# Patient Record
Sex: Female | Born: 1990 | Race: Black or African American | Hispanic: No | Marital: Single | Smoking: Never smoker
Health system: Southern US, Community
[De-identification: ages and names within clinical notes are randomized; demographics above are authoritative.]

## PROBLEM LIST (undated history)

## (undated) DIAGNOSIS — K5190 Ulcerative colitis, unspecified, without complications: Secondary | ICD-10-CM

## (undated) DIAGNOSIS — D649 Anemia, unspecified: Secondary | ICD-10-CM

## (undated) DIAGNOSIS — F909 Attention-deficit hyperactivity disorder, unspecified type: Secondary | ICD-10-CM

## (undated) DIAGNOSIS — N83209 Unspecified ovarian cyst, unspecified side: Secondary | ICD-10-CM

## (undated) DIAGNOSIS — K219 Gastro-esophageal reflux disease without esophagitis: Secondary | ICD-10-CM

## (undated) DIAGNOSIS — N809 Endometriosis, unspecified: Secondary | ICD-10-CM

## (undated) DIAGNOSIS — F32A Depression, unspecified: Secondary | ICD-10-CM

## (undated) DIAGNOSIS — F419 Anxiety disorder, unspecified: Secondary | ICD-10-CM

## (undated) DIAGNOSIS — N390 Urinary tract infection, site not specified: Secondary | ICD-10-CM

## (undated) HISTORY — DX: Ulcerative colitis, unspecified, without complications: K51.90

## (undated) HISTORY — PX: HEMORRHOID BANDING: SHX5850

## (undated) HISTORY — DX: Attention-deficit hyperactivity disorder, unspecified type: F90.9

## (undated) HISTORY — PX: THERAPEUTIC ABORTION: SHX798

## (undated) HISTORY — DX: Endometriosis, unspecified: N80.9

## (undated) HISTORY — PX: NO PAST SURGERIES: SHX2092

---

## 2011-01-01 ENCOUNTER — Emergency Department (HOSPITAL_COMMUNITY)
Admission: EM | Admit: 2011-01-01 | Discharge: 2011-01-01 | Discharge status: Against Medical Advice | Payer: BC Managed Care – PPO | Attending: Emergency Medicine | Admitting: Emergency Medicine

## 2011-01-01 DIAGNOSIS — R198 Other specified symptoms and signs involving the digestive system and abdomen: Secondary | ICD-10-CM | POA: Insufficient documentation

## 2011-01-16 ENCOUNTER — Ambulatory Visit (INDEPENDENT_AMBULATORY_CARE_PROVIDER_SITE_OTHER): Payer: BC Managed Care – PPO

## 2011-01-16 DIAGNOSIS — Z309 Encounter for contraceptive management, unspecified: Secondary | ICD-10-CM

## 2011-03-14 ENCOUNTER — Ambulatory Visit (INDEPENDENT_AMBULATORY_CARE_PROVIDER_SITE_OTHER): Payer: BC Managed Care – PPO | Admitting: Family Medicine

## 2011-03-14 VITALS — BP 124/76 | HR 72 | Temp 98.0°F | Resp 16 | Ht 64.5 in | Wt 245.2 lb

## 2011-03-14 DIAGNOSIS — Z0000 Encounter for general adult medical examination without abnormal findings: Secondary | ICD-10-CM

## 2011-03-14 LAB — POCT URINALYSIS DIPSTICK
Blood, UA: NEGATIVE
Nitrite, UA: NEGATIVE
Protein, UA: 30
Urobilinogen, UA: 1
pH, UA: 7

## 2011-03-14 LAB — POCT UA - MICROSCOPIC ONLY
Casts, Ur, LPF, POC: NEGATIVE
Crystals, Ur, HPF, POC: NEGATIVE

## 2011-03-14 LAB — POCT URINE PREGNANCY: Preg Test, Ur: NEGATIVE

## 2011-03-14 MED ORDER — NORGESTIMATE-ETH ESTRADIOL 0.25-35 MG-MCG PO TABS: 1.0000 | ORAL_TABLET | Freq: Every day | ORAL | Status: DC

## 2011-03-14 NOTE — Progress Notes (Deleted)
  Subjective:    Patient ID: Robin Mora, female    DOB: [birth year], 21 y.o.   MRN: [id number]  HPI Patient presents for CPE.  See pink intake form(to be scanned)   Review of Systems     Objective:   Physical Exam  Nursing note and vitals reviewed. Constitutional: She is oriented to person, place, and time. She appears well-developed and well-nourished.  HENT:  Head: Normocephalic and atraumatic.  Right Ear: Tympanic membrane and external ear normal.  Left Ear: Tympanic membrane and external ear normal.  Nose: Nose normal.  Mouth/Throat: Oropharynx is clear and moist and mucous membranes are normal.  Eyes: Conjunctivae, EOM and lids are normal. Pupils are equal, round, and reactive to light.  Neck: Normal range of motion. Neck supple. No edema present. No thyromegaly present.  Cardiovascular: Normal rate, regular rhythm, normal heart sounds and normal pulses.   Pulmonary/Chest: Effort normal and breath sounds normal.  Abdominal: Soft. Bowel sounds are normal. There is no tenderness.  Genitourinary:       No breast masses                 Musculoskeletal: Normal range of motion.  Lymphadenopathy:    She has no cervical adenopathy.  Neurological: She is alert and oriented to person, place, and time. She has normal strength and normal reflexes. No sensory deficit. Gait normal.  Skin: Skin is warm and dry.  Psychiatric: She has a normal mood and affect.          Assessment & Plan:  CPE/  PPD, follow up 48-72 hours Sprintec x 1 year Form completion

## 2011-03-14 NOTE — Progress Notes (Signed)
  Subjective:    Patient ID: Robin Mora, female    DOB: 1990/12/05, 20 y.o.   MRN: [id number]  HPI Presents for PE for employment.   Requests birth control(never filled rx for sprintec in 12/12 now unable to locate rx).  Recent STI evaluation negative.  New job requiring form completion and ppd   Review of Systems  Constitutional: Negative.   Respiratory: Negative.   Cardiovascular: Negative.   Gastrointestinal: Negative.   Genitourinary: Negative.   Musculoskeletal: Negative.        Objective:   Physical Exam  Constitutional: She is oriented to person, place, and time. She appears well-developed and well-nourished.  HENT:  Head: Normocephalic and atraumatic.  Right Ear: External ear normal.  Left Ear: External ear normal.  Mouth/Throat: Oropharynx is clear and moist.  Eyes: Conjunctivae and EOM are normal. Pupils are equal, round, and reactive to light.  Neck: Normal range of motion. Neck supple. No thyromegaly present.  Cardiovascular: Normal rate, regular rhythm and normal heart sounds.   Pulmonary/Chest: Effort normal and breath sounds normal.  Abdominal: Soft. Bowel sounds are normal. There is no hepatosplenomegaly. There is no tenderness.  Musculoskeletal: Normal range of motion.  Lymphadenopathy:    She has no cervical adenopathy.  Neurological: She is alert and oriented to person, place, and time.  Skin: Skin is warm and dry.  Psychiatric: She has a normal mood and affect.          Assessment & Plan:  CPE  PPD placed, follow up in 48-72 hr Form completion Sprintec, [place] x 40yr Ant. guidance

## 2011-03-16 ENCOUNTER — Ambulatory Visit (INDEPENDENT_AMBULATORY_CARE_PROVIDER_SITE_OTHER): Payer: BC Managed Care – PPO

## 2011-03-16 DIAGNOSIS — Z111 Encounter for screening for respiratory tuberculosis: Secondary | ICD-10-CM

## 2011-03-16 LAB — TB SKIN TEST: TB Skin Test: NEGATIVE mm

## 2012-09-04 ENCOUNTER — Ambulatory Visit (INDEPENDENT_AMBULATORY_CARE_PROVIDER_SITE_OTHER): Payer: BC Managed Care – PPO | Admitting: Physician Assistant

## 2012-09-04 VITALS — BP 110/74 | HR 59 | Temp 97.9°F | Resp 18 | Ht 64.0 in | Wt 232.0 lb

## 2012-09-04 DIAGNOSIS — N898 Other specified noninflammatory disorders of vagina: Secondary | ICD-10-CM

## 2012-09-04 DIAGNOSIS — Z0000 Encounter for general adult medical examination without abnormal findings: Secondary | ICD-10-CM

## 2012-09-04 DIAGNOSIS — N760 Acute vaginitis: Secondary | ICD-10-CM

## 2012-09-04 DIAGNOSIS — B9689 Other specified bacterial agents as the cause of diseases classified elsewhere: Secondary | ICD-10-CM

## 2012-09-04 LAB — POCT WET PREP WITH KOH
Clue Cells Wet Prep HPF POC: 100
KOH Prep POC: NEGATIVE
Trichomonas, UA: NEGATIVE
Yeast Wet Prep HPF POC: NEGATIVE

## 2012-09-04 MED ORDER — METRONIDAZOLE 500 MG PO TABS: 500.0000 mg | ORAL_TABLET | Freq: Two times a day (BID) | ORAL | Status: DC

## 2012-09-04 NOTE — Progress Notes (Signed)
  Subjective:    Patient ID: Robin Mora, female    DOB: 11/12/1990, 22 y.o.   MRN: [id number]  HPI 22 year old female presents for CPE and TB screening with paperwork completion for work.  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  Plans to commute from [place] but is excited.  No known medical problems. Is requesting a pap today - has had one on the past but believes it has been at least 1 year.  No other concerns or complaints today.      Review of Systems  Constitutional: Negative.   HENT: Negative.   Eyes: Negative.   Respiratory: Negative.   Cardiovascular: Negative.   Gastrointestinal: Negative.   Endocrine: Negative.   Genitourinary: Negative.   Musculoskeletal: Negative.   Skin: Negative.   Allergic/Immunologic: Negative.   Neurological: Negative.   Hematological: Negative.   Psychiatric/Behavioral: Negative.        Objective:   Physical Exam  Constitutional: She is oriented to person, place, and time. She appears well-developed and well-nourished.  HENT:  Head: Normocephalic and atraumatic.  Right Ear: Hearing, tympanic membrane, external ear and ear canal normal.  Left Ear: Hearing, tympanic membrane, external ear and ear canal normal.  Mouth/Throat: Oropharynx is clear and moist. No oropharyngeal exudate.  Eyes: Conjunctivae and EOM are normal. Pupils are equal, round, and reactive to light.  Neck: Neck supple. No thyromegaly present.  Cardiovascular: Normal rate and normal heart sounds.   Pulmonary/Chest: Effort normal and breath sounds normal.  Abdominal: Soft. Bowel sounds are normal. There is no tenderness. There is no rebound and no guarding.  Genitourinary: Vagina normal and uterus normal. Pelvic exam was performed with patient supine. Cervix exhibits discharge (thin, white). Cervix exhibits no motion tenderness and no friability. Right adnexum displays no mass, no tenderness and no fullness. Left adnexum displays no mass,  no tenderness and no fullness.  Lymphadenopathy:    She has no cervical adenopathy.  Neurological: She is alert and oriented to person, place, and time.  Psychiatric: She has a normal mood and affect. Her behavior is normal. Judgment and thought content normal.      Results for orders placed in visit on 09/04/12  POCT WET PREP WITH KOH      Result Value Range   Trichomonas, UA Negative     Clue Cells Wet Prep HPF POC 100%     Epithelial Wet Prep HPF POC 5-20     Yeast Wet Prep HPF POC neg     Bacteria Wet Prep HPF POC 4+     RBC Wet Prep HPF POC 0-1     WBC Wet Prep HPF POC 0-2     KOH Prep POC Negative         Assessment & Plan:  Routine general medical examination at a health care facility - Plan: Pap IG, CT/NG w/ reflex HPV when ASC-U  Leukorrhea, not specified as infective - Plan: POCT Wet Prep with KOH  Bacterial vaginosis - Plan: metroNIDAZOLE (FLAGYL) 500 MG tablet  Forms completed TB screening letter printed Immunizations up to date For BV, Flagyl 500 mg bid x 7 days - no ETOH Follow up as needed.

## 2012-09-04 NOTE — Progress Notes (Signed)
  Tuberculosis Risk Questionnaire  1. Were you born outside the [place] in one of the following parts of the world: [place], [place], [place], [place] or Eastern [place]?  No  2. Have you traveled outside the [place] and lived for more than one month in one of the following parts of the world: [place], [place], [place], [place] or [place]?  No  3. Do you have a compromised immune system such as from any of the following conditions:HIV/AIDS, organ or bone marrow transplantation, diabetes, immunosuppressive medicines (e.g. Prednisone, Remicaide), leukemia, lymphoma, cancer of the head or neck, gastrectomy or jejunal bypass, end-stage renal disease (on dialysis), or silicosis?  No   4. Have you ever or do you plan on working in: a residential care center, a health care facility, a jail or prison or homeless shelter?  No  5. Have you ever: injected illegal drugs, used crack cocaine, lived in a homeless shelter  or been in jail or prison?   No  6. Have you ever been exposed to anyone with infectious tuberculosis?  No   Tuberculosis Symptom Questionnaire  Do you currently have any of the following symptoms?  1. Unexplained cough lasting more than 3 weeks? No  2. Unexplained fever lasting more than 3 weeks. No   3. Night Sweats (sweating that leaves the bedclothes and sheets wet)   No  4. Shortness of Breath No  5. Chest Pain No  6. Unintentional weight loss  No  7. Unexplained fatigue (very tired for no reason) No  

## 2012-09-05 LAB — PAP IG, CT-NG, RFX HPV ASCU
Chlamydia Probe Amp: NEGATIVE
GC Probe Amp: NEGATIVE

## 2012-09-20 ENCOUNTER — Ambulatory Visit (INDEPENDENT_AMBULATORY_CARE_PROVIDER_SITE_OTHER): Payer: BC Managed Care – PPO | Admitting: Physician Assistant

## 2012-09-20 VITALS — BP 118/74 | HR 89 | Temp 98.0°F | Resp 17 | Ht 65.5 in | Wt 228.0 lb

## 2012-09-20 DIAGNOSIS — B373 Candidiasis of vulva and vagina: Secondary | ICD-10-CM

## 2012-09-20 DIAGNOSIS — N899 Noninflammatory disorder of vagina, unspecified: Secondary | ICD-10-CM

## 2012-09-20 DIAGNOSIS — N898 Other specified noninflammatory disorders of vagina: Secondary | ICD-10-CM

## 2012-09-20 LAB — POCT WET PREP WITH KOH: Yeast Wet Prep HPF POC: NEGATIVE

## 2012-09-20 MED ORDER — FLUCONAZOLE 150 MG PO TABS: 150.0000 mg | ORAL_TABLET | Freq: Once | ORAL | Status: DC

## 2012-09-20 NOTE — Progress Notes (Signed)
  Subjective:    Patient ID: Robin Mora, female    DOB: [birth year], 22 y.o.   MRN: [id number]  HPI   Robin Mora is a very pleasant 22 yr old female here with concern for yeast infection.  Currently experiencing thick, white vaginal discharge associated with irritation, itching, and burning.  She has had yeast infections in the past, and this feels similar to previous episodes.  She was seen here approx 2 wks ago for BV.  Noticed current symptoms a day or so after she completed abx fo BV.  Tried OTC cream but that caused further irritation and did not resolve symptoms.    LMP 08/25/12.  No concern for STI.  Pap at last visit neg for CT/NG  Review of Systems  Constitutional: Negative.   HENT: Negative.   Respiratory: Negative.   Cardiovascular: Negative.   Gastrointestinal: Negative for nausea, vomiting and abdominal pain.  Genitourinary: Positive for vaginal discharge. Negative for dysuria, frequency, hematuria, vaginal bleeding and menstrual problem.  Musculoskeletal: Negative.   Skin: Negative.   Neurological: Negative.        Objective:   Physical Exam  Vitals reviewed. Constitutional: She is oriented to person, place, and time. She appears well-developed and well-nourished. No distress.  HENT:  Head: Normocephalic and atraumatic.  Eyes: Conjunctivae are normal. No scleral icterus.  Cardiovascular: Normal rate, regular rhythm and normal heart sounds.   Pulmonary/Chest: Effort normal and breath sounds normal. She has no wheezes. She has no rales.  Abdominal: Soft. There is no tenderness.  Neurological: She is alert and oriented to person, place, and time.  Skin: Skin is warm and dry.  Psychiatric: She has a normal mood and affect. Her behavior is normal.    Self-collected wet prep:  Results for orders placed in visit on 09/20/12  POCT WET PREP WITH KOH      Result Value Range   Trichomonas, UA Negative     Clue Cells Wet Prep HPF POC 1-2     Epithelial Wet Prep HPF POC 10-tntc      Yeast Wet Prep HPF POC neg     Bacteria Wet Prep HPF POC 3+     RBC Wet Prep HPF POC neg     WBC Wet Prep HPF POC 0-4     KOH Prep POC Negative         Assessment & Plan:  Vaginal candidiasis - Plan: fluconazole (DIFLUCAN) 150 MG tablet  Vaginal discharge - Plan: POCT Wet Prep with KOH  Vaginal irritation - Plan: POCT Wet Prep with KOH   Robin Mora is a very pleasant 22 yr old female with vaginal candidiasis.  Wet prep does not show yeast, but symptoms are certainly suggestive of yeast infection, esp with recent completion of abx.  Will treat with diflucan x 1, repeat in 1wk if needed.  Discussed RTC precautions with pt who understands and is in agreement with this plan.

## 2012-09-20 NOTE — Patient Instructions (Addendum)
Take one fluconazole pill today.  Repeat in 1 wk if needed.  Please let me know if symptoms are worsening or not improving.   Monilial Vaginitis Vaginitis in a soreness, swelling and redness (inflammation) of the vagina and vulva. Monilial vaginitis is not a sexually transmitted infection. CAUSES  Yeast vaginitis is caused by yeast (candida) that is normally found in your vagina. With a yeast infection, the candida has overgrown in number to a point that upsets the chemical balance. SYMPTOMS   White, thick vaginal discharge.  Swelling, itching, redness and irritation of the vagina and possibly the lips of the vagina (vulva).  Burning or painful urination.  Painful intercourse. DIAGNOSIS  Things that may contribute to monilial vaginitis are:  Postmenopausal and virginal states.  Pregnancy.  Infections.  Being tired, sick or stressed, especially if you had monilial vaginitis in the past.  Diabetes. Good control will help lower the chance.  Birth control pills.  Tight fitting garments.  Using bubble bath, feminine sprays, douches or deodorant tampons.  Taking certain medications that kill germs (antibiotics).  Sporadic recurrence can occur if you become ill. TREATMENT  Your caregiver will give you medication.  There are several kinds of anti monilial vaginal creams and suppositories specific for monilial vaginitis. For recurrent yeast infections, use a suppository or cream in the vagina 2 times a week, or as directed.  Anti-monilial or steroid cream for the itching or irritation of the vulva may also be used. Get your caregiver's permission.  Painting the vagina with methylene blue solution may help if the monilial cream does not work.  Eating yogurt may help prevent monilial vaginitis. HOME CARE INSTRUCTIONS   Finish all medication as prescribed.  Do not have sex until treatment is completed or after your caregiver tells you it is okay.  Take warm sitz baths.  Do  not douche.  Do not use tampons, especially scented ones.  Wear cotton underwear.  Avoid tight pants and panty hose.  Tell your sexual partner that you have a yeast infection. They should go to their caregiver if they have symptoms such as mild rash or itching.  Your sexual partner should be treated as well if your infection is difficult to eliminate.  Practice safer sex. Use condoms.  Some vaginal medications cause latex condoms to fail. Vaginal medications that harm condoms are:  Cleocin cream.  Butoconazole (Femstat).  Terconazole (Terazol) vaginal suppository.  Miconazole (Monistat) (may be purchased over the counter). SEEK MEDICAL CARE IF:   You have a temperature by mouth above 102 F (38.9 C).  The infection is getting worse after 2 days of treatment.  The infection is not getting better after 3 days of treatment.  You develop blisters in or around your vagina.  You develop vaginal bleeding, and it is not your menstrual period.  You have pain when you urinate.  You develop intestinal problems.  You have pain with sexual intercourse. Document Released: 11/01/2004 Document Revised: 04/16/2011 Document Reviewed: 07/16/2008 [facility] Patient Information 2014 [place], [place].

## 2012-10-11 ENCOUNTER — Ambulatory Visit (INDEPENDENT_AMBULATORY_CARE_PROVIDER_SITE_OTHER): Payer: BC Managed Care – PPO | Admitting: Internal Medicine

## 2012-10-11 VITALS — BP 118/72 | HR 78 | Temp 98.1°F | Resp 18 | Ht 64.5 in | Wt 226.0 lb

## 2012-10-11 DIAGNOSIS — N390 Urinary tract infection, site not specified: Secondary | ICD-10-CM

## 2012-10-11 DIAGNOSIS — R300 Dysuria: Secondary | ICD-10-CM

## 2012-10-11 LAB — POCT URINALYSIS DIPSTICK
Bilirubin, UA: NEGATIVE
Glucose, UA: NEGATIVE
Ketones, UA: NEGATIVE
Spec Grav, UA: 1.025
Urobilinogen, UA: 2

## 2012-10-11 MED ORDER — CIPROFLOXACIN HCL 500 MG PO TABS: 500.0000 mg | ORAL_TABLET | Freq: Two times a day (BID) | ORAL | Status: DC

## 2012-10-11 MED ORDER — PHENAZOPYRIDINE HCL 200 MG PO TABS: 200.0000 mg | ORAL_TABLET | Freq: Three times a day (TID) | ORAL | Status: DC | PRN

## 2012-10-11 NOTE — Patient Instructions (Signed)
Urinary Tract Infection  Urinary tract infections (UTIs) can develop anywhere along your urinary tract. Your urinary tract is your body's drainage system for removing wastes and extra water. Your urinary tract includes two kidneys, two ureters, a bladder, and a urethra. Your kidneys are a pair of bean-shaped organs. Each kidney is about the size of your fist. They are located below your ribs, one on each side of your spine.  CAUSES  Infections are caused by microbes, which are microscopic organisms, including fungi, viruses, and bacteria. These organisms are so small that they can only be seen through a microscope. Bacteria are the microbes that most commonly cause UTIs.  SYMPTOMS   Symptoms of UTIs may vary by age and gender of the patient and by the location of the infection. Symptoms in young women typically include a frequent and intense urge to urinate and a painful, burning feeling in the bladder or urethra during urination. Older women and men are more likely to be tired, shaky, and weak and have muscle aches and abdominal pain. A fever may mean the infection is in your kidneys. Other symptoms of a kidney infection include pain in your back or sides below the ribs, nausea, and vomiting.  DIAGNOSIS  To diagnose a UTI, your caregiver will ask you about your symptoms. Your caregiver also will ask to provide a urine sample. The urine sample will be tested for bacteria and white blood cells. White blood cells are made by your body to help fight infection.  TREATMENT   Typically, UTIs can be treated with medication. Because most UTIs are caused by a bacterial infection, they usually can be treated with the use of antibiotics. The choice of antibiotic and length of treatment depend on your symptoms and the type of bacteria causing your infection.  HOME CARE INSTRUCTIONS   If you were prescribed antibiotics, take them exactly as your caregiver instructs you. Finish the medication even if you feel better after you  have only taken some of the medication.   Drink enough water and fluids to keep your urine clear or pale yellow.   Avoid caffeine, tea, and carbonated beverages. They tend to irritate your bladder.   Empty your bladder often. Avoid holding urine for long periods of time.   Empty your bladder before and after sexual intercourse.   After a bowel movement, women should cleanse from front to back. Use each tissue only once.  SEEK MEDICAL CARE IF:    You have back pain.   You develop a fever.   Your symptoms do not begin to resolve within 3 days.  SEEK IMMEDIATE MEDICAL CARE IF:    You have severe back pain or lower abdominal pain.   You develop chills.   You have nausea or vomiting.   You have continued burning or discomfort with urination.  MAKE SURE YOU:    Understand these instructions.   Will watch your condition.   Will get help right away if you are not doing well or get worse.  Document Released: 11/01/2004 Document Revised: 07/24/2011 Document Reviewed: 03/02/2011  [employer] Patient Information 2014 [employer].

## 2012-10-11 NOTE — Progress Notes (Signed)
  Subjective:    Patient ID: Robin Mora, female    DOB: 1990-10-14, 22 y.o.   MRN: [id number]  HPI Burning , frequency, urgency for 2 days. No fever, chills, or back pain Hx of only a few utis.   Review of Systems     Objective:   Physical Exam  Vitals reviewed. Constitutional: She is oriented to person, place, and time. She appears well-developed and well-nourished. No distress.  Eyes: No scleral icterus.  Pulmonary/Chest: Effort normal.  Abdominal: There is tenderness.  Neurological: She is alert and oriented to person, place, and time. She exhibits normal muscle tone. Coordination normal.  Skin: No rash noted.  Psychiatric: She has a normal mood and affect.     Results for orders placed in visit on 10/11/12  POCT URINALYSIS DIPSTICK      Result Value Range   Color, UA yellow     Clarity, UA clear     Glucose, UA neg     Bilirubin, UA neg     Ketones, UA neg     Spec Grav, UA 1.025     Blood, UA large     pH, UA 8.0     Protein, UA >=300     Urobilinogen, UA 2.0     Nitrite, UA neg     Leukocytes, UA small (1+)      CS urine    Assessment & Plan:  UTI/Cipro

## 2012-10-13 LAB — URINE CULTURE: Colony Count: 95000

## 2012-12-11 ENCOUNTER — Other Ambulatory Visit: Payer: Self-pay

## 2013-01-06 ENCOUNTER — Encounter: Payer: Self-pay | Admitting: Physician Assistant

## 2013-01-06 ENCOUNTER — Ambulatory Visit (INDEPENDENT_AMBULATORY_CARE_PROVIDER_SITE_OTHER): Payer: BC Managed Care – PPO | Admitting: Physician Assistant

## 2013-01-06 VITALS — BP 100/72 | HR 84 | Temp 98.9°F | Resp 16 | Ht 65.0 in | Wt 222.6 lb

## 2013-01-06 DIAGNOSIS — B9689 Other specified bacterial agents as the cause of diseases classified elsewhere: Secondary | ICD-10-CM

## 2013-01-06 DIAGNOSIS — Z113 Encounter for screening for infections with a predominantly sexual mode of transmission: Secondary | ICD-10-CM

## 2013-01-06 DIAGNOSIS — N898 Other specified noninflammatory disorders of vagina: Secondary | ICD-10-CM

## 2013-01-06 DIAGNOSIS — N760 Acute vaginitis: Secondary | ICD-10-CM

## 2013-01-06 LAB — POCT WET PREP WITH KOH
RBC Wet Prep HPF POC: NEGATIVE
Trichomonas, UA: NEGATIVE

## 2013-01-06 MED ORDER — METRONIDAZOLE 500 MG PO TABS: 500.0000 mg | ORAL_TABLET | Freq: Two times a day (BID) | ORAL | Status: DC

## 2013-01-06 NOTE — Patient Instructions (Signed)
Take the metronidazole as directed.  DO NOT DRINK ALCOHOL WHILE TAKING THIS MEDICATION or for 48 hours after your last dose.  Be careful with soaps, lotions, scented products, etc as these can trigger BV.  Please let us know if any symptoms are worsening or not improving.    Think about trying Nexplanon for birth control - I can put in a referral to OBGYN if you are interested in this.  At the very least, use condoms with EVERY encounter  I will let you know when the rest of your labs are back and if we need treat anything at that time   Bacterial Vaginosis Bacterial vaginosis (BV) is a vaginal infection where the normal balance of bacteria in the vagina is disrupted. The normal balance is then replaced by an overgrowth of certain bacteria. There are several different kinds of bacteria that can cause BV. BV is the most common vaginal infection in women of childbearing age. CAUSES   The cause of BV is not fully understood. BV develops when there is an increase or imbalance of harmful bacteria.  Some activities or behaviors can upset the normal balance of bacteria in the vagina and put women at increased risk including:  Having a new sex partner or multiple sex partners.  Douching.  Using an intrauterine device (IUD) for contraception.  It is not clear what role sexual activity plays in the development of BV. However, women that have never had sexual intercourse are rarely infected with BV. Women do not get BV from toilet seats, bedding, swimming pools or from touching objects around them.  SYMPTOMS   Grey vaginal discharge.  A fish-like odor with discharge, especially after sexual intercourse.  Itching or burning of the vagina and vulva.  Burning or pain with urination.  Some women have no signs or symptoms at all. DIAGNOSIS  Your caregiver must examine the vagina for signs of BV. Your caregiver will perform lab tests and look at the sample of vaginal fluid through a microscope.  They will look for bacteria and abnormal cells (clue cells), a pH test higher than 4.5, and a positive amine test all associated with BV.  RISKS AND COMPLICATIONS   Pelvic inflammatory disease (PID).  Infections following gynecology surgery.  Developing HIV.  Developing herpes virus. TREATMENT  Sometimes BV will clear up without treatment. However, all women with symptoms of BV should be treated to avoid complications, especially if gynecology surgery is planned. Female partners generally do not need to be treated. However, BV may spread between female sex partners so treatment is helpful in preventing a recurrence of BV.   BV may be treated with antibiotics. The antibiotics come in either pill or vaginal cream forms. Either can be used with nonpregnant or pregnant women, but the recommended dosages differ. These antibiotics are not harmful to the baby.  BV can recur after treatment. If this happens, a second round of antibiotics will often be prescribed.  Treatment is important for pregnant women. If not treated, BV can cause a premature delivery, especially for a pregnant woman who had a premature birth in the past. All pregnant women who have symptoms of BV should be checked and treated.  For chronic reoccurrence of BV, treatment with a type of prescribed gel vaginally twice a week is helpful. HOME CARE INSTRUCTIONS   Finish all medication as directed by your caregiver.  Do not have sex until treatment is completed.  Tell your sexual partner that you have a vaginal infection.  They should see their caregiver and be treated if they have problems, such as a mild rash or itching.  Practice safe sex. Use condoms. Only have 1 sex partner. PREVENTION  Basic prevention steps can help reduce the risk of upsetting the natural balance of bacteria in the vagina and developing BV:  Do not have sexual intercourse (be abstinent).  Do not douche.  Use all of the medicine prescribed for treatment  of BV, even if the signs and symptoms go away.  Tell your sex partner if you have BV. That way, they can be treated, if needed, to prevent reoccurrence. SEEK MEDICAL CARE IF:   Your symptoms are not improving after 3 days of treatment.  You have increased discharge, pain, or fever. MAKE SURE YOU:   Understand these instructions.  Will watch your condition.  Will get help right away if you are not doing well or get worse. FOR MORE INFORMATION  Division of STD Prevention (DSTDP), Centers for Disease Control and Prevention: [employer] American Social Health Association (ASHA): www.ashastd.org  Document Released: 01/22/2005 Document Revised: 04/16/2011 Document Reviewed: 09/03/2012 [facility] Patient Information 2014 [place], [place].

## 2013-01-06 NOTE — Progress Notes (Signed)
Subjective:    Patient ID: Robin Mora, female    DOB: May 11, 1990, 22 y.o.   MRN: [id number]  HPI   Ms. Mcquilkin is a very pleasant 22 yr old female here requesting STD testing.  Reports some vaginal discharge, irritation, and odor.  Noted this over 2 wks ago when she had her period.  Patient's last menstrual period was 12/18/2012.  Denies NV, fever, abd pain, abnormal bleeding, urinary symptoms.  Has had both yeast and BV in the past.  Last tested for STIs over a year ago, never positive.  She is currently sexually active with 1 female partner.  They do not use condoms.  She is not using anything for contraception - stopped about a year ago.  Had significant weight gain with depo.  Tried sprintec and another pill - both caused breast pain.    Review of Systems  Constitutional: Negative for fever and chills.  Respiratory: Negative.   Cardiovascular: Negative.   Gastrointestinal: Negative for nausea, vomiting and abdominal pain.  Genitourinary: Positive for vaginal discharge and vaginal pain (irritation). Negative for dysuria, frequency and hematuria.  Musculoskeletal: Negative.   Skin: Negative.        Objective:   Physical Exam  Vitals reviewed. Constitutional: She is oriented to person, place, and time. She appears well-developed and well-nourished. No distress.  HENT:  Head: Normocephalic and atraumatic.  Eyes: Conjunctivae are normal. No scleral icterus.  Pulmonary/Chest: Effort normal.  Genitourinary: Uterus normal. There is no rash, tenderness or lesion on the right labia. There is no rash, tenderness or lesion on the left labia. Cervix exhibits no motion tenderness. Right adnexum displays no mass, no tenderness and no fullness. Left adnexum displays no mass, no tenderness and no fullness. Vaginal discharge (thick, white) found.  Neurological: She is alert and oriented to person, place, and time.  Skin: Skin is warm and dry.  Psychiatric: She has a normal mood and affect. Her behavior is  normal.    Results for orders placed in visit on 01/06/13  POCT WET PREP WITH KOH      Result Value Range   Trichomonas, UA Negative     Clue Cells Wet Prep HPF POC 4-6     Epithelial Wet Prep HPF POC 6-12     Yeast Wet Prep HPF POC neg     Bacteria Wet Prep HPF POC 2+     RBC Wet Prep HPF POC neg     WBC Wet Prep HPF POC 2-10     KOH Prep POC Negative         Assessment & Plan:  Bacterial vaginosis - Plan: metroNIDAZOLE (FLAGYL) 500 MG tablet  Vaginal discharge - Plan: POCT Wet Prep with KOH, GC/Chlamydia Probe Amp  Screen for STD (sexually transmitted disease) - Plan: RPR, HIV antibody   Ms. Weinfeld is a very pleasant 22 yr old female here with bacterial vaginosis.  Will treat with flagyl x 7 days.  Discussed possible triggers of BV and prevention strategies.  Genprobe, HIV, RPR pending - will follow up and treat if necessary.  We discussed contraceptive options.  I think Nexplanon would be a very good option for her as she has had intolerable effects from other methods.  Gave pt edu material about nexplanon, she will call if she would like me to place a referral to OBGYN.   Meds ordered this encounter  Medications  . metroNIDAZOLE (FLAGYL) 500 MG tablet    Sig: Take 1 tablet (500 mg total)  by mouth 2 (two) times daily with a meal. DO NOT CONSUME ALCOHOL WHILE TAKING THIS MEDICATION.    Dispense:  14 tablet    Refill:  0    Order Specific Question:  Supervising Provider    Answer:  Ethelda Chick [[id number]]    Loleta Dicker MHS, PA-C [facility] 12/2/20148:00 PM

## 2013-01-07 LAB — GC/CHLAMYDIA PROBE AMP
CT Probe RNA: NEGATIVE
GC Probe RNA: NEGATIVE

## 2013-01-07 LAB — HIV ANTIBODY (ROUTINE TESTING W REFLEX): HIV: NONREACTIVE

## 2013-01-07 LAB — RPR

## 2013-05-07 ENCOUNTER — Encounter: Payer: Self-pay | Admitting: Family Medicine

## 2013-05-07 ENCOUNTER — Ambulatory Visit (INDEPENDENT_AMBULATORY_CARE_PROVIDER_SITE_OTHER): Payer: BC Managed Care – PPO | Admitting: Family Medicine

## 2013-05-07 VITALS — BP 118/70 | HR 71 | Temp 99.3°F | Resp 16 | Ht 64.5 in | Wt 235.6 lb

## 2013-05-07 DIAGNOSIS — N62 Hypertrophy of breast: Secondary | ICD-10-CM | POA: Insufficient documentation

## 2013-05-07 DIAGNOSIS — E669 Obesity, unspecified: Secondary | ICD-10-CM | POA: Insufficient documentation

## 2013-05-07 DIAGNOSIS — Z130 Encounter for screening for diseases of the blood and blood-forming organs and certain disorders involving the immune mechanism: Secondary | ICD-10-CM

## 2013-05-07 HISTORY — DX: Hypertrophy of breast: N62

## 2013-05-07 LAB — CBC WITH DIFFERENTIAL/PLATELET
BASOS PCT: 1 % (ref 0–1)
Basophils Absolute: 0 10*3/uL (ref 0.0–0.1)
EOS ABS: 0.1 10*3/uL (ref 0.0–0.7)
Eosinophils Relative: 3 % (ref 0–5)
HEMATOCRIT: 43.5 % (ref 36.0–46.0)
HEMOGLOBIN: 14.5 g/dL (ref 12.0–15.0)
Lymphocytes Relative: 35 % (ref 12–46)
Lymphs Abs: 1.5 10*3/uL (ref 0.7–4.0)
MCH: 28.8 pg (ref 26.0–34.0)
MCHC: 33.3 g/dL (ref 30.0–36.0)
MCV: 86.3 fL (ref 78.0–100.0)
MONO ABS: 0.4 10*3/uL (ref 0.1–1.0)
MONOS PCT: 8 % (ref 3–12)
Neutro Abs: 2.3 10*3/uL (ref 1.7–7.7)
Neutrophils Relative %: 53 % (ref 43–77)
Platelets: 346 10*3/uL (ref 150–400)
RBC: 5.04 MIL/uL (ref 3.87–5.11)
RDW: 13.5 % (ref 11.5–15.5)
WBC: 4.4 10*3/uL (ref 4.0–10.5)

## 2013-05-07 NOTE — Progress Notes (Signed)
S:  This 23 y.o. AA female is here for advice re: breast reduction surgery. She started to develop breasts in the 5th grade and had large breasts by the time she was in high school. She has tried weight loss, restricting her calories to 600 per day. 20-pound weight loss Exercise is prohibited by breast size; running or high impact aerobics results in day-after muscle and breasts soreness. She cannot find a sports bra or bathing suit that fits comfortably. Bra size= G. She has low back pain and skin discoloration under breasts and across shoulders where bra straps rest.  PMHx, Surg Hx, Soc and Fam Hx reviewed.  ROS: As per HPI. Negative for fatigue, diaphoresis, CP or tightness, palpitations, SOB or DOE, cold or heat intolerance, HA, dizziness, weakness or syncope.  O: Filed Vitals:   05/07/13 1050  BP: 118/70  Pulse: 71  Temp: 99.3 F (37.4 C)  Resp: 16   GEN: In NAD; WN,WD. HENT: [place]/AT; EOMI w/ clear conj/sclerae. Otherwise unremarkable. NECK: Supple w/o LAN or TMG. COR: RRR. Normal S1 and S2 w/o m/g/r. LUNGS: Normal resp rate and effort. BACK: Spine straight w/ mild paravertebral muscle spasms. SHOULDERS: Indentations where bra straps rest. BREASTS: Large pendulous, hanging over abdomen. Skin discoloration beneath (no signs of infection). NEURO: A&O x 3; CNs intact. Nonfocal. PSYCH: Pleasant and calm; attentive w/ good eye contact. Speech is coherent and thought pattern is normal. Judgment is mature and sound.  A/P: Large breasts - Plan: Ambulatory referral to Plastic Surgery  Obesity, unspecified - Plan: COMPLETE METABOLIC PANEL WITH GFR, Vit D  25 hydroxy (rtn osteoporosis monitoring), Thyroid Panel With TSH  Screening for deficiency anemia - Plan: CBC with Differential

## 2013-05-07 NOTE — Patient Instructions (Signed)
I have ordered a referral to PLASTIC SURGERY for evaluation for breast reduction. Dr. Louisa SecondGerald Truesdale and Dr. Novella OliveWilliam Barber are the two surgeons that come to mind.  Who you see will depend on your insurer. If you have not heard about an appointment within the next 2 weeks, please contact the office.

## 2013-05-08 ENCOUNTER — Encounter: Payer: Self-pay | Admitting: Family Medicine

## 2013-05-08 ENCOUNTER — Other Ambulatory Visit: Payer: Self-pay | Admitting: Family Medicine

## 2013-05-08 LAB — COMPLETE METABOLIC PANEL WITH GFR
ALBUMIN: 4.1 g/dL (ref 3.5–5.2)
ALK PHOS: 55 U/L (ref 39–117)
ALT: 8 U/L (ref 0–35)
AST: 12 U/L (ref 0–37)
BILIRUBIN TOTAL: 0.6 mg/dL (ref 0.2–1.2)
BUN: 11 mg/dL (ref 6–23)
CO2: 27 mEq/L (ref 19–32)
Calcium: 9.4 mg/dL (ref 8.4–10.5)
Chloride: 103 mEq/L (ref 96–112)
Creat: 0.65 mg/dL (ref 0.50–1.10)
GLUCOSE: 88 mg/dL (ref 70–99)
Potassium: 3.9 mEq/L (ref 3.5–5.3)
Sodium: 136 mEq/L (ref 135–145)
Total Protein: 7.3 g/dL (ref 6.0–8.3)

## 2013-05-08 LAB — THYROID PANEL WITH TSH
FREE THYROXINE INDEX: 3.3 (ref 1.0–3.9)
T3 UPTAKE: 37.5 % — AB (ref 22.5–37.0)
T4, Total: 8.7 ug/dL (ref 5.0–12.5)
TSH: 1.276 u[IU]/mL (ref 0.350–4.500)

## 2013-05-08 LAB — VITAMIN D 25 HYDROXY (VIT D DEFICIENCY, FRACTURES): Vit D, 25-Hydroxy: 10 ng/mL — ABNORMAL LOW (ref 30–89)

## 2013-05-08 MED ORDER — ERGOCALCIFEROL 1.25 MG (50000 UT) PO CAPS: 50000.0000 [IU] | ORAL_CAPSULE | ORAL | Status: DC

## 2013-05-13 ENCOUNTER — Telehealth: Payer: Self-pay

## 2013-05-13 MED ORDER — FEXOFENADINE HCL 180 MG PO TABS: 180.0000 mg | ORAL_TABLET | Freq: Every day | ORAL | Status: DC

## 2013-05-13 NOTE — Telephone Encounter (Signed)
  Patient Responses     Chl Mychart After Visit Questionnaire    Question 05/13/2013 8:27 AM   How are you feeling after your recent visit? Happy   Does the recommended course of treatment seem to be helping your symptoms? Sure   Are you experiencing any side effects from your recommended treatment? No   is there anything else you would like to ask your physician? What should I take for my allergies, they are really getting in the way of my daily activities

## 2013-05-13 NOTE — Telephone Encounter (Signed)
I am prescribing allergy medication; it can be purchased OTC but may be less costly w/ prescription. It  Should be available for pick-up later today from pharmacy.  Also advise pt to get OTC saline nasal mist called AYR and follow package directions.

## 2013-05-14 NOTE — Telephone Encounter (Signed)
Left a message for patient to return call.

## 2013-05-18 NOTE — Telephone Encounter (Signed)
Since patient does not return call, have sent my chart message.

## 2013-12-10 ENCOUNTER — Ambulatory Visit (INDEPENDENT_AMBULATORY_CARE_PROVIDER_SITE_OTHER): Payer: BC Managed Care – PPO | Admitting: Emergency Medicine

## 2013-12-10 ENCOUNTER — Other Ambulatory Visit: Payer: Self-pay | Admitting: Emergency Medicine

## 2013-12-10 VITALS — BP 118/70 | HR 86 | Temp 98.3°F | Resp 16 | Ht 65.0 in | Wt 247.0 lb

## 2013-12-10 DIAGNOSIS — B9689 Other specified bacterial agents as the cause of diseases classified elsewhere: Secondary | ICD-10-CM

## 2013-12-10 DIAGNOSIS — R197 Diarrhea, unspecified: Secondary | ICD-10-CM

## 2013-12-10 DIAGNOSIS — R350 Frequency of micturition: Secondary | ICD-10-CM

## 2013-12-10 DIAGNOSIS — N760 Acute vaginitis: Secondary | ICD-10-CM

## 2013-12-10 DIAGNOSIS — A499 Bacterial infection, unspecified: Secondary | ICD-10-CM

## 2013-12-10 DIAGNOSIS — N898 Other specified noninflammatory disorders of vagina: Secondary | ICD-10-CM

## 2013-12-10 LAB — POCT URINALYSIS DIPSTICK
Bilirubin, UA: NEGATIVE
Glucose, UA: NEGATIVE
Leukocytes, UA: NEGATIVE
Nitrite, UA: NEGATIVE
PROTEIN UA: NEGATIVE
RBC UA: NEGATIVE
Spec Grav, UA: >=1.03
UROBILINOGEN UA: 1
pH, UA: 6

## 2013-12-10 LAB — POCT WET PREP WITH KOH
KOH Prep POC: NEGATIVE
RBC WET PREP PER HPF POC: NEGATIVE
TRICHOMONAS UA: NEGATIVE
Yeast Wet Prep HPF POC: NEGATIVE

## 2013-12-10 LAB — POCT UA - MICROSCOPIC ONLY
CASTS, UR, LPF, POC: NEGATIVE
CRYSTALS, UR, HPF, POC: NEGATIVE
MUCUS UA: NEGATIVE
YEAST UA: NEGATIVE

## 2013-12-10 MED ORDER — METRONIDAZOLE 500 MG PO TABS: 500.0000 mg | ORAL_TABLET | Freq: Two times a day (BID) | ORAL | Status: DC

## 2013-12-10 NOTE — Progress Notes (Signed)
[facility] [address], [place] [id number] [phone]  Date:  12/10/2013   Name:  Robin Mora   DOB:  06/11/1990   MRN:  [id number]  PCP:  Nilda SimmerSMITH,KRISTI, MD    Chief Complaint: Urinary Tract Infection and Diarrhea   History of Present Illness:  Robin Mora is a 23 y.o. very pleasant female patient who presents with the following:  Multiple complaints.  Has urgency and frequency.  No dysuria.  No vaginal discharge or dyspareunia Has several month history of diarrhea. The patient has no complaint of blood, mucous, or pus in her stools. Lactose containing products worsens diarrhea.   4-5 times daily. No abdominal pain.  Some urge incontinence of urine. No travel or sketchy water. No fever or chills. Has white thick vaginal discharge No improvement with over the counter medications or other home remedies.  Denies other complaint or health concern today.   Patient Active Problem List   Diagnosis Date Noted  . Large breasts 05/07/2013  . Obesity, unspecified 05/07/2013    History reviewed. No pertinent past medical history.  History reviewed. No pertinent past surgical history.  History  Substance Use Topics  . Smoking status: Never Smoker   . Smokeless tobacco: Not on file  . Alcohol Use: No    Family History  Problem Relation Age of Onset  . Hyperlipidemia Father   . Hypertension Father   . Sleep apnea Father   . Diabetes Maternal Grandmother   . Diabetes Paternal Grandmother   . Hypertension Paternal Grandmother   . Heart disease Paternal Grandfather   . Hypertension Paternal Grandfather   . Kidney disease Paternal Grandfather   . Diabetes Paternal Grandfather     No Known Allergies  Medication list has been reviewed and updated.  Current Outpatient Prescriptions on File Prior to Visit  Medication Sig Dispense Refill  . ergocalciferol (DRISDOL) 50000 UNITS capsule Take 1 capsule (50,000 Units total) by mouth once a week. 4 capsule  5  . fexofenadine (ALLEGRA) 180 MG tablet Take 1 tablet (180 mg total) by mouth daily. 30 tablet 5   No current facility-administered medications on file prior to visit.    Review of Systems:  As per HPI, otherwise negative.    Physical Examination: Filed Vitals:   12/10/13 1758  BP: 118/70  Pulse: 86  Temp: 98.3 F (36.8 C)  Resp: 16   Filed Vitals:   12/10/13 1758  Height: 5\' 5"  (1.651 m)  Weight: 247 lb (112.038 kg)   Body mass index is 41.1 kg/(m^2). Ideal Body Weight: Weight in (lb) to have BMI = 25: 149.9  GEN: obeste, NAD, Non-toxic, A & O x 3 HEENT: Atraumatic, Normocephalic. Neck supple. No masses, No LAD. Ears and Nose: No external deformity. CV: RRR, No M/G/R. No JVD. No thrill. No extra heart sounds. PULM: CTA B, no wheezes, crackles, rhonchi. No retractions. No resp. distress. No accessory muscle use. ABD: S, NT, ND, +BS. No rebound. No HSM. EXTR: No c/c/e NEURO Normal gait.  PSYCH: Normally interactive. Conversant. Not depressed or anxious appearing.  Calm demeanor.    Assessment and Plan: bv Flagyl   Signed,  Phillips OdorJeffery Ariel Dimitri, MD   Results for orders placed or performed in visit on 12/10/13  POCT urinalysis dipstick  Result Value Ref Range   Color, UA yellow    Clarity, UA cloudy    Glucose, UA neg    Bilirubin, UA neg    Ketones, UA trace  Spec Grav, UA >=1.030    Blood, UA neg    pH, UA 6.0    Protein, UA neg    Urobilinogen, UA 1.0    Nitrite, UA neg    Leukocytes, UA Negative   POCT UA - Microscopic Only  Result Value Ref Range   WBC, Ur, HPF, POC 2-3    RBC, urine, microscopic 0-1    Bacteria, U Microscopic small    Mucus, UA neg    Epithelial cells, urine per micros 5-8    Crystals, Ur, HPF, POC neg    Casts, Ur, LPF, POC neg    Yeast, UA neg   POCT Wet Prep with KOH  Result Value Ref Range   Trichomonas, UA Negative    Clue Cells Wet Prep HPF POC 0-2    Epithelial Wet Prep HPF POC 3-6    Yeast Wet Prep HPF POC neg     Bacteria Wet Prep HPF POC 1+    RBC Wet Prep HPF POC neg    WBC Wet Prep HPF POC 4-6    KOH Prep POC Negative

## 2013-12-10 NOTE — Patient Instructions (Signed)
Bacterial Vaginosis Bacterial vaginosis is a vaginal infection that occurs when the normal balance of bacteria in the vagina is disrupted. It results from an overgrowth of certain bacteria. This is the most common vaginal infection in women of childbearing age. Treatment is important to prevent complications, especially in pregnant women, as it can cause a premature delivery. CAUSES  Bacterial vaginosis is caused by an increase in harmful bacteria that are normally present in smaller amounts in the vagina. Several different kinds of bacteria can cause bacterial vaginosis. However, the reason that the condition develops is not fully understood. RISK FACTORS Certain activities or behaviors can put you at an increased risk of developing bacterial vaginosis, including:  Having a new sex partner or multiple sex partners.  Douching.  Using an intrauterine device (IUD) for contraception. Women do not get bacterial vaginosis from toilet seats, bedding, swimming pools, or contact with objects around them. SIGNS AND SYMPTOMS  Some women with bacterial vaginosis have no signs or symptoms. Common symptoms include:  Grey vaginal discharge.  A fishlike odor with discharge, especially after sexual intercourse.  Itching or burning of the vagina and vulva.  Burning or pain with urination. DIAGNOSIS  Your health care provider will take a medical history and examine the vagina for signs of bacterial vaginosis. A sample of vaginal fluid may be taken. Your health care provider will look at this sample under a microscope to check for bacteria and abnormal cells. A vaginal pH test may also be done.  TREATMENT  Bacterial vaginosis may be treated with antibiotic medicines. These may be given in the form of a pill or a vaginal cream. A second round of antibiotics may be prescribed if the condition comes back after treatment.  HOME CARE INSTRUCTIONS   Only take over-the-counter or prescription medicines as  directed by your health care provider.  If antibiotic medicine was prescribed, take it as directed. Make sure you finish it even if you start to feel better.  Do not have sex until treatment is completed.  Tell all sexual partners that you have a vaginal infection. They should see their health care provider and be treated if they have problems, such as a mild rash or itching.  Practice safe sex by using condoms and only having one sex partner. SEEK MEDICAL CARE IF:   Your symptoms are not improving after 3 days of treatment.  You have increased discharge or pain.  You have a fever. MAKE SURE YOU:   Understand these instructions.  Will watch your condition.  Will get help right away if you are not doing well or get worse. FOR MORE INFORMATION  Centers for Disease Control and Prevention, Division of STD Prevention: www.cdc.gov/std American Sexual Health Association (ASHA): www.ashastd.org  Document Released: 01/22/2005 Document Revised: 11/12/2012 Document Reviewed: 09/03/2012 ExitCare Patient Information 2015 [employer]. This information is not intended to replace advice given to you by your health care provider. Make sure you discuss any questions you have with your health care provider.  

## 2013-12-12 LAB — C. DIFFICILE GDH AND TOXIN A/B
C. DIFFICILE GDH: NOT DETECTED
C. difficile Toxin A/B: NOT DETECTED

## 2013-12-15 ENCOUNTER — Ambulatory Visit (INDEPENDENT_AMBULATORY_CARE_PROVIDER_SITE_OTHER): Payer: BC Managed Care – PPO | Admitting: Physician Assistant

## 2013-12-15 ENCOUNTER — Encounter: Payer: Self-pay | Admitting: Physician Assistant

## 2013-12-15 ENCOUNTER — Other Ambulatory Visit (INDEPENDENT_AMBULATORY_CARE_PROVIDER_SITE_OTHER): Payer: BC Managed Care – PPO

## 2013-12-15 VITALS — BP 102/80 | HR 72 | Ht 65.0 in | Wt 244.0 lb

## 2013-12-15 DIAGNOSIS — K625 Hemorrhage of anus and rectum: Secondary | ICD-10-CM

## 2013-12-15 DIAGNOSIS — R197 Diarrhea, unspecified: Secondary | ICD-10-CM

## 2013-12-15 LAB — OVA AND PARASITE EXAMINATION: OP: NONE SEEN

## 2013-12-15 LAB — CBC WITH DIFFERENTIAL/PLATELET
BASOS PCT: 0.8 % (ref 0.0–3.0)
Basophils Absolute: 0 10*3/uL (ref 0.0–0.1)
EOS PCT: 5.3 % — AB (ref 0.0–5.0)
Eosinophils Absolute: 0.2 10*3/uL (ref 0.0–0.7)
HEMATOCRIT: 42.1 % (ref 36.0–46.0)
HEMOGLOBIN: 13.9 g/dL (ref 12.0–15.0)
LYMPHS ABS: 1.5 10*3/uL (ref 0.7–4.0)
Lymphocytes Relative: 34.3 % (ref 12.0–46.0)
MCHC: 33 g/dL (ref 30.0–36.0)
MCV: 87.9 fl (ref 78.0–100.0)
MONOS PCT: 12.9 % — AB (ref 3.0–12.0)
Monocytes Absolute: 0.6 10*3/uL (ref 0.1–1.0)
Neutro Abs: 2.1 10*3/uL (ref 1.4–7.7)
Neutrophils Relative %: 46.7 % (ref 43.0–77.0)
Platelets: 302 10*3/uL (ref 150.0–400.0)
RBC: 4.79 Mil/uL (ref 3.87–5.11)
RDW: 12.9 % (ref 11.5–15.5)
WBC: 4.5 10*3/uL (ref 4.0–10.5)

## 2013-12-15 LAB — STOOL CULTURE

## 2013-12-15 LAB — HIGH SENSITIVITY CRP: CRP HIGH SENSITIVITY: 7.55 mg/L — AB (ref 0.000–5.000)

## 2013-12-15 LAB — SEDIMENTATION RATE: Sed Rate: 23 mm/hr — ABNORMAL HIGH (ref 0–22)

## 2013-12-15 MED ORDER — MOVIPREP 100 G PO SOLR: 1.0000 | ORAL | Status: DC

## 2013-12-15 NOTE — Patient Instructions (Signed)
Please go to the basement level to have your labs drawn.  You have been scheduled for a colonoscopy. Please follow written instructions given to you at your visit today.  Please pick up your prep kit at the pharmacy within the next 1-3 days. [place]. If you use inhalers (even only as needed), please bring them with you on the day of your procedure. Your physician has requested that you go to www.startemmi.com and enter the access code given to you at your visit today. This web site gives a general overview about your procedure. However, you should still follow specific instructions given to you by our office regarding your preparation for the procedure.

## 2013-12-15 NOTE — Progress Notes (Addendum)
Subjective:    Patient ID: Robin Mora, female    DOB: 1991-01-01, 23 y.o.   MRN: [id number]  HPI Alroy Dust is a pleasant 23 year old African-American female due to GI today referred by Dr. Addison Naegeli for evaluation of persistent diarrhea. Patient has no previous GI history, and states that her diarrhea started in early August and has been persistent ever since. She says she had been having some problems with constipation and had been in the habit of taking a laxative on the weekends and says the diarrhea started after one of those weekends. She says normally she will have one bowel movement per day and is now having consistently 4-5 bowel movements per day of loose to liquid stool. She has intermittently been seeing bright red blood mixed in with her bowel movements. She has no complaint of fever or chills, no nausea or vomiting, appetite has been fine and weight has been stable. No real abdominal pain or cramping ,no rectal pain ,her energy level is normal. She know she is lactose intolerant and says she usually has loose stools after any lactose. She's not been on any new medications nor had she had any antibiotics prior to onset of the diarrhea. She is currently completing a course of metronidazole for a bacterial vaginosis. He is now noticed any improvement in her diarrhea with the metronidazole. No foreign travel or known infectious exposures. Family history negative for GI disease. She did have stool culture stool for C. Difficile toxin and stool O&P done negative except the O&P which is pending.    Review of Systems  Constitutional: Negative.   Eyes: Negative.   Respiratory: Negative.   Cardiovascular: Negative.   Gastrointestinal: Positive for diarrhea and blood in stool.  Endocrine: Negative.   Genitourinary: Negative.   Musculoskeletal: Negative.   Skin: Negative.   Allergic/Immunologic: Negative.   Neurological: Negative.   Hematological: Negative.   Psychiatric/Behavioral:  Negative.    Outpatient Encounter Prescriptions as of 12/15/2013  Medication Sig  . metroNIDAZOLE (FLAGYL) 500 MG tablet Take 1 tablet (500 mg total) by mouth 2 (two) times daily with a meal. DO NOT CONSUME ALCOHOL WHILE TAKING THIS MEDICATION.  Marland Kitchen MOVIPREP 100 G SOLR Take 1 kit (200 g total) by mouth as directed.  . [DISCONTINUED] ergocalciferol (DRISDOL) 50000 UNITS capsule Take 1 capsule (50,000 Units total) by mouth once a week.  . [DISCONTINUED] fexofenadine (ALLEGRA) 180 MG tablet Take 1 tablet (180 mg total) by mouth daily.      No Known Allergies Patient Active Problem List   Diagnosis Date Noted  . Large breasts 05/07/2013  . Obesity, unspecified 05/07/2013   History  Substance Use Topics  . Smoking status: Never Smoker   . Smokeless tobacco: Never Used  . Alcohol Use: No   family history includes Diabetes in her maternal grandmother, paternal grandfather, and paternal grandmother; Heart disease in her paternal grandfather; Hyperlipidemia in her father; Hypertension in her father, paternal grandfather, and paternal grandmother; Kidney disease in her paternal grandfather; Sleep apnea in her father.  Objective:   Physical Exam  Well-developed young female in no acute distress, pleasant blood pressure 102/80 pulse 72 height 5 foot 5 weight 244, BMI 40. HEENT; nontraumatic normocephalic EOMI PERRLA sclera anicteric, Supple; no JVD, Cardiovascular; regular rate and rhythm with S1-S2 no murmur or gallop, Pulmonary; clear bilaterally, Abdomen ;large soft basically nontender there is no palpable mass or hepatosplenomegaly bowel sounds are present, Rectal; exam not done, Extremities; no clubbing cyanosis or edema skin warm and  dry, Psych ;mood and affect appropriate        Assessment & Plan:  #74 23 year old female with four-month history of persistent diarrhea with intermittent blood in stool. Given duration of diarrhea this is unlikely infectious though will definitely need to rule  out C. Difficile Suspect new onset inflammatory bowel disease  #2 Obesity  Plan; We'll check CBC with differential, sedimentation rate and CRP Stool for C. Difficile by PCR Schedule for colonoscopy with Dr.Pyrtle. Procedure discussed in detail with the patient and she is agreeable to proceed. In the interim until colonoscopy is done she may take Imodium AD  one every morning and repeat as needed.  Addendum: Reviewed and agree with initial management. Awaiting C. Difficile stool testing Jerene Bears, MD

## 2013-12-30 ENCOUNTER — Encounter: Payer: Self-pay | Admitting: Internal Medicine

## 2014-01-20 ENCOUNTER — Encounter: Payer: BC Managed Care – PPO | Admitting: Internal Medicine

## 2014-02-17 ENCOUNTER — Ambulatory Visit (INDEPENDENT_AMBULATORY_CARE_PROVIDER_SITE_OTHER): Payer: BC Managed Care – PPO | Admitting: Family Medicine

## 2014-02-17 VITALS — BP 100/60 | HR 78 | Temp 98.3°F | Resp 16 | Ht 65.5 in | Wt 248.0 lb

## 2014-02-17 DIAGNOSIS — N760 Acute vaginitis: Secondary | ICD-10-CM

## 2014-02-17 DIAGNOSIS — A499 Bacterial infection, unspecified: Secondary | ICD-10-CM

## 2014-02-17 DIAGNOSIS — Z111 Encounter for screening for respiratory tuberculosis: Secondary | ICD-10-CM

## 2014-02-17 DIAGNOSIS — B9689 Other specified bacterial agents as the cause of diseases classified elsewhere: Secondary | ICD-10-CM

## 2014-02-17 DIAGNOSIS — N910 Primary amenorrhea: Secondary | ICD-10-CM

## 2014-02-17 DIAGNOSIS — N926 Irregular menstruation, unspecified: Secondary | ICD-10-CM

## 2014-02-17 DIAGNOSIS — N898 Other specified noninflammatory disorders of vagina: Secondary | ICD-10-CM

## 2014-02-17 LAB — POCT WET PREP WITH KOH
KOH Prep POC: NEGATIVE
Trichomonas, UA: NEGATIVE
YEAST WET PREP PER HPF POC: NEGATIVE

## 2014-02-17 LAB — POCT URINE PREGNANCY: Preg Test, Ur: NEGATIVE

## 2014-02-17 MED ORDER — METRONIDAZOLE 500 MG PO TABS: 500.0000 mg | ORAL_TABLET | Freq: Two times a day (BID) | ORAL | Status: DC

## 2014-02-17 NOTE — Patient Instructions (Signed)
Don't forget to come and see us for your TB test read.   I will be in touch with the rest of your labs We will treat you for BV with flagyl- they try the boric acid suppositories to see if we can keep your symptoms from coming back  It looks like you are about to get your period- let me know if this does not happen.    Please use condoms all the time if this is your preferred form of contraception.  You might also consider getting a diaphragm- this can be fitted by an OBG and is hormone free

## 2014-02-17 NOTE — Progress Notes (Signed)
[facility] [address] [place] [address] [phone]  Date:  02/17/2014   Name:  Robin Mora   DOB:  1990/12/11   MRN:  [id number]  PCP:  Dow Adolph, MD    Chief Complaint: Employment Physical   History of Present Illness:  Robin Mora is a 24 y.o. 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  She had a pap in [place] over the summer.    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  However she thinks she had a tetanus when she started work at the nursing home in 2011.   She would like to have labs done today She also notes vaginal discharge- she has noticed this for about one month.  No itching.   No new partners recently.  However is does feel that there is a possibility of pregnancy- her LMP was 12/15.    She was treated for BV twice in November.  She felt like she was "kind of" normal for a little while. However sx came back in mid- December. This is a recent problem for her- never chronic like this in the past.    She has used depo- provera and OCP in the past.  However she is absolutely opposed to using any hormonal contraception again as she seemed to have side effects.  She has had 2 terminations in the past- she does not wish to have another termination but is only using condoms some of the time.  "I don't really want to get pregnant now but if it happens it happens"   Patient Active Problem List   Diagnosis Date Noted  . Large breasts 05/07/2013  . Obesity, unspecified 05/07/2013    History reviewed. No pertinent past medical history.  History reviewed. No pertinent past surgical history.  History  Substance Use Topics  . Smoking status: Never Smoker   . Smokeless tobacco: Never Used  . Alcohol Use: No    Family History  Problem Relation Age of Onset  .  Hyperlipidemia Father   . Hypertension Father   . Sleep apnea Father   . Diabetes Maternal Grandmother   . Diabetes Paternal Grandmother   . Hypertension Paternal Grandmother   . Heart disease Paternal Grandfather   . Hypertension Paternal Grandfather   . Kidney disease Paternal Grandfather   . Diabetes Paternal Grandfather     No Known Allergies  Medication list has been reviewed and updated.  No current outpatient prescriptions on file prior to visit.   No current facility-administered medications on file prior to visit.    Review of Systems:  As per HPI- otherwise negative.   Physical Examination: Filed Vitals:   02/17/14 1548  BP: 100/60  Pulse: 78  Temp: 98.3 F (36.8 C)  Resp: 16   Filed Vitals:   02/17/14 1548  Height: 5' 5.5" (1.664 m)  Weight: 248 lb (112.492 kg)   Body mass index is 40.63 kg/(m^2). Ideal Body Weight: Weight in (lb) to have BMI = 25: 152.2  GEN: WDWN, NAD, Non-toxic, A & O x 3, obese, looks well HEENT: Atraumatic, Normocephalic. Neck supple. No masses, No LAD.  Bilateral TM wnl, oropharynx normal.  PEERL,EOMI.   Ears and Nose: No external deformity. CV: RRR, No M/G/R. No JVD. No thrill.  No extra heart sounds. PULM: CTA B, no wheezes, crackles, rhonchi. No retractions. No resp. distress. No accessory muscle use. EXTR: No c/c/e NEURO Normal gait.  PSYCH: Normally interactive. Conversant. Not depressed or anxious appearing.  Calm demeanor.   Results for orders placed or performed in visit on 02/17/14  POCT urine pregnancy  Result Value Ref Range   Preg Test, Ur Negative   POCT Wet Prep with KOH  Result Value Ref Range   Trichomonas, UA Negative    Clue Cells Wet Prep HPF POC 6-20    Epithelial Wet Prep HPF POC 6-20    Yeast Wet Prep HPF POC neg    Bacteria Wet Prep HPF POC 3+    RBC Wet Prep HPF POC 0-3    WBC Wet Prep HPF POC 2-6    KOH Prep POC Negative     Assessment and Plan: Screening for tuberculosis - Plan: TB Skin  Test  Vaginal discharge - Plan: GC/Chlamydia Probe Amp, POCT Wet Prep with KOH  Late menses - Plan: POCT urine pregnancy  BV (bacterial vaginosis) - Plan: metroNIDAZOLE (FLAGYL) 500 MG tablet  Had a long talk with her regarding contraception.  She is adamantly opposed to using any rx contraceptive and does not want any longer- term method such as an IUD or implanon.  However she might consider a diaphragm or sponge OTC.  I encouraged her to really think about this and not allow pregnancy to occur to her by accident.  Encouraged her to use condoms consistently if this is her choice, and offered to set her up with OBG for a diaphragm fitting but she declined.    Recurrent BV- treat with flagyl twice a day for one week, and then she will try boric acid suppositories twice a week for a few months.   Await her other labs Her menses are due any day and her HCG is negative today.  She will report if her menses do not come as expected.    Signed Abbe AmsterdamJessica Tryniti Laatsch, MD

## 2014-02-18 LAB — GC/CHLAMYDIA PROBE AMP
CT PROBE, AMP APTIMA: NEGATIVE
GC PROBE AMP APTIMA: NEGATIVE

## 2014-02-19 ENCOUNTER — Ambulatory Visit (INDEPENDENT_AMBULATORY_CARE_PROVIDER_SITE_OTHER): Payer: BC Managed Care – PPO | Admitting: *Deleted

## 2014-02-19 DIAGNOSIS — Z111 Encounter for screening for respiratory tuberculosis: Secondary | ICD-10-CM

## 2014-02-19 LAB — TB SKIN TEST
Induration: 0 mm
TB SKIN TEST: NEGATIVE

## 2014-02-19 NOTE — Addendum Note (Signed)
Addended by: Nita SellsSMITH, Maleiah Dula S on: 02/19/2014 04:53 PM   Modules accepted: Level of Service

## 2014-02-19 NOTE — Progress Notes (Signed)
   Subjective:    Patient ID: Robin Mora, female    DOB: 02/09/1990, 24 y.o.   MRN: [id number]  HPI  Patient here for ppd reading. Negative reading 0 mm induration.  Review of Systems     Objective:   Physical Exam        Assessment & Plan:

## 2014-05-27 ENCOUNTER — Encounter: Payer: Self-pay | Admitting: Family Medicine

## 2014-05-27 ENCOUNTER — Ambulatory Visit (INDEPENDENT_AMBULATORY_CARE_PROVIDER_SITE_OTHER): Payer: BC Managed Care – PPO | Admitting: Family Medicine

## 2014-05-27 VITALS — BP 110/64 | HR 71 | Temp 98.5°F | Resp 16 | Ht 65.0 in | Wt 241.6 lb

## 2014-05-27 DIAGNOSIS — R350 Frequency of micturition: Secondary | ICD-10-CM

## 2014-05-27 DIAGNOSIS — E739 Lactose intolerance, unspecified: Secondary | ICD-10-CM

## 2014-05-27 DIAGNOSIS — R197 Diarrhea, unspecified: Secondary | ICD-10-CM

## 2014-05-27 DIAGNOSIS — A09 Infectious gastroenteritis and colitis, unspecified: Secondary | ICD-10-CM

## 2014-05-27 LAB — POCT URINALYSIS DIPSTICK
Bilirubin, UA: NEGATIVE
GLUCOSE UA: NEGATIVE
Ketones, UA: NEGATIVE
Leukocytes, UA: NEGATIVE
NITRITE UA: NEGATIVE
PH UA: 6
Spec Grav, UA: 1.025
UROBILINOGEN UA: 0.2

## 2014-05-27 LAB — POCT UA - MICROSCOPIC ONLY
Bacteria, U Microscopic: NEGATIVE
CRYSTALS, UR, HPF, POC: NEGATIVE
Casts, Ur, LPF, POC: NEGATIVE
Mucus, UA: NEGATIVE

## 2014-05-27 NOTE — Patient Instructions (Addendum)
I will refer you to the GI specialist in [place], [place] once you send me the information through MyChart. I will ask that the initial appointment be scheduled on a Friday.   I will let you know the results of your urine tests and prescribe medication if you have an infection.

## 2014-05-28 ENCOUNTER — Encounter: Payer: Self-pay | Admitting: Family Medicine

## 2014-05-28 NOTE — Progress Notes (Signed)
Subjective:    Patient ID: Weston AnnaSaadia Son, female    DOB: 02/05/1991, 24 y.o.   MRN: [id number]  HPI This 24 y.o. Female was seen last year with c/o mild abd pain and episodes of bloody diarrhea since Aug 2015. Pt was referred to [facility]; today, she states that she did not want to return there because she did not feel comfortable or have a good interaction there. Daily loose stools w/ blood mixed in occur 2-4 times a day. Pt also endorses lactose intolerance but continues to ingest cheese and ice cream. Otherwise she has maintained her weight; she denies fever/chills, fatigue, sore throat or difficulty swallowing, CP or tightness, SOB, cough, abnormal menses or pelvic pain, HA, rashes, weakness or syncope.  Pt reports both parents have GI problems.  Pt c/o urinary frequency w/o dysuria, onset after tx for BV. She has been using boric acid as prescribed. She denies itchy vag discharge or change in menses or hematuria.  Patient Active Problem List   Diagnosis Date Noted  . Large breasts 05/07/2013  . Obesity, unspecified 05/07/2013    Prior to Admission medications   Medication Sig Start Date End Date Taking? Authorizing Provider  metroNIDAZOLE (FLAGYL) 500 MG tablet Take 1 tablet (500 mg total) by mouth 2 (two) times daily with a meal. DO NOT CONSUME ALCOHOL WHILE TAKING THIS MEDICATION. Patient not taking: Reported on 05/27/2014 02/17/14   Pearline CablesJessica C Copland, MD   No past surgical history on file.  History   Social History  . Marital Status: Single    Spouse Name: N/A  . Number of Children: N/A  . Years of Education: N/A   Occupational History  . Teacher    Social History Main Topics  . Smoking status: Never Smoker   . Smokeless tobacco: Never Used  . Alcohol Use: No  . Drug Use: No  . Sexual Activity: No   Other Topics Concern  . Not on file   Social History Narrative    Family History  Problem Relation Age of Onset  . Hyperlipidemia Father   . Hypertension Father     . Sleep apnea Father   . Diabetes Maternal Grandmother   . Diabetes Paternal Grandmother   . Hypertension Paternal Grandmother   . Heart disease Paternal Grandfather   . Hypertension Paternal Grandfather   . Kidney disease Paternal Grandfather   . Diabetes Paternal Grandfather     Review of Systems As per HPI.     Objective:   Physical Exam  Constitutional: She is oriented to person, place, and time. She appears well-developed and well-nourished. No distress.  HENT:  Head: Normocephalic and atraumatic.  Right Ear: External ear normal.  Left Ear: External ear normal.  Nose: Nose normal.  Mouth/Throat: Oropharynx is clear and moist. No oropharyngeal exudate.  Eyes: Conjunctivae and EOM are normal. Pupils are equal, round, and reactive to light. No scleral icterus.  Neck: Normal range of motion. Neck supple.  Cardiovascular: Normal rate and regular rhythm.   Pulmonary/Chest: Effort normal. No respiratory distress.  Abdominal: Soft. Normal appearance and bowel sounds are normal. She exhibits no distension and no mass. There is no hepatosplenomegaly. There is no tenderness. There is no guarding and no CVA tenderness.  Musculoskeletal: Normal range of motion.  Neurological: She is alert and oriented to person, place, and time. No cranial nerve deficit. Coordination normal.  Skin: Skin is warm and dry. She is not diaphoretic. No erythema. No pallor.  Psychiatric: She has a  normal mood and affect. Her behavior is normal. Judgment and thought content normal.  Nursing note and vitals reviewed.  Results for orders placed or performed in visit on 05/27/14  POCT urinalysis dipstick  Result Value Ref Range   Color, UA Yellow    Clarity, UA Clear    Glucose, UA Negative    Bilirubin, UA Negative    Ketones, UA negative    Spec Grav, UA 1.025    Blood, UA Trace-Intact    pH, UA 6.0    Protein, UA Trace    Urobilinogen, UA 0.2    Nitrite, UA Negative    Leukocytes, UA Negative   POCT  UA - Microscopic Only  Result Value Ref Range   WBC, Ur, HPF, POC 0-1    RBC, urine, microscopic 5-7    Bacteria, U Microscopic Negative    Mucus, UA Negative    Epithelial cells, urine per micros 0-3    Crystals, Ur, HPF, POC Negative    Casts, Ur, LPF, POC negative    Yeast, UA         Assessment & Plan:  Bloody diarrhea - Pt requests referral to GI practice in [place], [place] where her mother is a pt. She will provide that information. Plan: Ambulatory referral to Gastroenterology  Urinary frequency - Plan: POCT urinalysis dipstick, POCT UA - Microscopic Only  Lactose intolerance in adult- Advised about food restrictions in order to reduce symptoms.

## 2014-07-22 LAB — BASIC METABOLIC PANEL
BUN: 13 mg/dL (ref 4–21)
Creatinine: 0.7 mg/dL (ref 0.5–1.1)
Glucose: 60 mg/dL
POTASSIUM: 4.1 mmol/L (ref 3.4–5.3)

## 2014-07-22 LAB — CBC AND DIFFERENTIAL
HEMATOCRIT: 42 % (ref 36–46)
Hemoglobin: 13.3 g/dL (ref 12.0–16.0)
Platelets: 344 10*3/uL (ref 150–399)
WBC: 5 10*3/mL

## 2014-07-22 LAB — TSH: TSH: 2.06 u[IU]/mL (ref 0.41–5.90)

## 2014-07-22 LAB — HEPATIC FUNCTION PANEL
ALT: 32 U/L (ref 7–35)
AST: 38 U/L — AB (ref 13–35)
Alkaline Phosphatase: 77 U/L (ref 25–125)

## 2014-08-17 ENCOUNTER — Encounter: Payer: Self-pay | Admitting: *Deleted

## 2014-10-13 ENCOUNTER — Encounter: Payer: Self-pay | Admitting: Family Medicine

## 2014-10-15 NOTE — Telephone Encounter (Signed)
-----   Message from Pearline Cables, MD sent at 10/14/2014  3:22 PM EDT ----- Mail her boric acid rx

## 2014-10-27 ENCOUNTER — Ambulatory Visit (INDEPENDENT_AMBULATORY_CARE_PROVIDER_SITE_OTHER): Payer: BC Managed Care – PPO | Admitting: Physician Assistant

## 2014-10-27 VITALS — BP 114/78 | HR 90 | Temp 98.7°F | Resp 16 | Ht 65.0 in | Wt 240.0 lb

## 2014-10-27 DIAGNOSIS — N898 Other specified noninflammatory disorders of vagina: Secondary | ICD-10-CM

## 2014-10-27 DIAGNOSIS — Z113 Encounter for screening for infections with a predominantly sexual mode of transmission: Secondary | ICD-10-CM | POA: Diagnosis not present

## 2014-10-27 DIAGNOSIS — A599 Trichomoniasis, unspecified: Secondary | ICD-10-CM

## 2014-10-27 LAB — POCT WET + KOH PREP
Yeast by KOH: ABSENT
Yeast by wet prep: ABSENT

## 2014-10-27 MED ORDER — METRONIDAZOLE 500 MG PO TABS: 500.0000 mg | ORAL_TABLET | Freq: Two times a day (BID) | ORAL | Status: DC

## 2014-10-27 NOTE — Progress Notes (Signed)
Patient ID: Robin Mora, female    DOB: 06-Jul-1990, 24 y.o.   MRN: [id number]  PCP: Dow Adolph, MD  Subjective:   Chief Complaint  Patient presents with  . Vaginal Discharge    yesterday - had odor  . STD testing    HPI Presents for evaluation of vaginal discharge and desires STI testing.  No vaginal itching or burning. Feels pressure, uncomfortable, with urination. No increased urinary frequency or urgency. No hematuria. No back pain. No abdominal or pelvic pain, other than the pressure.  No history of STI. History of BV.  Currently has 1 sexual partner. ! Partner in the past 6 months. 10 lifetime partners. Inconsistent condom use.    Review of Systems  Constitutional: Negative for fever and chills.  Gastrointestinal: Negative for nausea, vomiting, abdominal pain, diarrhea and constipation.  Genitourinary: Positive for dysuria ("pressure"). Negative for urgency, frequency, hematuria, flank pain, decreased urine volume, difficulty urinating and menstrual problem.  Musculoskeletal: Negative for myalgias and back pain.  Skin: Negative for rash.       Patient Active Problem List   Diagnosis Date Noted  . Large breasts 05/07/2013  . Obesity, unspecified 05/07/2013     Prior to Admission medications   Not on File     No Known Allergies     Objective:  Physical Exam  Constitutional: She is oriented to person, place, and time. She appears well-developed and well-nourished. She is active and cooperative. No distress.  BP 114/78 mmHg  Pulse 90  Temp(Src) 98.7 F (37.1 C) (Oral)  Resp 16  Ht  (1.651 m)  Wt 240 lb (108.863 kg)  BMI 39.94 kg/m2  SpO2 98%  LMP 10/19/2014   Eyes: Conjunctivae are normal.  Pulmonary/Chest: Effort normal.  Abdominal: Hernia confirmed negative in the right inguinal area and confirmed negative in the left inguinal area.  Genitourinary: Uterus normal. Pelvic exam was performed with patient supine. No labial fusion.  There is no rash, tenderness, lesion or injury on the right labia. There is no rash, tenderness, lesion or injury on the left labia. Cervix exhibits no motion tenderness, no discharge and no friability. Right adnexum displays no mass, no tenderness and no fullness. Left adnexum displays no mass, no tenderness and no fullness. No erythema, tenderness or bleeding in the vagina. No foreign body around the vagina. No signs of injury around the vagina. Vaginal discharge (thick, creamy white malodorous) found.  Lymphadenopathy:       Right: No inguinal adenopathy present.       Left: No inguinal adenopathy present.  Neurological: She is alert and oriented to person, place, and time.  Psychiatric: She has a normal mood and affect. Her speech is normal and behavior is normal.    Results for orders placed or performed in visit on 10/27/14  POCT Wet + KOH Prep ([facility])  Result Value Ref Range   Yeast by KOH Absent Present, Absent   Yeast by wet prep Absent Present, Absent   WBC by wet prep Few None, Few   Clue Cells Wet Prep HPF POC None None   Trich by wet prep Present Present, Absent   Bacteria Wet Prep HPF POC Few None, Few   Epithelial Cells By [employer] Pref ([facility]) Moderate (A) None, Few   RBC,UR,HPF,POC None None RBC/hpf          Assessment & Plan:   1. Vaginal discharge - POCT Wet + KOH Prep ([facility]) - GC/Chlamydia Probe Amp  2. Trichomonas vaginalis infection Complete  treatment. Partner needs notification and treatment as well. Do not resume sexual activity until both have completed treatment. Encouraged consistent condom use. - metroNIDAZOLE (FLAGYL) 500 MG tablet; Take 1 tablet (500 mg total) by mouth 2 (two) times daily with a meal. DO NOT CONSUME ALCOHOL WHILE TAKING THIS MEDICATION.  Dispense: 14 tablet; Refill: 0  3. Routine screening for STI (sexually transmitted infection) - HIV antibody - Hepatitis C antibody - Hepatitis B surface antibody - Hepatitis B surface antigen -  RPR   Fernande Bras, PA-C Physician Assistant-Certified [facility]

## 2014-10-27 NOTE — Patient Instructions (Addendum)
I will contact you with your lab results as soon as they are available.   If you have not heard from me in 2 weeks, please contact me.  The fastest way to get your results is to register for My Chart (see the instructions on the last page of this printout).  Trichomoniasis Trichomoniasis is an infection caused by an organism called Trichomonas. The infection can affect both women and men. In women, the outer female genitalia and the vagina are affected. In men, the penis is mainly affected, but the prostate and other reproductive organs can also be involved. Trichomoniasis is a sexually transmitted infection (STI) and is most often passed to another person through sexual contact.  RISK FACTORS  Having unprotected sexual intercourse.  Having sexual intercourse with an infected partner. SIGNS AND SYMPTOMS  Symptoms of trichomoniasis in women include:  Abnormal gray-green frothy vaginal discharge.  Itching and irritation of the vagina.  Itching and irritation of the area outside the vagina. Symptoms of trichomoniasis in men include:   Penile discharge with or without pain.  Pain during urination. This results from inflammation of the urethra. DIAGNOSIS  Trichomoniasis may be found during a Pap test or physical exam. Your health care provider may use one of the following methods to help diagnose this infection:  Examining vaginal discharge under a microscope. For men, urethral discharge would be examined.  Testing the pH of the vagina with a test tape.  Using a vaginal swab test that checks for the Trichomonas organism. A test is available that provides results within a few minutes.  Doing a culture test for the organism. This is not usually needed. TREATMENT   You may be given medicine to fight the infection. Women should inform their health care provider if they could be or are pregnant. Some medicines used to treat the infection should not be taken during pregnancy.  Your health  care provider may recommend over-the-counter medicines or creams to decrease itching or irritation.  Your sexual partner will need to be treated if infected. HOME CARE INSTRUCTIONS   Take medicines only as directed by your health care provider.  Take over-the-counter medicine for itching or irritation as directed by your health care provider.  Do not have sexual intercourse while you have the infection.  Women should not douche or wear tampons while they have the infection.  Discuss your infection with your partner. Your partner may have gotten the infection from you, or you may have gotten it from your partner.  Have your sex partner get examined and treated if necessary.  Practice safe, informed, and protected sex.  See your health care provider for other STI testing. SEEK MEDICAL CARE IF:   You still have symptoms after you finish your medicine.  You develop abdominal pain.  You have pain when you urinate.  You have bleeding after sexual intercourse.  You develop a rash.  Your medicine makes you sick or makes you throw up (vomit). MAKE SURE YOU:  Understand these instructions.  Will watch your condition.  Will get help right away if you are not doing well or get worse. Document Released: 07/18/2000 Document Revised: 06/08/2013 Document Reviewed: 11/03/2012 [facility] Patient Information 2015 [place], [place]. This information is not intended to replace advice given to you by your health care provider. Make sure you discuss any questions you have with your health care provider.

## 2014-10-28 LAB — HIV ANTIBODY (ROUTINE TESTING W REFLEX): HIV 1&2 Ab, 4th Generation: NONREACTIVE

## 2014-10-29 LAB — HEPATITIS B SURFACE ANTIGEN: HEP B S AG: NEGATIVE

## 2014-10-29 LAB — HEPATITIS C ANTIBODY: HCV Ab: NEGATIVE

## 2014-10-29 LAB — RPR

## 2014-10-29 LAB — HEPATITIS B SURFACE ANTIBODY, QUANTITATIVE: HEPATITIS B-POST: 10.1 m[IU]/mL

## 2014-10-29 LAB — GC/CHLAMYDIA PROBE AMP
CT Probe RNA: NEGATIVE
GC PROBE AMP APTIMA: NEGATIVE

## 2014-11-06 ENCOUNTER — Ambulatory Visit (INDEPENDENT_AMBULATORY_CARE_PROVIDER_SITE_OTHER): Payer: BC Managed Care – PPO | Admitting: Physician Assistant

## 2014-11-06 VITALS — BP 108/64 | HR 96 | Temp 98.5°F | Resp 16 | Ht 65.0 in | Wt 239.0 lb

## 2014-11-06 DIAGNOSIS — B373 Candidiasis of vulva and vagina: Secondary | ICD-10-CM | POA: Diagnosis not present

## 2014-11-06 DIAGNOSIS — N898 Other specified noninflammatory disorders of vagina: Secondary | ICD-10-CM | POA: Diagnosis not present

## 2014-11-06 DIAGNOSIS — B3731 Acute candidiasis of vulva and vagina: Secondary | ICD-10-CM

## 2014-11-06 LAB — POCT WET + KOH PREP: TRICH BY WET PREP: ABSENT

## 2014-11-06 MED ORDER — FLUCONAZOLE 150 MG PO TABS: 150.0000 mg | ORAL_TABLET | Freq: Once | ORAL | Status: DC

## 2014-11-06 NOTE — Progress Notes (Signed)
Patient ID: Kura Bethards, female    DOB: [birth year], 24 y.o.   MRN: [id number]  PCP: No primary care provider on file.  Chief Complaint  Patient presents with  . Follow-up    Test of Cure    Subjective:  HPI Patient presents today for re-evaluation of Trichomoniasis.   She was seen at this clinic on 09/21, diagnosed with Trich, and prescribed Flagyl. She has completed the course of treatment and tolerated it well. Her partner has also been treated and completed his regimen. She states that her and her partner will be using condoms from now on when engaging in sexual intercourse.   She believes she may have a yeast infection because of the Flagyl. In the past, she has been treated for Bacterial Vaginosis with Flagyl and had a yeast infection after completing treatment. She is having white, thick, non-odorous vaginal discharge that is different from the discharge she experienced from [place].   No abdominal pain, nausea, vomiting, fever, chills, or urinary symptoms.     Review of Systems As above.    Patient Active Problem List   Diagnosis Date Noted  . Large breasts 05/07/2013  . Obesity, unspecified 05/07/2013    No Known Allergies  Prior to Admission medications   Not on File     Past Medical, Surgical Family and Social History reviewed and updated.        Objective:  Physical Exam  Constitutional: She is oriented to person, place, and time. She appears well-developed and well-nourished. She is active and cooperative. No distress.  BP 108/64 mmHg  Pulse 96  Temp(Src) 98.5 F (36.9 C) (Oral)  Resp 16  Ht  (1.651 m)  Wt 239 lb (108.41 kg)  BMI 39.77 kg/m2  SpO2 98%  LMP 10/19/2014   Eyes: Conjunctivae are normal.  Pulmonary/Chest: Effort normal.  Genitourinary: Uterus normal. Pelvic exam was performed with patient supine. No labial fusion. There is no rash, tenderness, lesion or injury on the right labia. There is no rash, tenderness, lesion or injury  on the left labia. Cervix exhibits no motion tenderness, no discharge and no friability. Right adnexum displays no mass, no tenderness and no fullness. Left adnexum displays no mass, no tenderness and no fullness. No erythema, tenderness or bleeding in the vagina. No foreign body around the vagina. No signs of injury around the vagina. Vaginal discharge (large volume of thick white discharge, clumpy) found.  Lymphadenopathy:       Right: No inguinal adenopathy present.       Left: No inguinal adenopathy present.  Neurological: She is alert and oriented to person, place, and time.  Psychiatric: She has a normal mood and affect. Her speech is normal and behavior is normal.       Results for orders placed or performed in visit on 11/06/14  POCT Wet + KOH Prep ([facility])  Result Value Ref Range   Yeast by KOH Present Present, Absent   Yeast by wet prep Present Present, Absent   WBC by wet prep Few None, Few   Clue Cells Wet Prep HPF POC Few (A) None   Trich by wet prep Absent Present, Absent   Bacteria Wet Prep HPF POC Moderate (A) None, Few   Epithelial Cells By [employer] Pref ([facility]) Many (A) None, Few   RBC,UR,HPF,POC Few (A) None RBC/hpf       Assessment & Plan:  1. Vaginal discharge Few clue cells noted, but relative to many epithelial cells and recent metronidazole  for treatment of trichomonas, doubt BV is contributing. - POCT Wet + KOH Prep ([facility])  2. Yeast vaginitis Treat with fluconazole. Return if symptoms worsen or persist after treatment. At that time, test of cure of original trichomonas infection would be more conclusive, though none was seen on today's specimen. - fluconazole (DIFLUCAN) 150 MG tablet; Take 1 tablet (150 mg total) by mouth once. Repeat in one week if needed  Dispense: 2 tablet; Refill: 0   Fernande Bras, PA-C Physician Assistant-Certified [facility]

## 2014-11-06 NOTE — Progress Notes (Signed)
Subjective:     Patient ID: Robin Mora, female   DOB: October 23, 1990, 24 y.o.   MRN: [id number] PCP: No primary care provider on file.  Chief Complaint  Patient presents with  . Follow-up    Test of Cure    HPI Patient presents today for re-evaluation of Trichomoniasis. She was seen at this clinic on 09/21, diagnosed with Trich, and prescribed Flagyl. She has completed the course of treatment (finished on 09/28) and tolerated it well. Her partner has also been treated and completed his regimen (finished on 09/29). She states that her and her partner will be using condoms from now on when engaging in sexual intercourse.  She believes she may have a yeast infection now because of the Flagyl. In the past, she has been treated for Bacterial Vaginosis with Flagyl and had a yeast infection after completing treatment. She is currently having white, thick, non-odorous vaginal discharge that is different from the discharge she experienced from [place].  No abdominal pain, nausea, vomiting, fever, chills, or urinary symptoms.   Review of Systems See HPI   Patient Active Problem List   Diagnosis Date Noted  . Large breasts 05/07/2013  . Obesity, unspecified 05/07/2013    Prior to Admission medications   Medication Sig Start Date End Date Taking? Authorizing Provider  None         No Known Allergies    Objective:  Physical Exam  Constitutional: She is oriented to person, place, and time. She appears well-developed and well-nourished.  HENT:  Head: Normocephalic and atraumatic.  Genitourinary: Vagina normal. Pelvic exam was performed with patient supine. No labial fusion. There is no rash, tenderness, lesion or injury on the right labia. There is no rash, tenderness, lesion or injury on the left labia. Cervix exhibits discharge (Copious amount of white, thick, chunky non-odorous discharge. ). Cervix exhibits no motion tenderness and no friability. Right adnexum displays no mass, no tenderness and  no fullness. Left adnexum displays no mass, no tenderness and no fullness.  Neurological: She is alert and oriented to person, place, and time.  Skin: Skin is warm and dry.  Psychiatric: She has a normal mood and affect. Her behavior is normal. Thought content normal.    BP 108/64 mmHg  Pulse 96  Temp(Src) 98.5 F (36.9 C) (Oral)  Resp 16  Ht  (1.651 m)  Wt 239 lb (108.41 kg)  BMI 39.77 kg/m2  SpO2 98%  LMP 10/19/2014    Results for orders placed or performed in visit on 11/06/14  POCT Wet + KOH Prep ([facility])  Result Value Ref Range   Yeast by KOH Present Present, Absent   Yeast by wet prep Present Present, Absent   WBC by wet prep Few None, Few   Clue Cells Wet Prep HPF POC Few (A) None   Trich by wet prep Absent Present, Absent   Bacteria Wet Prep HPF POC Moderate (A) None, Few   Epithelial Cells By [employer] Pref ([facility]) Many (A) None, Few   RBC,UR,HPF,POC Few (A) None RBC/hpf    Assessment & Plan:  1. Vaginal discharge - POCT Wet + KOH Prep ([facility])  2. Yeast vaginitis Likely due to recent treatment with Flagyl. Diflucan given.There were a few clue cells seen on wet prep, however many epithelial cells. Trich cells absent. Patient may return in 2 weeks if vaginal symptoms persist. Otherwise, she can follow up as needed.  Counseled on continued usage of condoms with sexual partners.  - fluconazole (DIFLUCAN) 150  MG tablet; Take 1 tablet (150 mg total) by mouth once. Repeat in one week if needed  Dispense: 2 tablet; Refill: 0    Jaeson Molstad D. Race, PA-S Physician Assistant Student [facility]

## 2014-11-09 NOTE — Progress Notes (Signed)
  Medical screening examination/treatment/procedure(s) were performed by non-physician practitioner and as supervising physician I was immediately available for consultation/collaboration.     

## 2014-12-01 ENCOUNTER — Encounter: Payer: Self-pay | Admitting: Family Medicine

## 2014-12-01 ENCOUNTER — Ambulatory Visit (INDEPENDENT_AMBULATORY_CARE_PROVIDER_SITE_OTHER): Payer: BC Managed Care – PPO | Admitting: Family Medicine

## 2014-12-01 VITALS — BP 120/74 | HR 90 | Temp 98.4°F | Resp 16 | Ht 65.0 in | Wt 240.6 lb

## 2014-12-01 DIAGNOSIS — R300 Dysuria: Secondary | ICD-10-CM

## 2014-12-01 DIAGNOSIS — Z113 Encounter for screening for infections with a predominantly sexual mode of transmission: Secondary | ICD-10-CM | POA: Diagnosis not present

## 2014-12-01 LAB — POC MICROSCOPIC URINALYSIS (UMFC): Mucus: ABSENT

## 2014-12-01 LAB — POCT URINALYSIS DIP (MANUAL ENTRY)
BILIRUBIN UA: NEGATIVE
BILIRUBIN UA: NEGATIVE
GLUCOSE UA: NEGATIVE
Leukocytes, UA: NEGATIVE
Nitrite, UA: NEGATIVE
Protein Ur, POC: NEGATIVE
Urobilinogen, UA: 0.2
pH, UA: 6

## 2014-12-01 NOTE — Patient Instructions (Signed)
At this time it does not look like you have a UTI- however I will be in touch when your culture comes in.  We will also be in touch regarding your other labs  I agree that it sounds like you need a colonoscopy- I would advise you to call your insurance company and explain your situation.  They may be able to help you figure out a way to get this done

## 2014-12-01 NOTE — Progress Notes (Signed)
[facility] [address], [place] [address] [phone]  Date:  12/01/2014   Name:  Robin Mora   DOB:  Jan 23, 1991   MRN:  [id number]  PCP:  No primary care provider on file.    Chief Complaint: STD testing; frequent urination; and Referral to GI doctor   History of Present Illness:  Robin Mora is a 24 y.o. very pleasant female patient who presents with the following:  She was treated for trick on 9/21 with flagyl.  Partner treated as well.  Seen again on 10/1 with concern about vaginal yeast - she was noted to have a yeast infection and was treated with diflucan.    Here today with concern of possible UTI.  She notes mild discomfort with urination for about 2 weeks.  She has noted urinary urgency and frequency.  She thinks this might be due to drinking sodas- she is trying to drink more water.  She denies any genital lesion, pain or discharge.   LMP was 10 days ago.    She would like STD testing today as well.   No vomiting, no abd pain, no fever  She is also interested in a GI referral. She has had blood in her stool for quite some time.  It turns out that her concern is actually the cost of colonoscopy and she wonders if there might be an office that offers this service more cheaply.  Advised her that the colonoscopy will probably cost about the same with any office.  Her best bet would be to discuss with her insurance company  Patient Active Problem List   Diagnosis Date Noted  . Large breasts 05/07/2013  . Obesity, unspecified 05/07/2013    No past medical history on file.  No past surgical history on file.  Social History  Substance Use Topics  . Smoking status: Never Smoker   . Smokeless tobacco: Never Used  . Alcohol Use: No    Family History  Problem Relation Age of Onset  . Hyperlipidemia Father   . Hypertension Father   . Sleep apnea Father   . Diabetes Maternal Grandmother   . Diabetes Paternal Grandmother   . Hypertension  Paternal Grandmother   . Heart disease Paternal Grandfather   . Hypertension Paternal Grandfather   . Kidney disease Paternal Grandfather   . Diabetes Paternal Grandfather     No Known Allergies  Medication list has been reviewed and updated.  Current Outpatient Prescriptions on File Prior to Visit  Medication Sig Dispense Refill  . fluconazole (DIFLUCAN) 150 MG tablet Take 1 tablet (150 mg total) by mouth once. Repeat in one week if needed (Patient not taking: Reported on 12/01/2014) 2 tablet 0   No current facility-administered medications on file prior to visit.    Review of Systems:  As per HPI- otherwise negative.   Physical Examination: Filed Vitals:   12/01/14 1634  BP: 120/74  Pulse: 90  Temp: 98.4 F (36.9 C)  Resp: 16   Filed Vitals:   12/01/14 1634  Height:  (1.651 m)  Weight: 240 lb 9.6 oz (109.135 kg)   Body mass index is 40.04 kg/(m^2). Ideal Body Weight: Weight in (lb) to have BMI = 25: 149.9  GEN: WDWN, NAD, Non-toxic, A & O x 3, obese, looks well HEENT: Atraumatic, Normocephalic. Neck supple. No masses, No LAD. Ears and Nose: No external deformity. CV: RRR, No M/G/R. No JVD. No thrill. No extra heart sounds. PULM: CTA B,  no wheezes, crackles, rhonchi. No retractions. No resp. distress. No accessory muscle use. ABD: S, NT, ND, +BS. No rebound. No HSM.  Benign abdomen EXTR: No c/c/e NEURO Normal gait.  PSYCH: Normally interactive. Conversant. Not depressed or anxious appearing.  Calm demeanor.   Results for orders placed or performed in visit on 12/01/14  POCT urinalysis dipstick  Result Value Ref Range   Color, UA yellow yellow   Clarity, UA cloudy (A) clear   Glucose, UA negative negative   Bilirubin, UA negative negative   Ketones, POC UA negative negative   Spec Grav, UA >=1.030    Blood, UA trace-intact (A) negative   pH, UA 6.0    Protein Ur, POC negative negative   Urobilinogen, UA 0.2    Nitrite, UA Negative Negative    Leukocytes, UA Negative Negative  POCT Microscopic Urinalysis ([facility])  Result Value Ref Range   WBC,UR,HPF,POC Few (A) None WBC/hpf   RBC,UR,HPF,POC None None RBC/hpf   Bacteria None None, Too numerous to count   Mucus Absent Absent   Epithelial Cells, UR Per Microscopy Few (A) None, Too numerous to count cells/hpf     Assessment and Plan: Dysuria - Plan: POCT urinalysis dipstick, POCT Microscopic Urinalysis ([facility]), Urine culture  Screening for STD (sexually transmitted disease) - Plan: GC/Chlamydia Probe Amp, Hepatitis B surface antibody, Hepatitis B surface antigen, Hepatitis C antibody, HIV antibody, RPR  At this time do not suspect a UTI, but will await her culture.   Await other STI testing  Discussed strategies to help her afford her colonoscopy   Signed Abbe AmsterdamJessica Estalee Mccandlish, MD

## 2014-12-02 LAB — GC/CHLAMYDIA PROBE AMP
CT PROBE, AMP APTIMA: NEGATIVE
GC Probe RNA: NEGATIVE

## 2014-12-02 LAB — HEPATITIS B SURFACE ANTIGEN: HEP B S AG: NEGATIVE

## 2014-12-02 LAB — RPR

## 2014-12-02 LAB — HEPATITIS B SURFACE ANTIBODY, QUANTITATIVE: HEPATITIS B-POST: 10.3 m[IU]/mL

## 2014-12-02 LAB — HEPATITIS C ANTIBODY: HCV AB: NEGATIVE

## 2014-12-02 LAB — HIV ANTIBODY (ROUTINE TESTING W REFLEX): HIV 1&2 Ab, 4th Generation: NONREACTIVE

## 2014-12-03 LAB — URINE CULTURE: Colony Count: 1000

## 2014-12-22 ENCOUNTER — Ambulatory Visit (INDEPENDENT_AMBULATORY_CARE_PROVIDER_SITE_OTHER): Payer: BC Managed Care – PPO | Admitting: Physician Assistant

## 2014-12-22 VITALS — BP 110/70 | HR 73 | Temp 98.2°F | Resp 16 | Ht 64.25 in | Wt 239.0 lb

## 2014-12-22 DIAGNOSIS — N898 Other specified noninflammatory disorders of vagina: Secondary | ICD-10-CM

## 2014-12-22 DIAGNOSIS — Z7251 High risk heterosexual behavior: Secondary | ICD-10-CM | POA: Diagnosis not present

## 2014-12-22 DIAGNOSIS — A499 Bacterial infection, unspecified: Secondary | ICD-10-CM

## 2014-12-22 DIAGNOSIS — N760 Acute vaginitis: Secondary | ICD-10-CM | POA: Diagnosis not present

## 2014-12-22 DIAGNOSIS — B9689 Other specified bacterial agents as the cause of diseases classified elsewhere: Secondary | ICD-10-CM

## 2014-12-22 LAB — POCT WET + KOH PREP
TRICH BY WET PREP: ABSENT
YEAST BY WET PREP: ABSENT
Yeast by KOH: ABSENT

## 2014-12-22 MED ORDER — METRONIDAZOLE 500 MG PO TABS: 500.0000 mg | ORAL_TABLET | Freq: Two times a day (BID) | ORAL | Status: DC

## 2014-12-22 NOTE — Progress Notes (Signed)
12/23/2014 10:56 AM   DOB: 08/04/1990 / MRN: [id number]  SUBJECTIVE:  Vaginal Discharge The patient's primary symptoms include vaginal discharge. This is a new problem. The current episode started yesterday. The problem has been gradually worsening. The pain is mild. She is not pregnant. Pertinent negatives include no abdominal pain, anorexia, back pain, chills, constipation, diarrhea, discolored urine, dysuria, fever, flank pain, frequency, headaches, hematuria, joint pain, nausea, painful intercourse, rash, urgency or vomiting. The vaginal discharge was brown and milky. There has been no bleeding. She has not been passing clots. She has not been passing tissue. Nothing aggravates the symptoms. She is sexually active. It is possible that her partner has an STD. She uses nothing for contraception. Her menstrual history has been regular.      She has No Known Allergies.   She  has no past medical history on file.    She  reports that she has never smoked. She has never used smokeless tobacco. She reports that she does not drink alcohol or use illicit drugs. She  reports that she currently engages in sexual activity and has had female partners. She reports using the following method of birth control/protection: Condom. The patient  has no past surgical history on file.  Her family history includes Diabetes in her maternal grandmother, paternal grandfather, and paternal grandmother; Heart disease in her paternal grandfather; Hyperlipidemia in her father; Hypertension in her father, paternal grandfather, and paternal grandmother; Kidney disease in her paternal grandfather; Sleep apnea in her father.  Review of Systems  Constitutional: Negative for fever and chills.  Gastrointestinal: Negative for nausea, vomiting, abdominal pain, diarrhea, constipation and anorexia.  Genitourinary: Positive for vaginal discharge. Negative for dysuria, urgency, frequency, hematuria and flank pain.  Musculoskeletal:  Negative for back pain and joint pain.  Skin: Negative for rash.  Neurological: Negative for headaches.    Problem list and medications reviewed and updated by myself where necessary, and exist elsewhere in the encounter.   OBJECTIVE:  BP 110/70 mmHg  Pulse 73  Temp(Src) 98.2 F (36.8 C) (Oral)  Resp 16  Ht 5' 4.25" (1.632 m)  Wt 239 lb (108.41 kg)  BMI 40.70 kg/m2  SpO2 99%  LMP 12/14/2014 CrCl cannot be calculated (Patient has no serum creatinine result on file.).  Physical Exam  Cardiovascular: Normal rate and regular rhythm.   Pulmonary/Chest: Effort normal and breath sounds normal.  Abdominal: She exhibits no distension and no mass. There is no tenderness. There is no rebound and no guarding.  Genitourinary: Pelvic exam was performed with patient prone. There is no rash, tenderness or lesion on the right labia. There is no rash, tenderness or lesion on the left labia. Cervix exhibits discharge. Cervix exhibits no motion tenderness and no friability. Right adnexum displays no mass, no tenderness and no fullness. Left adnexum displays no mass, no tenderness and no fullness. Vaginal discharge (Mucopurulent) found.  Nursing note and vitals reviewed.   Results for orders placed or performed in visit on 12/22/14 (from the past 48 hour(s))  POCT Wet + KOH Prep     Status: Abnormal   Collection Time: 12/22/14  6:17 PM  Result Value Ref Range   Yeast by KOH Absent Present, Absent   Yeast by wet prep Absent Present, Absent   WBC by wet prep Moderate (A) None, Few, Too numerous to count   Clue Cells Wet Prep HPF POC Moderate (A) None, Too numerous to count   Trich by wet prep Absent Present, Absent  Bacteria Wet Prep HPF POC Moderate (A) None, Few, Too numerous to count   Epithelial Cells By [employer] Pref ([facility]) Moderate (A) None, Few, Too numerous to count   RBC,UR,HPF,POC None None RBC/hpf    ASSESSMENT AND PLAN  Robin Mora was seen today for vaginal discharge.  Diagnoses and all  orders for this visit:  Vaginal discharge -     POCT Wet + KOH Prep -     GC/Chlamydia Probe Amp  History of unprotected sex: Patient recently screened negative for STI by Dr. Patsy Lageropland.   -     Cancel: HIV antibody -     Cancel: RPR  BV -     metroNIDAZOLE (FLAGYL) 500 MG tablet; Take 1 tablet (500 mg total) by mouth 2 (two) times daily. Do not consume alcohol while taking this antibiotic.    The patient was advised to call or return to clinic if she does not see an improvement in symptoms or to seek the care of the closest emergency department if she worsens with the above plan.   Deliah BostonMichael Annleigh Knueppel, MHS, PA-C [facility] 12/23/2014 10:56 AM

## 2014-12-24 LAB — GC/CHLAMYDIA PROBE AMP
CT Probe RNA: NEGATIVE
GC PROBE AMP APTIMA: NEGATIVE

## 2014-12-26 ENCOUNTER — Encounter: Payer: Self-pay | Admitting: Family Medicine

## 2014-12-27 ENCOUNTER — Ambulatory Visit (INDEPENDENT_AMBULATORY_CARE_PROVIDER_SITE_OTHER): Payer: BC Managed Care – PPO | Admitting: Urgent Care

## 2014-12-27 VITALS — BP 132/80 | HR 111 | Temp 98.6°F | Resp 16 | Ht 65.0 in | Wt 237.0 lb

## 2014-12-27 DIAGNOSIS — Z202 Contact with and (suspected) exposure to infections with a predominantly sexual mode of transmission: Secondary | ICD-10-CM

## 2014-12-27 MED ORDER — AZITHROMYCIN 500 MG PO TABS: 500.0000 mg | ORAL_TABLET | Freq: Every day | ORAL | Status: DC

## 2014-12-27 NOTE — Progress Notes (Signed)
    MRN: [id number] DOB: 08/25/1990  Subjective:   Robin Mora is a 24 y.o. female presenting for chief complaint of Exposure to STD  Reports exposure to confirmed case of chlamydia. Patient tested negative for chlamydia on 12/22/2014. Denies fever, pelvic pain, abnormal vaginal discharge, n/v, abdominal pain. She did have abnormal vaginal discharge at her last visit which is now improved, diagnosed with bacterial vaginosis, started on Flagyl. Denies any other aggravating or relieving factors, no other questions or concerns.  Estanislado PandySaadia has a current medication list which includes the following prescription(s): metronidazole. Also has No Known Allergies.  Estanislado PandySaadia  has no past medical history on file. Also  has no past surgical history on file.  Objective:   Vitals: BP 132/80 mmHg  Pulse 111  Temp(Src) 98.6 F (37 C) (Oral)  Resp 16  Ht 5\' 5"  (1.651 m)  Wt 237 lb (107.502 kg)  BMI 39.44 kg/m2  SpO2 96%  LMP 12/14/2014  Pulse 96 on recheck by PA-Lakrista Scaduto.  Physical Exam  Constitutional: She is oriented to person, place, and time. She appears well-developed and well-nourished.  Cardiovascular: Normal rate.   Pulmonary/Chest: Effort normal.  Neurological: She is alert and oriented to person, place, and time.  Skin: Skin is warm and dry. No rash noted. No erythema. No pallor.  Psychiatric: She has a normal mood and affect.   Assessment and Plan :   1. Exposure to STD - Take 1g Azithromycin to cover for chlamydia, retest in 4 weeks to make sure patient has cleared infection. Continue taking Flagyl for BV. Also recommended using probiotic. Call if yeast infection develops, okay to send rx for diflucan if necessary.  Wallis BambergMario Rondal Vandevelde, PA-C [facility] [phone] 12/27/2014 9:14 AM

## 2014-12-27 NOTE — Patient Instructions (Addendum)
Chlamydia, Female °Chlamydia is an infection. It is spread through sexual contact. Chlamydia can be in different areas of the body. These areas include the cervix, urethra, throat, or rectum. You may not know you have chlamydia because many people never develop the symptoms. Chlamydia is not difficult to treat once you know you have it. However, if it is left untreated, chlamydia can lead to more serious health problems.  °CAUSES  °Chlamydia is caused by bacteria. It is a sexually transmitted disease. It is passed from an infected partner during intimate contact. This contact could be with the genitals, mouth, or rectal area. Chlamydia can also be passed from mothers to babies during birth. °SIGNS AND SYMPTOMS  °There may not be any symptoms. This is often the case early in the infection. If symptoms develop, they may include: °· Mild pain and discomfort when urinating. °· Redness, soreness, and swelling (inflammation) of the rectum. °· Vaginal discharge. °· Painful intercourse. °· Abdominal pain. °· Bleeding between menstrual periods. °DIAGNOSIS  °To diagnose this infection, your health care provider will do a pelvic exam. Cultures will be taken of the vagina, cervix, urine, and possibly the rectum to verify the diagnosis.  °TREATMENT °You will be given antibiotic medicines. If you are pregnant, certain types of antibiotics will need to be avoided. Any sexual partners should also be treated, even if they do not show symptoms. Your health care provider may test you for infection again 3 months after treatment. °HOME CARE INSTRUCTIONS  °· Take your antibiotic medicine as directed by your health care provider. Finish the antibiotic even if you start to feel better. °· Take medicines only as directed by your health care provider. °· Inform any sexual partners about the infection. They should also be treated. °· Do not have sexual contact until your health care provider tells you it is okay. °· Get plenty of  rest. °· Eat a well-balanced diet. °· Drink enough fluids to keep your urine clear or pale yellow. °· Keep all follow-up visits as directed by your health care provider. °SEEK MEDICAL CARE IF: °· You have painful urination. °· You have abdominal pain. °· You have vaginal discharge. °· You have painful sexual intercourse. °· You have bleeding between periods and after sex. °· You have a fever. °SEEK IMMEDIATE MEDICAL CARE IF:  °· You experience nausea or vomiting. °· You experience excessive sweating (diaphoresis). °· You have difficulty swallowing. °  °This information is not intended to replace advice given to you by your health care provider. Make sure you discuss any questions you have with your health care provider. °  °Document Released: 11/01/2004 Document Revised: 10/13/2014 Document Reviewed: 09/29/2012 °Elsevier Interactive Patient Education ©2016 [employer] ° °Azithromycin tablets °What is this medicine? °AZITHROMYCIN (az ith roe MYE sin) is a macrolide antibiotic. It is used to treat or prevent certain kinds of bacterial infections. It will not work for colds, flu, or other viral infections. °This medicine may be used for other purposes; ask your health care provider or pharmacist if you have questions. °What should I tell my health care provider before I take this medicine? °They need to know if you have any of these conditions: °-kidney disease °-liver disease °-irregular heartbeat or heart disease °-an unusual or allergic reaction to azithromycin, erythromycin, other macrolide antibiotics, foods, dyes, or preservatives °-pregnant or trying to get pregnant °-breast-feeding °How should I use this medicine? °Take this medicine by mouth with a full glass of water. Follow the directions on the   prescription label. The tablets can be taken with food or on an empty stomach. If the medicine upsets your stomach, take it with food. Take your medicine at regular intervals. Do not take your medicine more often  than directed. Take all of your medicine as directed even if you think your are better. Do not skip doses or stop your medicine early. Talk to your pediatrician regarding the use of this medicine in children. Special care may be needed. °Overdosage: If you think you have taken too much of this medicine contact a poison control center or emergency room at once. °NOTE: This medicine is only for you. Do not share this medicine with others. °What if I miss a dose? °If you miss a dose, take it as soon as you can. If it is almost time for your next dose, take only that dose. Do not take double or extra doses. °What may interact with this medicine? °Do not take this medicine with any of the following medications: °-lincomycin °This medicine may also interact with the following medications: °-amiodarone °-antacids °-birth control pills °-cyclosporine °-digoxin °-magnesium °-nelfinavir °-phenytoin °-warfarin °This list may not describe all possible interactions. Give your health care provider a list of all the medicines, herbs, non-prescription drugs, or dietary supplements you use. Also tell them if you smoke, drink alcohol, or use illegal drugs. Some items may interact with your medicine. °What should I watch for while using this medicine? °Tell your doctor or health care professional if your symptoms do not improve. °Do not treat diarrhea with over the counter products. Contact your doctor if you have diarrhea that lasts more than 2 days or if it is severe and watery. °This medicine can make you more sensitive to the sun. Keep out of the sun. If you cannot avoid being in the sun, wear protective clothing and use sunscreen. Do not use sun lamps or tanning beds/booths. °What side effects may I notice from receiving this medicine? °Side effects that you should report to your doctor or health care professional as soon as possible: °-allergic reactions like skin rash, itching or hives, swelling of the face, lips, or  tongue °-confusion, nightmares or hallucinations °-dark urine °-difficulty breathing °-hearing loss °-irregular heartbeat or chest pain °-pain or difficulty passing urine °-redness, blistering, peeling or loosening of the skin, including inside the mouth °-white patches or sores in the mouth °-yellowing of the eyes or skin °Side effects that usually do not require medical attention (report to your doctor or health care professional if they continue or are bothersome): °-diarrhea °-dizziness, drowsiness °-headache °-stomach upset or vomiting °-tooth discoloration °-vaginal irritation °This list may not describe all possible side effects. Call your doctor for medical advice about side effects. You may report side effects to FDA at 1-800-FDA-1088. °Where should I keep my medicine? °Keep out of the reach of children. °Store at room temperature between 15 and 30 degrees C (59 and 86 degrees F). Throw away any unused medicine after the expiration date. °NOTE: This sheet is a summary. It may not cover all possible information. If you have questions about this medicine, talk to your doctor, pharmacist, or health care provider. °  °© 2016, Elsevier/[employer]. (2012-08-28 15:38:48) ° °

## 2015-03-14 ENCOUNTER — Encounter (HOSPITAL_COMMUNITY): Payer: Self-pay | Admitting: Emergency Medicine

## 2015-03-14 ENCOUNTER — Encounter: Payer: Self-pay | Admitting: Family Medicine

## 2015-03-14 ENCOUNTER — Emergency Department (HOSPITAL_COMMUNITY)
Admission: EM | Admit: 2015-03-14 | Discharge: 2015-03-14 | Disposition: A | Discharge status: Home / Self Care | Payer: BC Managed Care – PPO | Source: Home / Self Care | Attending: Family Medicine | Admitting: Family Medicine

## 2015-03-14 DIAGNOSIS — R300 Dysuria: Secondary | ICD-10-CM | POA: Diagnosis not present

## 2015-03-14 LAB — POCT URINALYSIS DIP (DEVICE)
Glucose, UA: NEGATIVE mg/dL
HGB URINE DIPSTICK: NEGATIVE
Ketones, ur: NEGATIVE mg/dL
Leukocytes, UA: NEGATIVE
Nitrite: NEGATIVE
PH: 7 (ref 5.0–8.0)
PROTEIN: NEGATIVE mg/dL
SPECIFIC GRAVITY, URINE: 1.02 (ref 1.005–1.030)

## 2015-03-14 LAB — POCT PREGNANCY, URINE: Preg Test, Ur: NEGATIVE

## 2015-03-14 MED ORDER — PHENAZOPYRIDINE HCL 200 MG PO TABS: 200.0000 mg | ORAL_TABLET | Freq: Three times a day (TID) | ORAL | Status: DC

## 2015-03-14 NOTE — Discharge Instructions (Signed)

## 2015-03-14 NOTE — ED Notes (Signed)
The patient presented to the [facility] with a complaint of urinary urgency and frequency as well as lower back pain and lower right abdominal pain x 1 month.

## 2015-03-15 NOTE — ED Provider Notes (Signed)
CSN: [id number]     Arrival date & time 03/14/15  1907 History   First MD Initiated Contact with Patient 03/14/15 2000     Chief Complaint  Patient presents with  . Urinary Frequency  . Abdominal Pain  . Back Pain   (Consider location/radiation/quality/duration/timing/severity/associated sxs/prior Treatment) HPI History obtained from patient:   LOCATION:suprapubic SEVERITY:3 DURATION:3 days CONTEXT:onset 3 days ago with early morning urination. Sexually active, with no change in partners.  QUALITY: burning only with urination, and sensation that bladder is not empty MODIFYING FACTORS:none ASSOCIATED SYMPTOMS:none TIMING:episodic with urination OCCUPATION: teacher  History reviewed. No pertinent past medical history. History reviewed. No pertinent past surgical history. Family History  Problem Relation Age of Onset  . Hyperlipidemia Father   . Hypertension Father   . Sleep apnea Father   . Diabetes Maternal Grandmother   . Diabetes Paternal Grandmother   . Hypertension Paternal Grandmother   . Heart disease Paternal Grandfather   . Hypertension Paternal Grandfather   . Kidney disease Paternal Grandfather   . Diabetes Paternal Grandfather    Social History  Substance Use Topics  . Smoking status: Never Smoker   . Smokeless tobacco: Never Used  . Alcohol Use: No   OB History    No data available     Review of Systems ROS +'veDYSURIA  Denies: HEADACHE, NAUSEA, ABDOMINAL PAIN, CHEST PAIN, CONGESTION,  SHORTNESS OF BREATH  Allergies  Review of patient's allergies indicates no known allergies.  Home Medications   Prior to Admission medications   Medication Sig Start Date End Date Taking? Authorizing Provider  azithromycin (ZITHROMAX) 500 MG tablet Take 1 tablet (500 mg total) by mouth daily. 12/27/14   Wallis Bamberg, PA-C  metroNIDAZOLE (FLAGYL) 500 MG tablet Take 1 tablet (500 mg total) by mouth 2 (two) times daily. Do not consume alcohol while taking this  antibiotic. 12/22/14   Ofilia Neas, PA-C  phenazopyridine (PYRIDIUM) 200 MG tablet Take 1 tablet (200 mg total) by mouth 3 (three) times daily. 03/14/15   Tharon Aquas, PA   Meds Ordered and Administered this Visit  Medications - No data to display  BP 98/64 mmHg  Pulse 67  Temp(Src) 98.3 F (36.8 C) (Oral)  Resp 16  SpO2 100%  LMP 03/05/2015 (Exact Date) No data found.   Physical Exam NURSES NOTES AND VITAL SIGNS REVIEWED. CONSTITUTIONAL: Well developed, well nourished, no acute distress HEENT: normocephalic, atraumatic, right and left TM's are normal EYES: Conjunctiva normal NECK:normal ROM, supple, no adenopathy PULMONARY:No respiratory distress, normal effort, Lungs: CTAb/l, no wheezes, or increased work of breathing CARDIOVASCULAR: RRR, no murmur ABDOMEN: soft, ND, NT, +'ve BS, without CVA -T MUSCULOSKELETAL: Normal ROM of all extremities,  SKIN: warm and dry without rash PSYCHIATRIC: Mood and affect, behavior are normal  ED Course  Procedures (including critical care time)  Labs Review Labs Reviewed  POCT URINALYSIS DIP (DEVICE) - Abnormal; Notable for the following:    Bilirubin Urine SMALL (*)    All other components within normal limits  POCT PREGNANCY, URINE    Imaging Review No results found.   Visual Acuity Review  Right Eye Distance:   Left Eye Distance:   Bilateral Distance:    Right Eye Near:   Left Eye Near:    Bilateral Near:         MDM   1. Dysuria    Patient is advised to continue home symptomatic treatment. Prescription for pyridium  sent pharmacy patient has indicated. Patient is advised  that if there are new or worsening symptoms or attend the emergency department, or contact primary care provider. Instructions of care provided discharged home in stable condition. Return to work/school note provided. Will await culture result before starting antibx, as decided after options given to the patient.  THIS NOTE WAS GENERATED  USING A VOICE RECOGNITION SOFTWARE PROGRAM. ALL REASONABLE EFFORTS  WERE MADE TO PROOFREAD THIS DOCUMENT FOR ACCURACY.     Tharon Aquas, PA 03/15/15 1343

## 2015-03-22 ENCOUNTER — Ambulatory Visit (INDEPENDENT_AMBULATORY_CARE_PROVIDER_SITE_OTHER): Payer: BC Managed Care – PPO | Admitting: Family Medicine

## 2015-03-22 VITALS — BP 108/82 | HR 82 | Temp 99.6°F | Resp 16 | Ht 64.0 in | Wt 231.0 lb

## 2015-03-22 DIAGNOSIS — A499 Bacterial infection, unspecified: Secondary | ICD-10-CM | POA: Diagnosis not present

## 2015-03-22 DIAGNOSIS — N898 Other specified noninflammatory disorders of vagina: Secondary | ICD-10-CM | POA: Diagnosis not present

## 2015-03-22 DIAGNOSIS — N760 Acute vaginitis: Secondary | ICD-10-CM | POA: Diagnosis not present

## 2015-03-22 DIAGNOSIS — R300 Dysuria: Secondary | ICD-10-CM

## 2015-03-22 DIAGNOSIS — Z113 Encounter for screening for infections with a predominantly sexual mode of transmission: Secondary | ICD-10-CM

## 2015-03-22 DIAGNOSIS — N390 Urinary tract infection, site not specified: Secondary | ICD-10-CM

## 2015-03-22 DIAGNOSIS — B9689 Other specified bacterial agents as the cause of diseases classified elsewhere: Secondary | ICD-10-CM

## 2015-03-22 DIAGNOSIS — N9489 Other specified conditions associated with female genital organs and menstrual cycle: Secondary | ICD-10-CM | POA: Diagnosis not present

## 2015-03-22 LAB — POCT URINALYSIS DIP (MANUAL ENTRY)
Bilirubin, UA: NEGATIVE
Glucose, UA: NEGATIVE
Ketones, POC UA: NEGATIVE
Leukocytes, UA: NEGATIVE
Nitrite, UA: POSITIVE — AB
Protein Ur, POC: >=300 — AB
Spec Grav, UA: 1.025
Urobilinogen, UA: 1
pH, UA: 7

## 2015-03-22 LAB — POCT WET + KOH PREP
Trich by wet prep: ABSENT
Yeast by KOH: ABSENT
Yeast by wet prep: ABSENT

## 2015-03-22 LAB — POC MICROSCOPIC URINALYSIS (UMFC): Mucus: ABSENT

## 2015-03-22 MED ORDER — CEPHALEXIN 500 MG PO CAPS: 500.0000 mg | ORAL_CAPSULE | Freq: Two times a day (BID) | ORAL | Status: DC

## 2015-03-22 MED ORDER — FLUCONAZOLE 150 MG PO TABS: 150.0000 mg | ORAL_TABLET | Freq: Every day | ORAL | Status: DC

## 2015-03-22 MED ORDER — METRONIDAZOLE 500 MG PO TABS: 500.0000 mg | ORAL_TABLET | Freq: Two times a day (BID) | ORAL | Status: DC

## 2015-03-22 NOTE — Progress Notes (Signed)
Chief Complaint:  Chief Complaint  Patient presents with  . Dysuria  . Exposure to STD    possible STD  . Vaginal Discharge    minor    HPI: Robin Mora is a 25 y.o. female who reports to [facility] today complaining of vaginal dcminimla, pain with uriantion, odor. Intense pelvic pain, bad pressure when she uriantes.  No fevers cahill nausea , vomiting, back pain. No rashes.  She has had trichmonas and also chlamydia. She just got prohylactically treated.  She wants STD testing  History reviewed. No pertinent past medical history. History reviewed. No pertinent past surgical history. Social History   Social History  . Marital Status: Single    Spouse Name: n/a  . Number of Children: 0  . Years of Education: college   Occupational History  . Teacher     Administrator   Social History Main Topics  . Smoking status: Never Smoker   . Smokeless tobacco: Never Used  . Alcohol Use: No  . Drug Use: No  . Sexual Activity:    Partners: Male    Birth Control/ Protection: Condom     Comment: inconsistent condom use   Other Topics Concern  . None   Social History Narrative   In graduate program at [employer] and anticipates a Master's Degree in [place] and African American Literature 06/2015. Her undergraduate degree in [employer] is from [employer].   Lives with her sister.   Family History  Problem Relation Age of Onset  . Hyperlipidemia Father   . Hypertension Father   . Sleep apnea Father   . Diabetes Maternal Grandmother   . Diabetes Paternal Grandmother   . Hypertension Paternal Grandmother   . Heart disease Paternal Grandfather   . Hypertension Paternal Grandfather   . Kidney disease Paternal Grandfather   . Diabetes Paternal Grandfather    No Known Allergies Prior to Admission medications   Medication Sig Start Date End Date Taking? Authorizing Provider  phenazopyridine (PYRIDIUM) 200 MG tablet Take 1 tablet (200 mg total) by mouth 3 (three) times  daily. 03/14/15  Yes Tharon Aquas, PA  azithromycin (ZITHROMAX) 500 MG tablet Take 1 tablet (500 mg total) by mouth daily. Patient not taking: Reported on 03/22/2015 12/27/14   Wallis Bamberg, PA-C  metroNIDAZOLE (FLAGYL) 500 MG tablet Take 1 tablet (500 mg total) by mouth 2 (two) times daily. Do not consume alcohol while taking this antibiotic. Patient not taking: Reported on 03/22/2015 12/22/14   Ofilia Neas, PA-C     ROS: The patient denies fevers, chills, night sweats, unintentional weight loss, chest pain, palpitations, wheezing, dyspnea on exertion, nausea, vomiting, abdominal pain, dysuria, hematuria, melena, numbness, weakness, or tingling.  All other systems have been reviewed and were otherwise negative with the exception of those mentioned in the HPI and as above.    PHYSICAL EXAM: Filed Vitals:   03/22/15 1640  BP: 108/82  Pulse: 82  Temp: 99.6 F (37.6 C)  Resp: 16   Body mass index is 39.63 kg/(m^2).   General: Alert, no acute distress HEENT:  Normocephalic, atraumatic, oropharynx patent. EOMI, PERRLA Cardiovascular:  Regular rate and rhythm, no rubs murmurs or gallops.  No Carotid bruits, radial pulse intact. No pedal edema.  Respiratory: Clear to auscultation bilaterally.  No wheezes, rales, or rhonchi.  No cyanosis, no use of accessory musculature Abdominal: No organomegaly, abdomen is soft and non-tender, positive bowel sounds. No masses. Skin: No rashes. Neurologic: Facial musculature symmetric.  Psychiatric: Patient acts appropriately throughout our interaction. Lymphatic: No cervical or submandibular lymphadenopathy Musculoskeletal: Gait intact. No edema, tenderness GU-+ white and yellow dc with odor.  Neg cmt, neg masses , lesions  LABS: Results for orders placed or performed in visit on 03/22/15  POCT Microscopic Urinalysis ([facility])  Result Value Ref Range   WBC,UR,HPF,POC Few (A) None WBC/hpf   RBC,UR,HPF,POC Too numerous to count  (A) None RBC/hpf    Bacteria Few (A) None, Too numerous to count   Mucus Absent Absent   Epithelial Cells, UR Per Microscopy Few (A) None, Too numerous to count cells/hpf  POCT urinalysis dipstick  Result Value Ref Range   Color, UA yellow yellow   Clarity, UA cloudy (A) clear   Glucose, UA negative negative   Bilirubin, UA negative negative   Ketones, POC UA negative negative   Spec Grav, UA 1.025    Blood, UA large (A) negative   pH, UA 7.0    Protein Ur, POC >=300 (A) negative   Urobilinogen, UA 1.0    Nitrite, UA Positive (A) Negative   Leukocytes, UA Negative Negative  POCT Wet + KOH Prep  Result Value Ref Range   Yeast by KOH Absent Present, Absent   Yeast by wet prep Absent Present, Absent   WBC by wet prep Few None, Few, Too numerous to count   Clue Cells Wet Prep HPF POC Moderate (A) None, Too numerous to count   Trich by wet prep Absent Present, Absent   Bacteria Wet Prep HPF POC Moderate (A) None, Few, Too numerous to count   Epithelial Cells By [employer] ([facility]) Many (A) None, Few, Too numerous to count   RBC,UR,HPF,POC None None RBC/hpf     EKG/XRAY:   Primary read interpreted by Dr. Conley Rolls at [facility].   ASSESSMENT/PLAN: Encounter Diagnoses  Name Primary?  . Vaginal discharge Yes  . Dysuria   . Vaginal odor   . Screening for STD (sexually transmitted disease)    Rx Flagyl first, await urine cx to see if contaminant.  Urine cx and STD labs pending Fu prn    Gross sideeffects, risk and benefits, and alternatives of medications d/w patient. Patient is aware that all medications have potential sideeffects and we are unable to predict every sideeffect or drug-drug interaction that may occur.  Thao Le DO  03/22/2015 5:57 PM

## 2015-03-22 NOTE — Patient Instructions (Signed)
Bacterial Vaginosis °Bacterial vaginosis is a vaginal infection that occurs when the normal balance of bacteria in the vagina is disrupted. It results from an overgrowth of certain bacteria. This is the most common vaginal infection in women of childbearing age. Treatment is important to prevent complications, especially in pregnant women, as it can cause a premature delivery. °CAUSES  °Bacterial vaginosis is caused by an increase in harmful bacteria that are normally present in smaller amounts in the vagina. Several different kinds of bacteria can cause bacterial vaginosis. However, the reason that the condition develops is not fully understood. °RISK FACTORS °Certain activities or behaviors can put you at an increased risk of developing bacterial vaginosis, including: °· Having a new sex partner or multiple sex partners. °· Douching. °· Using an intrauterine device (IUD) for contraception. °Women do not get bacterial vaginosis from toilet seats, bedding, swimming pools, or contact with objects around them. °SIGNS AND SYMPTOMS  °Some women with bacterial vaginosis have no signs or symptoms. Common symptoms include: °· Grey vaginal discharge. °· A fishlike odor with discharge, especially after sexual intercourse. °· Itching or burning of the vagina and vulva. °· Burning or pain with urination. °DIAGNOSIS  °Your health care provider will take a medical history and examine the vagina for signs of bacterial vaginosis. A sample of vaginal fluid may be taken. Your health care provider will look at this sample under a microscope to check for bacteria and abnormal cells. A vaginal pH test may also be done.  °TREATMENT  °Bacterial vaginosis may be treated with antibiotic medicines. These may be given in the form of a pill or a vaginal cream. A second round of antibiotics may be prescribed if the condition comes back after treatment. Because bacterial vaginosis increases your risk for sexually transmitted diseases, getting  treated can help reduce your risk for chlamydia, gonorrhea, HIV, and herpes. °HOME CARE INSTRUCTIONS  °· Only take over-the-counter or prescription medicines as directed by your health care provider. °· If antibiotic medicine was prescribed, take it as directed. Make sure you finish it even if you start to feel better. °· Tell all sexual partners that you have a vaginal infection. They should see their health care provider and be treated if they have problems, such as a mild rash or itching. °· During treatment, it is important that you follow these instructions: °¨ Avoid sexual activity or use condoms correctly. °¨ Do not douche. °¨ Avoid alcohol as directed by your health care provider. °¨ Avoid breastfeeding as directed by your health care provider. °SEEK MEDICAL CARE IF:  °· Your symptoms are not improving after 3 days of treatment. °· You have increased discharge or pain. °· You have a fever. °MAKE SURE YOU:  °· Understand these instructions. °· Will watch your condition. °· Will get help right away if you are not doing well or get worse. °FOR MORE INFORMATION  °Centers for Disease Control and Prevention, Division of STD Prevention: www.cdc.gov/std °American Sexual Health Association (ASHA): www.ashastd.org  °  °This information is not intended to replace advice given to you by your health care provider. Make sure you discuss any questions you have with your health care provider. °  °Document Released: 01/22/2005 Document Revised: 02/12/2014 Document Reviewed: 09/03/2012 °Elsevier Interactive Patient Education ©2016 Elsevier Inc. ° °

## 2015-03-23 ENCOUNTER — Telehealth: Payer: Self-pay

## 2015-03-23 LAB — HIV ANTIBODY (ROUTINE TESTING W REFLEX): HIV 1&2 Ab, 4th Generation: NONREACTIVE

## 2015-03-23 LAB — GC/CHLAMYDIA PROBE AMP
CT Probe RNA: NOT DETECTED
GC Probe RNA: NOT DETECTED

## 2015-03-23 NOTE — Telephone Encounter (Signed)
Wrote note per Lanier Clam, PA-C.  Left VM informing pt note is ready to be picked up.

## 2015-03-23 NOTE — Telephone Encounter (Addendum)
Pt would like a note stating she may have to go to the restroom more frequently than usual due to her illness. Please call [phone] and pt was told it could take up to 72 hrs.

## 2015-03-24 LAB — HSV(HERPES SIMPLEX VRS) I + II AB-IGG
HSV 1 Glycoprotein G Ab, IgG: 0.11 IV
HSV 2 Glycoprotein G Ab, IgG: <0.1 IV

## 2015-03-24 LAB — RPR

## 2015-03-25 ENCOUNTER — Other Ambulatory Visit: Payer: Self-pay | Admitting: Family Medicine

## 2015-03-25 ENCOUNTER — Encounter: Payer: Self-pay | Admitting: Family Medicine

## 2015-03-25 LAB — URINE CULTURE: Colony Count: >=100000

## 2015-03-25 MED ORDER — CEPHALEXIN 500 MG PO CAPS: 500.0000 mg | ORAL_CAPSULE | Freq: Two times a day (BID) | ORAL | Status: DC

## 2015-03-29 ENCOUNTER — Other Ambulatory Visit: Payer: Self-pay | Admitting: Family Medicine

## 2015-06-14 ENCOUNTER — Encounter: Payer: Self-pay | Admitting: Urgent Care

## 2015-06-14 DIAGNOSIS — K529 Noninfective gastroenteritis and colitis, unspecified: Secondary | ICD-10-CM

## 2015-10-07 ENCOUNTER — Emergency Department (HOSPITAL_COMMUNITY): Payer: BC Managed Care – PPO

## 2015-10-07 ENCOUNTER — Encounter (HOSPITAL_COMMUNITY): Payer: Self-pay | Admitting: Emergency Medicine

## 2015-10-07 ENCOUNTER — Emergency Department (HOSPITAL_COMMUNITY)
Admission: EM | Admit: 2015-10-07 | Discharge: 2015-10-07 | Disposition: A | Discharge status: Home / Self Care | Payer: BC Managed Care – PPO | Attending: Emergency Medicine | Admitting: Emergency Medicine

## 2015-10-07 DIAGNOSIS — K529 Noninfective gastroenteritis and colitis, unspecified: Secondary | ICD-10-CM | POA: Diagnosis not present

## 2015-10-07 DIAGNOSIS — N9489 Other specified conditions associated with female genital organs and menstrual cycle: Secondary | ICD-10-CM | POA: Diagnosis not present

## 2015-10-07 DIAGNOSIS — Z79899 Other long term (current) drug therapy: Secondary | ICD-10-CM | POA: Diagnosis not present

## 2015-10-07 DIAGNOSIS — R1032 Left lower quadrant pain: Secondary | ICD-10-CM | POA: Diagnosis present

## 2015-10-07 LAB — COMPREHENSIVE METABOLIC PANEL
ALBUMIN: 3.4 g/dL — AB (ref 3.5–5.0)
ALK PHOS: 55 U/L (ref 38–126)
ALT: 11 U/L — ABNORMAL LOW (ref 14–54)
ANION GAP: 7 (ref 5–15)
AST: 16 U/L (ref 15–41)
BILIRUBIN TOTAL: 0.5 mg/dL (ref 0.3–1.2)
BUN: 8 mg/dL (ref 6–20)
CALCIUM: 8.6 mg/dL — AB (ref 8.9–10.3)
CO2: 26 mmol/L (ref 22–32)
Chloride: 105 mmol/L (ref 101–111)
Creatinine, Ser: 0.85 mg/dL (ref 0.44–1.00)
GFR calc Af Amer: >60 mL/min (ref >60)
GLUCOSE: 85 mg/dL (ref 65–99)
POTASSIUM: 3.3 mmol/L — AB (ref 3.5–5.1)
Sodium: 138 mmol/L (ref 135–145)
TOTAL PROTEIN: 7.5 g/dL (ref 6.5–8.1)

## 2015-10-07 LAB — CBC
HCT: 31 % — ABNORMAL LOW (ref 36.0–46.0)
HEMOGLOBIN: 10.1 g/dL — AB (ref 12.0–15.0)
MCH: 27.2 pg (ref 26.0–34.0)
MCHC: 32.6 g/dL (ref 30.0–36.0)
MCV: 83.3 fL (ref 78.0–100.0)
Platelets: 525 10*3/uL — ABNORMAL HIGH (ref 150–400)
RBC: 3.72 MIL/uL — ABNORMAL LOW (ref 3.87–5.11)
RDW: 13.9 % (ref 11.5–15.5)
WBC: 5.2 10*3/uL (ref 4.0–10.5)

## 2015-10-07 LAB — HCG, QUANTITATIVE, PREGNANCY

## 2015-10-07 LAB — POC OCCULT BLOOD, ED: FECAL OCCULT BLD: NEGATIVE

## 2015-10-07 LAB — LIPASE, BLOOD: Lipase: 41 U/L (ref 11–51)

## 2015-10-07 MED ORDER — ONDANSETRON 4 MG PO TBDP: 4.0000 mg | ORAL_TABLET | Freq: Three times a day (TID) | ORAL | 0 refills | Status: DC | PRN

## 2015-10-07 MED ORDER — SODIUM CHLORIDE 0.9 % IV SOLN: Freq: Once | INTRAVENOUS | Status: DC

## 2015-10-07 MED ORDER — POTASSIUM CHLORIDE CRYS ER 20 MEQ PO TBCR
30.0000 meq | EXTENDED_RELEASE_TABLET | Freq: Once | ORAL | Status: AC
  Administered 2015-10-07: 30 meq via ORAL
  Filled 2015-10-07: qty 1

## 2015-10-07 MED ORDER — IOPAMIDOL (ISOVUE-300) INJECTION 61%
100.0000 mL | Freq: Once | INTRAVENOUS | Status: AC | PRN
  Administered 2015-10-07: 100 mL via INTRAVENOUS

## 2015-10-07 MED ORDER — SODIUM CHLORIDE 0.9 % IV BOLUS (SEPSIS)
1000.0000 mL | Freq: Once | INTRAVENOUS | Status: AC
Start: 2015-10-07 — End: 2015-10-07
  Administered 2015-10-07: 1000 mL via INTRAVENOUS

## 2015-10-07 MED ORDER — OXYCODONE-ACETAMINOPHEN 5-325 MG PO TABS: 1.0000 | ORAL_TABLET | Freq: Four times a day (QID) | ORAL | 0 refills | Status: DC | PRN

## 2015-10-07 NOTE — Discharge Instructions (Signed)
There was evidence of colitis on the abdominal CT. Use the Percocet as needed for pain. Zofran as needed for nausea and vomiting. Follow-up with your GI specialist as soon as possible. Return to the ED should symptoms worsen.

## 2015-10-07 NOTE — ED Notes (Signed)
Patient ambulated to restroom but unable to get enough urine for specimen. Made Shawn PA aware.

## 2015-10-07 NOTE — ED Notes (Signed)
Pt has been informed about Urinalysis 

## 2015-10-07 NOTE — ED Provider Notes (Signed)
[facility]-EMERGENCY DEPT Provider Note   CSN: [id number] Arrival date & time: 10/07/15  1654     History   Chief Complaint No chief complaint on file.   HPI Robin Mora is a 25 y.o. female.  HPI   Robin Mora is a 25 y.o. female, with a history of colitis, presenting to the ED with nausea and vomiting for the last week. Pt also endorses increased diarrhea. Has vomiting or diarrhea 5-10 minutes after any food or beverage other than cold water.  Pt states she has chronic hematochezia due to her colitis, but states it is usually a trickle. Over the past week, she endorses bright red blood that "fills in the toilet water" with intermittent clots. Patient was on her menstrual cycle last week and states she only noticed the clots during the time of her menstrual cycle. Intermittent burning pain in the lower abdomen, worse in the LLQ.  No pain currently.    Sees Dr[facility] Noe GensPeters, GI specialist, [facility] in [place].     History reviewed. No pertinent past medical history.  Patient Active Problem List   Diagnosis Date Noted  . Colitis 06/14/2015  . Large breasts 05/07/2013  . Obesity, unspecified 05/07/2013    History reviewed. No pertinent surgical history.  OB History    No data available       Home Medications    Prior to Admission medications   Medication Sig Start Date End Date Taking? Authorizing Provider  Budesonide (UCERIS) 9 MG TB24 Take 9 mg by mouth daily.   Yes Historical Provider, MD  loperamide (IMODIUM A-D) 2 MG tablet Take 2 mg by mouth 4 (four) times daily as needed for diarrhea or loose stools.   Yes Historical Provider, MD  mesalamine (LIALDA) 1.2 g EC tablet Take 1.2 g by mouth daily with breakfast.   Yes Historical Provider, MD  Multiple Vitamin (MULTIVITAMIN WITH MINERALS) TABS tablet Take 1 tablet by mouth once.   Yes Historical Provider, MD  ondansetron (ZOFRAN ODT) 4 MG disintegrating tablet Take 1 tablet (4 mg total) by mouth every 8 (eight) hours  as needed for nausea or vomiting. 10/07/15   Shawn C Joy, PA-C  oxyCODONE-acetaminophen (PERCOCET/ROXICET) 5-325 MG tablet Take 1 tablet by mouth every 6 (six) hours as needed for severe pain. 10/07/15   Anselm PancoastShawn C Joy, PA-C    Family History Family History  Problem Relation Age of Onset  . Hyperlipidemia Father   . Hypertension Father   . Sleep apnea Father   . Diabetes Maternal Grandmother   . Diabetes Paternal Grandmother   . Hypertension Paternal Grandmother   . Heart disease Paternal Grandfather   . Hypertension Paternal Grandfather   . Kidney disease Paternal Grandfather   . Diabetes Paternal Grandfather     Social History Social History  Substance Use Topics  . Smoking status: Never Smoker  . Smokeless tobacco: Never Used  . Alcohol use No     Allergies   Review of patient's allergies indicates no known allergies.   Review of Systems Review of Systems  Constitutional: Negative for chills, diaphoresis and fever.  Respiratory: Negative for shortness of breath.   Cardiovascular: Negative for chest pain.  Gastrointestinal: Positive for abdominal pain (intermittent), blood in stool, diarrhea, nausea and vomiting.  Genitourinary: Negative for dysuria and hematuria.  Skin: Negative for color change and pallor.  Neurological: Negative for dizziness, weakness and light-headedness.  All other systems reviewed and are negative.    Physical Exam Updated Vital  Signs BP 122/62 (BP Location: Right Arm)   Pulse 95   Temp 98.4 F (36.9 C) (Oral)   Resp 16   LMP 10/03/2015   SpO2 100%   Physical Exam  Constitutional: She appears well-developed and well-nourished. No distress.  HENT:  Head: Normocephalic and atraumatic.  Eyes: Conjunctivae are normal.  Neck: Neck supple.  Cardiovascular: Normal rate, regular rhythm, normal heart sounds and intact distal pulses.   Pulmonary/Chest: Effort normal and breath sounds normal. No respiratory distress.  Abdominal: Soft. There is no  tenderness. There is no guarding.  Musculoskeletal: She exhibits no edema or tenderness.  Lymphadenopathy:    She has no cervical adenopathy.  Neurological: She is alert.  Skin: Skin is warm and dry. She is not diaphoretic.  Psychiatric: She has a normal mood and affect. Her behavior is normal.  Nursing note and vitals reviewed.    ED Treatments / Results  Labs (all labs ordered are listed, but only abnormal results are displayed) Labs Reviewed  COMPREHENSIVE METABOLIC PANEL - Abnormal; Notable for the following:       Result Value   Potassium 3.3 (*)    Calcium 8.6 (*)    Albumin 3.4 (*)    ALT 11 (*)    All other components within normal limits  CBC - Abnormal; Notable for the following:    RBC 3.72 (*)    Hemoglobin 10.1 (*)    HCT 31.0 (*)    Platelets 525 (*)    All other components within normal limits  LIPASE, BLOOD  HCG, QUANTITATIVE, PREGNANCY  URINALYSIS, ROUTINE W REFLEX MICROSCOPIC (NOT AT [facility])  POC OCCULT BLOOD, ED   Hemoglobin  Date Value Ref Range Status  10/07/2015 10.1 (L) 12.0 - 15.0 g/dL Final  16/11/9602 54.0 12.0 - 16.0 g/dL Final    Comment:    [facility]  12/15/2013 13.9 12.0 - 15.0 g/dL Final  98/12/9145 82.9 12.0 - 15.0 g/dL Final     EKG  EKG Interpretation None       Radiology Ct Abdomen Pelvis W Contrast  Result Date: 10/07/2015 CLINICAL DATA:  10 pound weight loss, nausea/ vomiting/ diarrhea, bright red blood in stool, colitis flare EXAM: CT ABDOMEN AND PELVIS WITH CONTRAST TECHNIQUE: Multidetector CT imaging of the abdomen and pelvis was performed using the standard protocol following bolus administration of intravenous contrast. CONTRAST:  ISOVUE-300 IOPAMIDOL (ISOVUE-300) INJECTION 61% COMPARISON:  None. FINDINGS: Lower chest:  Lung bases are clear. Hepatobiliary: Liver is within normal limits. Gallbladder is unremarkable. No intrahepatic or extrahepatic ductal dilatation. Pancreas: Within normal limits. Spleen: Within normal  limits. Adrenals/Urinary Tract: Adrenal glands are within normal limits. Kidneys are within normal limits.  No hydronephrosis. Bladder is within normal limits. Stomach/Bowel: Stomach is within normal limits. No evidence of bowel obstruction. Terminal ileum appears unremarkable. Wall thickening involving the ascending colon (coronal image 58). Ahaustral/tubular appearance on the descending colon (coronal image 58). Mild wall thickening involving the rectum (series 2/image 60). Vascular/Lymphatic: No evidence of abdominal aortic aneurysm. Small ileocolic lymph nodes in the right lower quadrant measuring up to 7 mm short axis (series 2/ image 44), likely reactive. No suspicious abdominopelvic lymphadenopathy. Reproductive: Uterus is within normal limits. Bilateral ovaries are within normal limits. Other: No abdominopelvic ascites. Musculoskeletal: Visualized osseous structures are within normal limits. IMPRESSION: Colonic wall thickening/inflammatory changes, predominantly involving the ascending colon, suggesting infectious or inflammatory colitis. Electronically Signed   By: Charline Bills M.D.   On: 10/07/2015 20:25    Procedures  Procedures (including critical care time)  Medications Ordered in ED Medications  0.9 %  sodium chloride infusion (not administered)  sodium chloride 0.9 % bolus 1,000 mL (0 mLs Intravenous Stopped 10/07/15 1930)  potassium chloride (K-DUR,KLOR-CON) CR tablet 30 mEq (30 mEq Oral Given 10/07/15 1924)  iopamidol (ISOVUE-300) 61 % injection 100 mL (100 mLs Intravenous Contrast Given 10/07/15 1955)     Initial Impression / Assessment and Plan / ED Course  I have reviewed the triage vital signs and the nursing notes.  Pertinent labs & imaging results that were available during my care of the patient were reviewed by me and considered in my medical decision making (see chart for details).  Clinical Course    Joli Koob presents with nausea, vomiting, and increased diarrhea  for the past week.  Findings and plan of care discussed with Loren Racer, MD.   Patient is nontoxic appearing, afebrile, not tachycardic, not tachypneic, not hypotensive, maintains SPO2 of 100% on room air, and is in no apparent distress. Patient has no signs of sepsis or other serious or life-threatening condition. Hemoccult negative, however, her hemoglobin is lower than normal. Patient is asymptomatic to this. Some signs of colitis on abdominal CT. Patient remained pain-free during her time in the ED with no instances of hematochezia. Patient advised to follow-up with her GI specialist. The patient was given instructions for home care as well as return precautions. Patient voices understanding of these instructions, accepts the plan, and is comfortable with discharge.   Vitals:   10/07/15 1702 10/07/15 1831 10/07/15 1926  BP: 122/62  122/76  Pulse: 95  86  Resp: 16  18  Temp: 98.4 F (36.9 C)    TempSrc: Oral    SpO2: 100%  100%  Weight:  93.9 kg   Height:  5' 4.5" (1.638 m)        Note: Lab called and asked if we could do a quantitative HCG rather than a I-STAT beta hCG, because the patient's blood can still be run in the main lab for the Quant hCG, but has timed out for the i-STAT.  Final Clinical Impressions(s) / ED Diagnoses   Final diagnoses:  Colitis    New Prescriptions New Prescriptions   ONDANSETRON (ZOFRAN ODT) 4 MG DISINTEGRATING TABLET    Take 1 tablet (4 mg total) by mouth every 8 (eight) hours as needed for nausea or vomiting.   OXYCODONE-ACETAMINOPHEN (PERCOCET/ROXICET) 5-325 MG TABLET    Take 1 tablet by mouth every 6 (six) hours as needed for severe pain.     Anselm Pancoast, PA-C 10/07/15 2240    Loren Racer, MD 10/09/15 [phone]

## 2015-10-07 NOTE — ED Triage Notes (Signed)
Patient presents for colitis flare up. Reports 10lb weight loss in one week, N/V/D, reports BRB in stool, generalized abdominal cramping x1 week.

## 2015-10-07 NOTE — ED Notes (Signed)
PA at bedside.

## 2015-12-14 ENCOUNTER — Ambulatory Visit (INDEPENDENT_AMBULATORY_CARE_PROVIDER_SITE_OTHER): Payer: BC Managed Care – PPO | Admitting: Family Medicine

## 2015-12-14 ENCOUNTER — Encounter: Payer: Self-pay | Admitting: Family Medicine

## 2015-12-14 VITALS — BP 122/84 | HR 86 | Temp 98.9°F | Resp 16 | Ht 64.57 in | Wt 209.0 lb

## 2015-12-14 DIAGNOSIS — Z113 Encounter for screening for infections with a predominantly sexual mode of transmission: Secondary | ICD-10-CM | POA: Diagnosis not present

## 2015-12-14 DIAGNOSIS — Z30019 Encounter for initial prescription of contraceptives, unspecified: Secondary | ICD-10-CM

## 2015-12-14 DIAGNOSIS — N898 Other specified noninflammatory disorders of vagina: Secondary | ICD-10-CM

## 2015-12-14 LAB — POCT URINE PREGNANCY: PREG TEST UR: NEGATIVE

## 2015-12-14 MED ORDER — ETONOGESTREL-ETHINYL ESTRADIOL 0.12-0.015 MG/24HR VA RING: VAGINAL_RING | VAGINAL | 12 refills | Status: DC

## 2015-12-14 NOTE — Patient Instructions (Signed)
     IF you received an x-ray today, you will receive an invoice from [facility]. Please contact  Radiology at [phone] with questions or concerns regarding your invoice.   IF you received labwork today, you will receive an invoice from [facility]. Please contact [facility] at [phone] with questions or concerns regarding your invoice.   Our billing staff will not be able to assist you with questions regarding bills from these companies.  You will be contacted with the lab results as soon as they are available. The fastest way to get your results is to activate your My Chart account. Instructions are located on the last page of this paperwork. If you have not heard from us regarding the results in 2 weeks, please contact this office.      

## 2015-12-14 NOTE — Progress Notes (Signed)
Chief Complaint  Patient presents with  . Vaginal Discharge    x 1 week  . STD Testing    HPI 2 NEW PROBLEMS TODAY  Vaginitis: Patient complains of an abnormal vaginal discharge for 1 week. Vaginal symptoms include discharge described as grey and vulvar erythema noted and local irritation.Vulvar symptoms include local irritation.STI Risk: Possible STD exposureDischarge described as: watery.Other associated symptoms: none.Menstrual pattern: She had been bleeding regularly. Contraception: rhythm method     Contraception Counseling: Patient presents for contraception counseling. The patient has no complaints today. The patient is sexually active. Pertinent past medical history: none.  She previously tried depo and gained weight. She tried pills. She reports that she now uses the pull out method and occasionally condoms.    No past medical history on file.  Current Outpatient Prescriptions  Medication Sig Dispense Refill  . Budesonide (UCERIS) 9 MG TB24 Take 9 mg by mouth daily.    Marland Kitchen. etonogestrel-ethinyl estradiol (NUVARING) 0.12-0.015 MG/24HR vaginal ring Insert vaginally and leave in place for 3 consecutive weeks, then remove for 1 week. 1 each 12  . Multiple Vitamin (MULTIVITAMIN WITH MINERALS) TABS tablet Take 1 tablet by mouth once.    . ondansetron (ZOFRAN ODT) 4 MG disintegrating tablet Take 1 tablet (4 mg total) by mouth every 8 (eight) hours as needed for nausea or vomiting. (Patient not taking: Reported on 12/14/2015) 20 tablet 0   No current facility-administered medications for this visit.     Allergies: No Known Allergies  No past surgical history on file.  Social History   Social History  . Marital status: Single    Spouse name: n/a  . Number of children: 0  . 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 Smoking status: Never Smoker  . Smokeless tobacco: Never Used  . Alcohol use No    . Drug use: No  . Sexual activity: Yes    Partners: Male    Birth control/ protection: Condom     Comment: inconsistent condom use   Other Topics Concern  . None   Social History Narrative   In graduate program at [employer] and anticipates a Master's Degree in [place]English and African American Literature 06/2015. Her undergraduate degree in [employer] is from [employer].   Lives with her sister.    ROS  Objective: Vitals:   12/14/15 1708  BP: 122/84  Pulse: 86  Resp: 16  Temp: 98.9 F (37.2 C)  Weight: 209 lb (94.8 kg)  Height: 5' 4.57" (1.64 m)  Patient's last menstrual period was 11/21/2015 (approximate).   Physical Exam Gen: alert and oriented in NAD Eyes: normal conjunctiva , EOM intact Resp: normal effort, no wheezing Skin: no rashes, warm Psych: no anxiety or depression, normal speech pattern and thought content   GU: Vulvar and vaginal erythema noted No CMT Watery gray discharge noted in vagina No vesicles No ulcers  Assessment and Plan Robin Mora was seen today for vaginal discharge and std testing.  Diagnoses and all orders for this visit:  Vaginal discharge- will screen for stds -     WET PREP FOR TRICH, YEAST, CLUE  Screen for STD (sexually transmitted disease)- discussed std screening Advised regular barrier protection -     WET PREP FOR TRICH, YEAST, CLUE -     GC/Chlamydia Probe Amp -     HIV antibody -     RPR  Encounter  for initial prescription of contraceptives, unspecified contraceptive- reviewed various options. Pt interested in a method that does not require daily administration Discussed nuvaring vs. iud She is interested in nuvaring Reviewed risks and benefits  Placement and removal.  -     POCT urine pregnancy -     etonogestrel-ethinyl estradiol (NUVARING) 0.12-0.015 MG/24HR vaginal ring; Insert vaginally and leave in place for 3 consecutive weeks, then remove for 1 week.     Robin Mora

## 2015-12-15 LAB — HIV ANTIBODY (ROUTINE TESTING W REFLEX): HIV: NONREACTIVE

## 2015-12-16 ENCOUNTER — Telehealth: Payer: Self-pay

## 2015-12-16 LAB — GC/CHLAMYDIA PROBE AMP
CT Probe RNA: NOT DETECTED
GC Probe RNA: NOT DETECTED

## 2015-12-16 LAB — RPR

## 2015-12-16 NOTE — Telephone Encounter (Signed)
Pt calling to get lab results from earlier this week. She is anxious to get results due to what tests are done. Please advise when the results are ready!

## 2015-12-17 NOTE — Telephone Encounter (Signed)
Left message for pt to call back  °

## 2015-12-19 NOTE — Telephone Encounter (Signed)
Spoke with pt, advised results. 

## 2015-12-21 LAB — WET PREP BY MOLECULAR PROBE
CANDIDA SPECIES: NEGATIVE
Gardnerella vaginalis: POSITIVE — AB
TRICHOMONAS VAG: NEGATIVE

## 2015-12-22 ENCOUNTER — Telehealth: Payer: Self-pay | Admitting: Family Medicine

## 2015-12-22 MED ORDER — METRONIDAZOLE 500 MG PO TABS: 500.0000 mg | ORAL_TABLET | Freq: Two times a day (BID) | ORAL | 0 refills | Status: DC

## 2015-12-22 NOTE — Telephone Encounter (Signed)
Mychart message sent about patient result showing BV.

## 2016-01-11 ENCOUNTER — Ambulatory Visit (INDEPENDENT_AMBULATORY_CARE_PROVIDER_SITE_OTHER): Payer: BC Managed Care – PPO | Admitting: Family Medicine

## 2016-01-11 ENCOUNTER — Encounter: Payer: Self-pay | Admitting: Family Medicine

## 2016-01-11 VITALS — BP 104/68 | HR 72 | Temp 99.1°F | Resp 18 | Ht 64.57 in | Wt 206.0 lb

## 2016-01-11 DIAGNOSIS — Z202 Contact with and (suspected) exposure to infections with a predominantly sexual mode of transmission: Secondary | ICD-10-CM

## 2016-01-11 MED ORDER — AZITHROMYCIN 500 MG PO TABS: 1000.0000 mg | ORAL_TABLET | Freq: Every day | ORAL | 0 refills | Status: DC

## 2016-01-11 MED ORDER — AZITHROMYCIN 1 G PO PACK: 1.0000 g | PACK | Freq: Once | ORAL | 0 refills | Status: DC

## 2016-01-11 NOTE — Addendum Note (Signed)
Addended by: Bing NeighborsHARRIS, Linnea Todisco S on: 01/11/2016 05:21 PM   Modules accepted: Orders

## 2016-01-11 NOTE — Patient Instructions (Addendum)
Azithromycin 1 gram, take complete dose at once.   Return in 3 months for retesting to ensure infection has cleared.  IF you received an x-ray today, you will receive an invoice from [facility]. Please contact [facility] at [phone] with questions or concerns regarding your invoice.   IF you received labwork today, you will receive an invoice from [facility]. Please contact [facility] at [phone] with questions or concerns regarding your invoice.   Our billing staff will not be able to assist you with questions regarding bills from these companies.  You will be contacted with the lab results as soon as they are available. The fastest way to get your results is to activate your My Chart account. Instructions are located on the last page of this paperwork. If you have not heard from us regarding the results in 2 weeks, please contact this office.     Chlamydia, Female Chlamydia is an infection. It is spread from one person to another person during sexual contact. This infection can be in the cervix, urine tube (urethra), throat, or bottom (rectum). This infection needs treatment. HOME CARE   Take your medicines (antibiotics) as told. Finish them even if you start to feel better.  Only take medicine as told by your doctor.  Tell your sex partner(s) that you have chlamydia. They must also be treated.  Do not have sex until your doctor says it is okay.  Rest.  Eat healthy. Drink enough fluids to keep your pee (urine) clear or pale yellow.  Keep all doctor visits as told. GET HELP IF:  You have pain when you pee.  You have belly pain.  You have vaginal discharge.  You have pain during sex.  You have bleeding between periods and after sex.  You have a fever. GET HELP RIGHT AWAY IF:   You feel sick to your stomach (nauseous) or you throw up (vomit).  You sweat much more than normal (diaphoresis).  You have trouble  swallowing. This information is not intended to replace advice given to you by your health care provider. Make sure you discuss any questions you have with your health care provider. Document Released: 11/01/2007 Document Revised: 05/16/2015 Document Reviewed: 09/29/2012 Elsevier Interactive Patient Education  2017 [employer]

## 2016-01-11 NOTE — Progress Notes (Signed)
Patient ID: Robin Mora, female    DOB: 10/07/1990, 25 y.o.   MRN: [id number]  PCP: No primary care provider on file.  Chief Complaint  Patient presents with  . CHLAMYDIA TESING    BOY FRIEND WAS POSITIVE    Subjective:   HPI 25 year old female presents for screening after STD exposure from sexual partners times 1 day.  [facility] Reports that her boyfriend was tested at the health department for STD and was positive for Chlamydia. Reports that she has recently had unprotected sex with boyfriend, including oral sex. Reports today that she is completely asymptomatic. Refuses STD testing today except Chlamydia as she recently was tested in November.    Social History   Social History  . Marital status: Single    Spouse name: n/a  . Number of children: 0  . 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 Smoking status: Never Smoker  . Smokeless tobacco: Never Used  . Alcohol use No  . Drug use: No  . Sexual activity: Yes    Partners: Male    Birth control/ protection: Condom     Comment: inconsistent condom use   Other Topics Concern  . Not on file   Social History Narrative   In graduate program at [employer] and anticipates a Master's Degree in [place]English and [place] American Literature 06/2015. Her undergraduate degree in [employer] is from [employer].   Lives with her sister.   Family History  Problem Relation Age of Onset  . Hyperlipidemia Father   . Hypertension Father   . Sleep apnea Father   . Diabetes Maternal Grandmother   . Diabetes Paternal Grandmother   . Hypertension Paternal Grandmother   . Heart disease Paternal Grandfather   . Hypertension Paternal Grandfather   . Kidney disease Paternal Grandfather   . Diabetes Paternal Grandfather    Review of Systems See HPI  Patient Active Problem List   Diagnosis Date Noted  . Colitis 06/14/2015  . Large breasts  05/07/2013  . Obesity, unspecified 05/07/2013     Prior to Admission medications   Medication Sig Start Date End Date Taking? Authorizing Provider  Budesonide (UCERIS) 9 MG TB24 Take 9 mg by mouth daily.   Yes Historical Provider, MD  ondansetron (ZOFRAN ODT) 4 MG disintegrating tablet Take 1 tablet (4 mg total) by mouth every 8 (eight) hours as needed for nausea or vomiting. 10/07/15  Yes Shawn Bretta Bang Joy, PA-C  etonogestrel-ethinyl estradiol (NUVARING) 0.12-0.015 MG/24HR vaginal ring Insert vaginally and leave in place for 3 consecutive weeks, then remove for 1 week. Patient not taking: Reported on 01/11/2016 12/14/15   Doristine BosworthZoe A Stallings, MD  No Known Allergies   Objective:  Physical Exam  Constitutional: She is oriented to person, place, and time. She appears well-developed and well-nourished.  HENT:  Head: Normocephalic and atraumatic.  Eyes: Pupils are equal, round, and reactive to light.  Cardiovascular: Normal rate, regular rhythm, normal heart sounds and intact distal pulses.   Pulmonary/Chest: Effort normal and breath sounds normal.  Musculoskeletal: Normal range of motion.  Neurological: She is alert and oriented to person, place, and time.  Skin: Skin is warm and dry.  Psychiatric: She has a normal mood and affect. Her behavior is normal. Judgment and thought content normal.   Vitals:   01/11/16 1605  BP: 104/68  Pulse: 72  Resp:  18  Temp: 99.1 F (37.3 C)     Assessment & Plan:  1. STD exposure to Chlamydia by sexual partner that recently tested positive. Will treat according to CDC guidelines due to exposure Plan: -Azithromycin 1 gram, orally once. -Will follow-up with lab results  Godfrey PickKimberly S. Tiburcio PeaHarris, MSN, FNP-C [facility]

## 2016-01-12 ENCOUNTER — Telehealth: Payer: Self-pay | Admitting: Family Medicine

## 2016-01-12 ENCOUNTER — Telehealth: Payer: Self-pay

## 2016-01-12 NOTE — Telephone Encounter (Signed)
Patient calling because she need a note for work today she didn't go in cause medicine made her nauseous  And she has some zofran and want to know if she can take that Along with what Tiburcio PeaHarris has given her her colitis has flared up

## 2016-01-12 NOTE — Telephone Encounter (Signed)
Faxed note back to pharmacy that order was for 500mg  tabs, 2 tabs day x 1 day.

## 2016-01-12 NOTE — Telephone Encounter (Signed)
Note sent - pt on my chart. Printed and signed -at front desk for pickk up Called pt.  Advised Zofran ok to take for nausea Discussed with Cala BradfordKimberly.

## 2016-01-13 LAB — GC/CHLAMYDIA PROBE AMP
Chlamydia trachomatis, NAA: NEGATIVE
Neisseria gonorrhoeae by PCR: NEGATIVE

## 2016-02-18 ENCOUNTER — Encounter: Payer: Self-pay | Admitting: Family Medicine

## 2016-02-18 ENCOUNTER — Ambulatory Visit (INDEPENDENT_AMBULATORY_CARE_PROVIDER_SITE_OTHER): Payer: BC Managed Care – PPO | Admitting: Family Medicine

## 2016-02-18 VITALS — BP 122/80 | HR 94 | Temp 98.5°F | Resp 16 | Ht 66.0 in | Wt 201.0 lb

## 2016-02-18 DIAGNOSIS — J111 Influenza due to unidentified influenza virus with other respiratory manifestations: Secondary | ICD-10-CM | POA: Diagnosis not present

## 2016-02-18 DIAGNOSIS — M791 Myalgia, unspecified site: Secondary | ICD-10-CM

## 2016-02-18 MED ORDER — CYCLOBENZAPRINE HCL 5 MG PO TABS: 5.0000 mg | ORAL_TABLET | Freq: Three times a day (TID) | ORAL | 0 refills | Status: DC | PRN

## 2016-02-18 NOTE — Progress Notes (Signed)
Chief Complaint  Patient presents with  . Follow-up    Ob-GYN told pt she had the flu.   . Generalized Body Aches    X Monday  . Cough    X Monday    HPI   Flu and Myalgia Pt reports that she was at her Gyne and was diagnosed with the flu She reports that she had trichomonas and was being treated She was treated for BV at that visit She reports that she had cough, body aches, fever and chills.  She reports that she is on Tamiflu. She reports that she is feeling better and has more energy. She is coughing and sneezing which causes her lower back to hurt.  She states that she is    Past Medical History:  Diagnosis Date  . Ulcerative colitis (HCC)     Current Outpatient Prescriptions  Medication Sig Dispense Refill  . metroNIDAZOLE (FLAGYL) 500 MG tablet Take 500 mg by mouth 3 (three) times daily.    Marland Kitchen. oseltamivir (TAMIFLU) 30 MG capsule Take 30 mg by mouth.    . Budesonide (UCERIS) 9 MG TB24 Take 9 mg by mouth daily.    . cyclobenzaprine (FLEXERIL) 5 MG tablet Take 1 tablet (5 mg total) by mouth 3 (three) times daily as needed for muscle spasms. 30 tablet 0   No current facility-administered medications for this visit.     Allergies: No Known Allergies  No past surgical history on file.  Social History   Social History  . Marital status: Single    Spouse name: n/a  . Number of children: 0  . 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 Smoking status: Never Smoker  . Smokeless tobacco: Never Used  . Alcohol use No  . Drug use: No  . Sexual activity: Yes    Partners: Male    Birth control/ protection: Condom     Comment: inconsistent condom use   Other Topics Concern  . None   Social History Narrative   In graduate program at [employer] and anticipates a Master's Degree in [place]English and African American Literature 06/2015. Her undergraduate degree in [employer] is from [employer].     Lives with her sister.    ROS  Objective: Vitals:   02/18/16 1104  BP: 122/80  Pulse: 94  Resp: 16  Temp: 98.5 F (36.9 C)  TempSrc: Oral  SpO2: 98%  Weight: 201 lb (91.2 kg)  Height: 5\' 6"  (1.676 m)    Physical Exam General: alert, oriented, in NAD Head: normocephalic, atraumatic, no sinus tenderness Eyes: EOM intact, no scleral icterus or conjunctival injection Ears: TM clear bilaterally Throat: no pharyngeal exudate or erythema Lymph: no posterior auricular, submental or cervical lymph adenopathy Heart: normal rate, normal sinus rhythm, no murmurs Lungs: clear to auscultation bilaterally, no wheezing Back exam: tender to palpation in the sacral region, no deformity or laceration  Assessment and Plan Robin Mora was seen today for follow-up, generalized body aches and cough.  Diagnoses and all orders for this visit:  Flu syndrome- supportive care  Myalgia- discussed that she should take an antihistamine to cut down on her sneezing Muscle relaxer for back tightness and spasms  Other orders -     cyclobenzaprine (FLEXERIL) 5 MG tablet; Take 1 tablet (5 mg total) by mouth 3 (three) times daily as needed for muscle spasms.     Elizette Shek A Neira Bentsen

## 2016-02-18 NOTE — Patient Instructions (Addendum)
IF you received an x-ray today, you will receive an invoice from [facility]. Please contact [facility] at [phone] with questions or concerns regarding your invoice.   IF you received labwork today, you will receive an invoice from [facility]. Please contact [facility] at [phone] with questions or concerns regarding your invoice.   Our billing staff will not be able to assist you with questions regarding bills from these companies.  You will be contacted with the lab results as soon as they are available. The fastest way to get your results is to activate your My Chart account. Instructions are located on the last page of this paperwork. If you have not heard from us regarding the results in 2 weeks, please contact this office.      Muscle Pain, Adult Muscle pain (myalgia) may be mild or severe. In most cases, the pain lasts only a short time and it goes away without treatment. It is normal to feel some muscle pain after starting a workout program. Muscles that have not been used often will be sore at first. Muscle pain may also be caused by many other things, including:  Overuse or muscle strain, especially if you are not in shape. This is the most common cause of muscle pain.  Injury.  Bruises.  Viruses, such as the flu.  Infectious diseases.  A chronic condition that causes muscle tenderness, fatigue, and headache (fibromyalgia).  A condition, such as lupus, in which the body's disease-fighting system attacks other organs in the body (autoimmune or rheumatologic diseases).  Certain drugs, including ACE inhibitors and statins. To diagnose the cause of your muscle pain, your health care provider will do a physical exam and ask questions about the pain and when it began. If you have not had muscle pain for very long, your health care provider may want to wait before doing much testing. If your muscle pain has lasted a long time, your health care provider  may want to run tests right away. In some cases, this may include tests to rule out certain conditions or illnesses. Treatment for muscle pain depends on the cause. Home care is often enough to relieve muscle pain. Your health care provider may also prescribe anti-inflammatory medicine. Follow these instructions at home: Activity  If overuse is causing your muscle pain:  Slow down your activities until the pain goes away.  Do regular, gentle exercises if you are not usually active.  Warm up before exercising. Stretch before and after exercising. This can help lower the risk of muscle pain.  Do not continue working out if the pain is very bad. Bad pain could mean that you have injured a muscle. Managing pain and discomfort  If directed, apply ice to the sore muscle:  Put ice in a plastic bag.  Place a towel between your skin and the bag.  Leave the ice on for 20 minutes, 2-3 times a day.  You may also alternate between applying ice and applying heat as told by your health care provider. To apply heat, use the heat source that your health care provider recommends, such as a moist heat pack or a heating pad.  Place a towel between your skin and the heat source.  Leave the heat on for 20-30 minutes.  Remove the heat if your skin turns bright red. This is especially important if you are unable to feel pain, heat, or cold. You may have a greater risk of getting burned. Medicines  Take over-the-counter  and prescription medicines only as told by your health care provider.  Do not drive or use heavy machinery while taking prescription pain medicine. Contact a health care provider if:  Your muscle pain gets worse and medicines do not help.  You have muscle pain that lasts longer than 3 days.  You have a rash or fever along with muscle pain.  You have muscle pain after a tick bite.  You have muscle pain while working out, even though you are in good physical condition.  You have  redness, soreness, or swelling along with muscle pain.  You have muscle pain after starting a new medicine or changing the dose of a medicine. Get help right away if:  You have trouble breathing.  You have trouble swallowing.  You have muscle pain along with a stiff neck, fever, and vomiting.  You have severe muscle weakness or cannot move part of your body. This information is not intended to replace advice given to you by your health care provider. Make sure you discuss any questions you have with your health care provider. Document Released: 12/14/2005 Document Revised: 08/12/2015 Document Reviewed: 06/14/2015 Elsevier Interactive Patient Education  2017 [employer].

## 2016-03-20 ENCOUNTER — Encounter: Payer: Self-pay | Admitting: Family Medicine

## 2016-04-20 ENCOUNTER — Ambulatory Visit (INDEPENDENT_AMBULATORY_CARE_PROVIDER_SITE_OTHER): Payer: BC Managed Care – PPO | Admitting: Family Medicine

## 2016-04-20 VITALS — BP 118/76 | HR 77 | Temp 98.0°F | Resp 16 | Ht 66.0 in | Wt 200.2 lb

## 2016-04-20 DIAGNOSIS — M791 Myalgia, unspecified site: Secondary | ICD-10-CM

## 2016-04-20 DIAGNOSIS — L219 Seborrheic dermatitis, unspecified: Secondary | ICD-10-CM | POA: Diagnosis not present

## 2016-04-20 MED ORDER — HYDROCORTISONE 2.5 % EX CREA: TOPICAL_CREAM | Freq: Two times a day (BID) | CUTANEOUS | 0 refills | Status: DC

## 2016-04-20 MED ORDER — CYCLOBENZAPRINE HCL 5 MG PO TABS: 5.0000 mg | ORAL_TABLET | Freq: Three times a day (TID) | ORAL | 1 refills | Status: DC | PRN

## 2016-04-20 MED ORDER — KETOCONAZOLE 2 % EX SHAM: 1.0000 "application " | MEDICATED_SHAMPOO | CUTANEOUS | 0 refills | Status: DC

## 2016-04-20 MED ORDER — CETIRIZINE HCL 10 MG PO TABS: 10.0000 mg | ORAL_TABLET | Freq: Every day | ORAL | 11 refills | Status: DC

## 2016-04-20 NOTE — Patient Instructions (Addendum)
     IF you received an x-ray today, you will receive an invoice from [facility]. Please contact [facility] at [phone] with questions or concerns regarding your invoice.   IF you received labwork today, you will receive an invoice from [facility]. Please contact [facility] at [phone] with questions or concerns regarding your invoice.   Our billing staff will not be able to assist you with questions regarding bills from these companies.  You will be contacted with the lab results as soon as they are available. The fastest way to get your results is to activate your My Chart account. Instructions are located on the last page of this paperwork. If you have not heard from us regarding the results in 2 weeks, please contact this office.       Seborrheic Dermatitis, Adult Seborrheic dermatitis is a skin disease that causes red, scaly patches. It usually occurs on the scalp, and it is often called dandruff. The patches may appear on other parts of the body. Skin patches tend to appear where there are many oil glands in the skin. Areas of the body that are commonly affected include:  Scalp.  Skin folds of the body.  Ears.  Eyebrows.  Neck.  Face.  Armpits.  The bearded area of men's faces. The condition may come and go for no known reason, and it is often long-lasting (chronic). What are the causes? The cause of this condition is not known. What increases the risk? This condition is more likely to develop in people who:  Have certain conditions, such as:  HIV (human immunodeficiency virus).  AIDS (acquired immunodeficiency syndrome).  Parkinson disease.  Mood disorders, such as depression.  Are 3540-26 years old. What are the signs or symptoms? Symptoms of this condition include:  Thick scales on the scalp.  Redness on the face or in the armpits.  Skin that is flaky. The flakes may be white or yellow.  Skin that seems oily or dry but is not  helped with moisturizers.  Itching or burning in the affected areas. How is this diagnosed? This condition is diagnosed with a medical history and physical exam. A sample of your skin may be tested (skin biopsy). You may need to see a skin specialist (dermatologist). How is this treated? There is no cure for this condition, but treatment can help to manage the symptoms. You may get treatment to remove scales, lower the risk of skin infection, and reduce swelling or itching. Treatment may include:  Creams that reduce swelling and irritation (steroids).  Creams that reduce skin yeast.  Medicated shampoo, soaps, moisturizing creams, or ointments.  Medicated moisturizing creams or ointments. Follow these instructions at home:  Apply over-the-counter and prescription medicines only as told by your health care provider.  Use any medicated shampoo, soaps, skin creams, or ointments only as told by your health care provider.  Keep all follow-up visits as told by your health care provider. This is important. Contact a health care provider if:  Your symptoms do not improve with treatment.  Your symptoms get worse.  You have new symptoms. This information is not intended to replace advice given to you by your health care provider. Make sure you discuss any questions you have with your health care provider. Document Released: 01/22/2005 Document Revised: 08/12/2015 Document Reviewed: 05/12/2015 Elsevier Interactive Patient Education  2017 [employer]

## 2016-04-20 NOTE — Progress Notes (Signed)
Chief Complaint  Patient presents with  . Rash    back of neck/scalp and covering her back for a while    HPI   Pt reports that she was taking flexeril for her back pains She plans to make an appointment for her low back She states that her pain is radiating from her low back to her leg She reports flexeril helps  Skin Rash She reports a rash on her scalp and back for 6 months She had braided hair do and reports that she has recently removed them and noted that she has been having thick, oily scaly scalp lesions and small bumps that are not painful but have yellowish color to them.  She reports that she also has rash on her back and chest She used hydrocortisone 1% on her chest and the lesions have improved She bathes with liquid bath products She uses shae butter  Past Medical History:  Diagnosis Date  . Ulcerative colitis (HCC)     Current Outpatient Prescriptions  Medication Sig Dispense Refill  . cyclobenzaprine (FLEXERIL) 5 MG tablet Take 1 tablet (5 mg total) by mouth 3 (three) times daily as needed for muscle spasms. 30 tablet 1  . metroNIDAZOLE (FLAGYL) 500 MG tablet Take 500 mg by mouth 3 (three) times daily.    . Budesonide (UCERIS) 9 MG TB24 Take 9 mg by mouth daily.    . cetirizine (ZYRTEC ALLERGY) 10 MG tablet Take 1 tablet (10 mg total) by mouth daily. 30 tablet 11  . hydrocortisone 2.5 % cream Apply topically 2 (two) times daily. For 4-6 weeks. 30 g 0  . [START ON 04/23/2016] ketoconazole (NIZORAL) 2 % shampoo Apply 1 application topically 2 (two) times a week. 120 mL 0  . oseltamivir (TAMIFLU) 30 MG capsule Take 30 mg by mouth.     No current facility-administered medications for this visit.     Allergies: No Known Allergies  No past surgical history on file.  Social History   Social History  . Marital status: Single    Spouse name: n/a  . Number of children: 0  . Years of education: college   Occupational History  . Teacher     Hydrographic surveyor   Social History Main Topics  . Smoking status: Never Smoker  . Smokeless tobacco: Never Used  . Alcohol use No  . Drug use: No  . Sexual activity: Yes    Partners: Male    Birth control/ protection: Condom     Comment: inconsistent condom use   Other Topics Concern  . None   Social History Narrative   In graduate program at [employer] and anticipates a Master's Degree in [place] and African American Literature 06/2015. Her undergraduate degree in [employer] is from [employer].   Lives with her sister.    Review of Systems  Constitutional: Negative for chills and fever.  Respiratory: Negative for cough, shortness of breath and wheezing.   Cardiovascular: Negative for chest pain and palpitations.  Gastrointestinal: Negative for abdominal pain, nausea and vomiting.  Genitourinary: Negative for dysuria, frequency and urgency.  Musculoskeletal: Positive for back pain and myalgias. Negative for neck pain.  Skin: Positive for itching and rash.     Objective: Vitals:   04/20/16 1551  BP: 118/76  Pulse: 77  Resp: 16  Temp: 98 F (36.7 C)  TempSrc: Oral  SpO2: 97%  Weight: 200 lb 3.2 oz (90.8 kg)  Height: 5\' 6"  (1.676 m)    Physical Exam  Constitutional: She is oriented to person, place, and time. She appears well-developed and well-nourished.  HENT:  Head: Normocephalic and atraumatic.  Eyes: Conjunctivae and EOM are normal. Right eye exhibits no discharge. Left eye exhibits no discharge.  Pulmonary/Chest: Effort normal.  Neurological: She is alert and oriented to person, place, and time.  Skin: Capillary refill takes less than 2 seconds. No erythema.  Scaly lesion with rough hyperpigmentation on back diffusely, on scalp vesicular lesion in hairline at the nape of the neck. In the middle of the head dry scaly and oily skin changes. No alopecia  Psychiatric: She has a normal mood and affect. Her behavior is normal. Judgment and thought content normal.   Skin-  scaly  Assessment and Plan Robin Mora was seen today for rash.  Diagnoses and all orders for this visit:  Acute seborrheic dermatitisp- discussed that she should use nizoral shampoo After completing nizoral she should use hydrocortisone She should take zyrtec to prevent itching -     hydrocortisone 2.5 % cream; Apply topically 2 (two) times daily. For 4-6 weeks. -     cetirizine (ZYRTEC ALLERGY) 10 MG tablet; Take 1 tablet (10 mg total) by mouth daily. -     ketoconazole (NIZORAL) 2 % shampoo; Apply 1 application topically 2 (two) times a week.  Myalgia- she should return to full musculoskeletal exam and also to do stretches at home Discussed that at the next visit we may need imaging however imaging usually do not show the answer Weight loss advised to help back pain -     cyclobenzaprine (FLEXERIL) 5 MG tablet; Take 1 tablet (5 mg total) by mouth 3 (three) times daily as needed for muscle spasms.       Robin Mora A Robin Mora

## 2016-05-01 ENCOUNTER — Emergency Department (HOSPITAL_COMMUNITY)
Admission: EM | Admit: 2016-05-01 | Discharge: 2016-05-01 | Disposition: A | Discharge status: Home / Self Care | Payer: BC Managed Care – PPO | Attending: Emergency Medicine | Admitting: Emergency Medicine

## 2016-05-01 ENCOUNTER — Encounter (HOSPITAL_COMMUNITY): Payer: Self-pay | Admitting: Emergency Medicine

## 2016-05-01 DIAGNOSIS — M5441 Lumbago with sciatica, right side: Secondary | ICD-10-CM | POA: Insufficient documentation

## 2016-05-01 DIAGNOSIS — M545 Low back pain: Secondary | ICD-10-CM | POA: Diagnosis present

## 2016-05-01 DIAGNOSIS — M5416 Radiculopathy, lumbar region: Secondary | ICD-10-CM | POA: Insufficient documentation

## 2016-05-01 DIAGNOSIS — G8929 Other chronic pain: Secondary | ICD-10-CM

## 2016-05-01 DIAGNOSIS — Z79899 Other long term (current) drug therapy: Secondary | ICD-10-CM | POA: Insufficient documentation

## 2016-05-01 MED ORDER — OXYCODONE-ACETAMINOPHEN 5-325 MG PO TABS
1.0000 | ORAL_TABLET | Freq: Once | ORAL | Status: AC
  Administered 2016-05-01: 1 via ORAL
  Filled 2016-05-01: qty 1

## 2016-05-01 MED ORDER — PREDNISONE 20 MG PO TABS
60.0000 mg | ORAL_TABLET | Freq: Once | ORAL | Status: AC
  Administered 2016-05-01: 60 mg via ORAL
  Filled 2016-05-01: qty 3

## 2016-05-01 MED ORDER — PREDNISONE 10 MG PO TABS: ORAL_TABLET | ORAL | 0 refills | Status: DC
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
## 2016-05-01 NOTE — ED Triage Notes (Signed)
Pt reports having lower back pain with radiation into right leg that began appx 2 weeks ago. Pt states pain feels like her leg is going to sleep and her PCP had prescribed muscle relaxer for back pain with no relief.

## 2016-05-02 ENCOUNTER — Encounter: Payer: Self-pay | Admitting: Family Medicine

## 2016-05-02 ENCOUNTER — Ambulatory Visit (INDEPENDENT_AMBULATORY_CARE_PROVIDER_SITE_OTHER): Payer: BC Managed Care – PPO | Admitting: Family Medicine

## 2016-05-02 VITALS — BP 100/67 | HR 94 | Temp 98.1°F | Resp 16 | Ht 64.0 in | Wt 197.0 lb

## 2016-05-02 DIAGNOSIS — M5441 Lumbago with sciatica, right side: Secondary | ICD-10-CM | POA: Diagnosis not present

## 2016-05-02 NOTE — Patient Instructions (Addendum)
Instructions for Prednisone Taper take 5 tablets on day 2 take 4 tablets on day 3  take 3 tablets on day 4  take 2 tablets on day 5 take 1 tablet on day 6  IF you received an x-ray today, you will receive an invoice from [facility]. Please contact [facility] at [phone] with questions or concerns regarding your invoice.   IF you received labwork today, you will receive an invoice from [facility]. Please contact [facility] at [phone] with questions or concerns regarding your invoice.   Our billing staff will not be able to assist you with questions regarding bills from these companies.  You will be contacted with the lab results as soon as they are available. The fastest way to get your results is to activate your My Chart account. Instructions are located on the last page of this paperwork. If you have not heard from [place] regarding the results in 2 weeks, please contact this office.      Sciatica Sciatica is pain, numbness, weakness, or tingling along your sciatic nerve. The sciatic nerve starts in the lower back and goes down the back of each leg. Sciatica happens when this nerve is pinched or has pressure put on it. Sciatica usually goes away on its own or with treatment. Sometimes, sciatica may keep coming back (recur). Follow these instructions at home: Medicines   Take over-the-counter and prescription medicines only as told by your doctor.  Do not drive or use heavy machinery while taking prescription pain medicine. Managing pain   If directed, put ice on the affected area.  Put ice in a plastic bag.  Place a towel between your skin and the bag.  Leave the ice on for 20 minutes, 2-3 times a day.  After icing, apply heat to the affected area before you exercise or as often as told by your doctor. Use the heat source that your doctor tells you to use, such as a moist heat pack or a heating pad.  Place a towel between your skin and the heat  source.  Leave the heat on for 20-30 minutes.  Remove the heat if your skin turns bright red. This is especially important if you are unable to feel pain, heat, or cold. You may have a greater risk of getting burned. Activity   Return to your normal activities as told by your doctor. Ask your doctor what activities are safe for you.  Avoid activities that make your sciatica worse.  Take short rests during the day. Rest in a lying or standing position. This is usually better than sitting to rest.  When you rest for a long time, do some physical activity or stretching between periods of rest.  Avoid sitting for a long time without moving. Get up and move around at least one time each hour.  Exercise and stretch regularly, as told by your doctor.  Do not lift anything that is heavier than 10 lb (4.5 kg) while you have symptoms of sciatica.  Avoid lifting heavy things even when you do not have symptoms.  Avoid lifting heavy things over and over.  When you lift objects, always lift in a way that is safe for your body. To do this, you should:  Bend your knees.  Keep the object close to your body.  Avoid twisting. General instructions   Use good posture.  Avoid leaning forward when you are sitting.  Avoid hunching over when you are standing.  Stay at a healthy weight.  Wear comfortable shoes that support your feet. Avoid wearing high heels.  Avoid sleeping on a mattress that is too soft or too hard. You might have less pain if you sleep on a mattress that is firm enough to support your back.  Keep all follow-up visits as told by your doctor. This is important. Contact a doctor if:  You have pain that:  Wakes you up when you are sleeping.  Gets worse when you lie down.  Is worse than the pain you have had in the past.  Lasts longer than 4 weeks.  You lose weight for without trying. Get help right away if:  You cannot control when you pee (urinate) or poop (have a  bowel movement).  You have weakness in any of these areas and it gets worse.  Lower back.  Lower belly (pelvis).  Butt (buttocks).  Legs.  You have redness or swelling of your back.  You have a burning feeling when you pee. This information is not intended to replace advice given to you by your health care provider. Make sure you discuss any questions you have with your health care provider. Document Released: 11/01/2007 Document Revised: 06/30/2015 Document Reviewed: 10/01/2014 Elsevier Interactive Patient Education  2017 [employer]

## 2016-05-02 NOTE — Progress Notes (Signed)
Chief Complaint  Patient presents with  . Back Pain    HPI   She reports that she was given a percocet and 3 steroid She was prescribed prednisone and a muscle relaxer She was diagnosed with Sciatica Right  - she has thigh numbness No limping    Past Medical History:  Diagnosis Date  . Ulcerative colitis (HCC)     Current Outpatient Prescriptions  Medication Sig Dispense Refill  . Budesonide (UCERIS) 9 MG TB24 Take 9 mg by mouth daily.    . cetirizine (ZYRTEC ALLERGY) 10 MG tablet Take 1 tablet (10 mg total) by mouth daily. 30 tablet 11  . cyclobenzaprine (FLEXERIL) 5 MG tablet Take 1 tablet (5 mg total) by mouth 3 (three) times daily as needed for muscle spasms. 30 tablet 1  . hydrocortisone 2.5 % cream Apply topically 2 (two) times daily. For 4-6 weeks. 30 g 0  . ketoconazole (NIZORAL) 2 % shampoo Apply 1 application topically 2 (two) times a week. 120 mL 0  . metroNIDAZOLE (FLAGYL) 500 MG tablet Take 500 mg by mouth 3 (three) times daily.    . predniSONE (DELTASONE) 10 MG tablet Take 10 mg by mouth daily with breakfast. Take 5 on day 2, take 4 on day 3, take 3 on day 4, take 2 on day 5, take 1 on day 6    . oseltamivir (TAMIFLU) 30 MG capsule Take 30 mg by mouth.     No current facility-administered medications for this visit.     Allergies: No Known Allergies  History reviewed. No pertinent surgical history.  Social History   Social History  . Marital status: Single    Spouse name: n/a  . Number of children: 0  . Years of education: college   Occupational History  . Teacher     Administrator   Social History Main Topics  . Smoking status: Never Smoker  . Smokeless tobacco: Never Used  . Alcohol use No  . Drug use: No  . Sexual activity: Yes    Partners: Male    Birth control/ protection: Condom     Comment: inconsistent condom use   Other Topics Concern  . None   Social History Narrative   In graduate program at [employer] and anticipates a  Master's Degree in [place] and African American Literature 06/2015. Her undergraduate degree in [employer] is from [employer].   Lives with her sister.    ROS  Objective: Vitals:   05/02/16 1505  BP: 100/67  Pulse: 94  Resp: 16  Temp: 98.1 F (36.7 C)  TempSrc: Oral  SpO2: 100%  Weight: 197 lb (89.4 kg)  Height: 5\' 4"  (1.626 m)    Physical Exam  Constitutional: She is oriented to person, place, and time. She appears well-developed and well-nourished.  HENT:  Head: Normocephalic and atraumatic.  Right Ear: External ear normal.  Cardiovascular: Normal rate, regular rhythm and normal heart sounds.   Pulmonary/Chest: Effort normal and breath sounds normal. No respiratory distress. She has no wheezes. She has no rales.  Musculoskeletal:  Straight leg raise positive at 40 degrees  Neurological: She is alert and oriented to person, place, and time.  Skin: Skin is warm. Capillary refill takes less than 2 seconds.  Psychiatric: She has a normal mood and affect. Her behavior is normal. Judgment and thought content normal.    Assessment and Plan Randye was seen today for back pain.  Diagnoses and all orders for this visit:  Acute right-sided low  back pain with right-sided sciatica-  Advised to fill prednisone prescription Follow up with PT Weight loss advised -     Ambulatory referral to Physical Therapy     Lilyan Prete A Creta LevinStallings

## 2016-06-18 ENCOUNTER — Other Ambulatory Visit: Payer: Self-pay | Admitting: Family Medicine

## 2016-06-29 ENCOUNTER — Inpatient Hospital Stay (HOSPITAL_COMMUNITY)
Admission: EM | Admit: 2016-06-29 | Discharge: 2016-07-02 | DRG: 387 | Disposition: A | Discharge status: Home / Self Care | Payer: BC Managed Care – PPO | Attending: Internal Medicine | Admitting: Internal Medicine

## 2016-06-29 ENCOUNTER — Encounter (HOSPITAL_COMMUNITY): Payer: Self-pay

## 2016-06-29 DIAGNOSIS — K51911 Ulcerative colitis, unspecified with rectal bleeding: Secondary | ICD-10-CM | POA: Diagnosis present

## 2016-06-29 DIAGNOSIS — M549 Dorsalgia, unspecified: Secondary | ICD-10-CM | POA: Diagnosis present

## 2016-06-29 DIAGNOSIS — K625 Hemorrhage of anus and rectum: Secondary | ICD-10-CM

## 2016-06-29 DIAGNOSIS — G8929 Other chronic pain: Secondary | ICD-10-CM | POA: Diagnosis present

## 2016-06-29 DIAGNOSIS — E669 Obesity, unspecified: Secondary | ICD-10-CM | POA: Diagnosis present

## 2016-06-29 DIAGNOSIS — M545 Low back pain: Secondary | ICD-10-CM

## 2016-06-29 DIAGNOSIS — M79604 Pain in right leg: Secondary | ICD-10-CM

## 2016-06-29 DIAGNOSIS — E876 Hypokalemia: Secondary | ICD-10-CM | POA: Diagnosis present

## 2016-06-29 DIAGNOSIS — D649 Anemia, unspecified: Secondary | ICD-10-CM | POA: Diagnosis present

## 2016-06-29 DIAGNOSIS — Z7952 Long term (current) use of systemic steroids: Secondary | ICD-10-CM

## 2016-06-29 DIAGNOSIS — M25559 Pain in unspecified hip: Secondary | ICD-10-CM

## 2016-06-29 DIAGNOSIS — D509 Iron deficiency anemia, unspecified: Secondary | ICD-10-CM | POA: Diagnosis present

## 2016-06-29 DIAGNOSIS — R112 Nausea with vomiting, unspecified: Secondary | ICD-10-CM | POA: Diagnosis present

## 2016-06-29 DIAGNOSIS — M25551 Pain in right hip: Secondary | ICD-10-CM | POA: Diagnosis present

## 2016-06-29 DIAGNOSIS — K5190 Ulcerative colitis, unspecified, without complications: Secondary | ICD-10-CM | POA: Diagnosis present

## 2016-06-29 DIAGNOSIS — K219 Gastro-esophageal reflux disease without esophagitis: Secondary | ICD-10-CM | POA: Diagnosis present

## 2016-06-29 DIAGNOSIS — Z6832 Body mass index (BMI) 32.0-32.9, adult: Secondary | ICD-10-CM

## 2016-06-29 DIAGNOSIS — E860 Dehydration: Secondary | ICD-10-CM | POA: Diagnosis present

## 2016-06-29 DIAGNOSIS — Z79899 Other long term (current) drug therapy: Secondary | ICD-10-CM

## 2016-06-29 LAB — COMPREHENSIVE METABOLIC PANEL
ALT: 12 U/L — AB (ref 14–54)
AST: 17 U/L (ref 15–41)
Albumin: 3.8 g/dL (ref 3.5–5.0)
Alkaline Phosphatase: 60 U/L (ref 38–126)
Anion gap: 8 (ref 5–15)
BUN: 9 mg/dL (ref 6–20)
CHLORIDE: 106 mmol/L (ref 101–111)
CO2: 26 mmol/L (ref 22–32)
CREATININE: 0.78 mg/dL (ref 0.44–1.00)
Calcium: 8.7 mg/dL — ABNORMAL LOW (ref 8.9–10.3)
Glucose, Bld: 96 mg/dL (ref 65–99)
Potassium: 3.1 mmol/L — ABNORMAL LOW (ref 3.5–5.1)
Sodium: 140 mmol/L (ref 135–145)
Total Bilirubin: 0.4 mg/dL (ref 0.3–1.2)
Total Protein: 8.1 g/dL (ref 6.5–8.1)

## 2016-06-29 LAB — URINALYSIS, ROUTINE W REFLEX MICROSCOPIC
BILIRUBIN URINE: NEGATIVE
GLUCOSE, UA: NEGATIVE mg/dL
KETONES UR: 20 mg/dL — AB
NITRITE: NEGATIVE
PH: 5 (ref 5.0–8.0)
Protein, ur: NEGATIVE mg/dL
Specific Gravity, Urine: 1.023 (ref 1.005–1.030)

## 2016-06-29 LAB — CBC
HCT: 35.5 % — ABNORMAL LOW (ref 36.0–46.0)
Hemoglobin: 11.3 g/dL — ABNORMAL LOW (ref 12.0–15.0)
MCH: 25.9 pg — ABNORMAL LOW (ref 26.0–34.0)
MCHC: 31.8 g/dL (ref 30.0–36.0)
MCV: 81.2 fL (ref 78.0–100.0)
PLATELETS: 474 10*3/uL — AB (ref 150–400)
RBC: 4.37 MIL/uL (ref 3.87–5.11)
RDW: 15.4 % (ref 11.5–15.5)
WBC: 6.5 10*3/uL (ref 4.0–10.5)

## 2016-06-29 LAB — I-STAT BETA HCG BLOOD, ED (MC, WL, AP ONLY): I-stat hCG, quantitative: <5 m[IU]/mL (ref <5)

## 2016-06-29 LAB — LIPASE, BLOOD: LIPASE: 33 U/L (ref 11–51)

## 2016-06-29 MED ORDER — SODIUM CHLORIDE 0.9 % IV BOLUS (SEPSIS)
1000.0000 mL | Freq: Once | INTRAVENOUS | Status: AC
  Administered 2016-06-29: 1000 mL via INTRAVENOUS

## 2016-06-29 MED ORDER — POTASSIUM CHLORIDE CRYS ER 20 MEQ PO TBCR
40.0000 meq | EXTENDED_RELEASE_TABLET | Freq: Once | ORAL | Status: AC
  Administered 2016-06-29: 40 meq via ORAL
  Filled 2016-06-29: qty 2

## 2016-06-29 MED ORDER — METRONIDAZOLE IN NACL 5-0.79 MG/ML-% IV SOLN
500.0000 mg | Freq: Once | INTRAVENOUS | Status: AC
  Administered 2016-06-29: 500 mg via INTRAVENOUS
  Filled 2016-06-29: qty 100

## 2016-06-29 MED ORDER — CIPROFLOXACIN IN D5W 400 MG/200ML IV SOLN
400.0000 mg | Freq: Once | INTRAVENOUS | Status: AC
  Administered 2016-06-30: 400 mg via INTRAVENOUS
  Filled 2016-06-29: qty 200

## 2016-06-29 NOTE — ED Notes (Signed)
Bed: WA08 Expected date:  Expected time:  Means of arrival:  Comments: 

## 2016-06-29 NOTE — ED Triage Notes (Signed)
Pt reports hx of UC and states that she has had 10-15 bloody stools a day for a week. She reports compliance with her medication without relief. She also reports that her vagina is burning and her R leg feels numb (x1 month). A&Ox4. Ambulatory.

## 2016-06-29 NOTE — ED Provider Notes (Signed)
[facility]-EMERGENCY DEPT Provider Note   CSN: [id number] Arrival date & time: 06/29/16  2113   By signing my name below, I, Clarisse Gouge, attest that this documentation has been prepared under the direction and in the presence of Devoria Albe, MD. Electronically signed, Clarisse Gouge, ED Scribe. 06/30/16. 12:09 AM.  Time seen 23:10 PM   History   Chief Complaint Chief Complaint  Patient presents with  . Blood In Stools  . Diarrhea  . Numbness   The history is provided by the patient and medical records. No language interpreter was used.    Robin Mora is a 26 y.o. female with h/o ulcerative colitis and hemorrhoids who presents to the Emergency Department with concern for acute on chronic bloody diarrhea x 1 week. 10-15 episodes noted daily. N/V once daily, weakness and hot flashes noted. Episodic, moderate, sharp, periumbilical pain also noted when the pt feels the sensation of need to relieve bowels. She reports this is relieved after bowel movements. States it only lasts a few minutes.  Pt states this is the worst flare up of ulcerative colitis that she has experienced to date. She notes frequent, worsening flare ups recently. She states fried and spicy foods, carbonated beverages and processed foods cause flare ups historically. She states she has avoided these foods and attempted dietary changes without relief. Pt notes diarrhea after every meal since onset of presneting symptoms. Pt followed by Dr. Noe Gens of [facility] for ulcerative colitis, with which she states she was diagnosed on 04/21/2016. Endoscopy noted ~10/2015 that revealed acid in the esophagus; she states she has not seen her gastrologist for F/U since this evaluation. Pt prescribed medication for ulcerative colitis by her gastrologist and she states the medication provides mild relief to her symptoms. She states she was advised to take her medication TID, but she reduced her dosage to BID 1 month ago because she is running out. Pt  notes she has attempted to schedule an appointment with her gastrologist, but she has not been able to contact the office. Occasional ETOH use noted; pt states she has not imbibed recently. No tobacco use noted. No h/o hospitalizations noted related to ulcerative colitis.. No numbness or dizziness. No other complaints at this time.  She states she was tried on Liada but that made her feel worse, so she has been on Budesonide (uceris) and about a month ago she had to cut her dose back from 3 times a day to twice a day because she was running out of pills and refills. She states she called her GI office on the 21st however she's not heard back from them.  PCP is Dr. Pennie Rushing, who is an OB/GYN GI Dr Noe Gens at [facility] in [facility]  Past Medical History:  Diagnosis Date  . Ulcerative colitis [facility])     Patient Active Problem List   Diagnosis Date Noted  . Colitis 06/14/2015  . Large breasts 05/07/2013  . Obesity, unspecified 05/07/2013    History reviewed. No pertinent surgical history.  OB History    No data available       Home Medications    Prior to Admission medications   Medication Sig Start Date End Date Taking? Authorizing Provider  balsalazide (COLAZAL) 750 MG capsule Take 2,250 mg by mouth 3 (three) times daily.   Yes [provider]  cetirizine (ZYRTEC ALLERGY) 10 MG tablet Take 1 tablet (10 mg total) by mouth daily. 04/20/16  Yes Doristine Bosworth, MD  cyclobenzaprine (FLEXERIL) 5  MG tablet Take 1 tablet (5 mg total) by mouth 3 (three) times daily as needed for muscle spasms. 04/20/16  Yes Stallings, Zoe A, MD  hydrocortisone 2.5 % cream APPLY TOPICALLY TWICE DAILY FOR 4-6 WEEKS Patient taking differently: Apply 1 application twice daily 06/27/16  Yes Stallings, Zoe A, MD  ketoconazole (NIZORAL) 2 % shampoo APPLY 1 APPLICATION TOPICALLY 2 (TWO) TIMES A WEEK Patient not taking: Reported on 06/29/2016 06/28/16   Doristine Bosworth, MD    Family History Family  History  Problem Relation Age of Onset  . Hyperlipidemia Father   . Hypertension Father   . Sleep apnea Father   . Diabetes Maternal Grandmother   . Diabetes Paternal Grandmother   . Hypertension Paternal Grandmother   . Heart disease Paternal Grandfather   . Hypertension Paternal Grandfather   . Kidney disease Paternal Grandfather   . Diabetes Paternal Grandfather     Social History Social History  Substance Use Topics  . Smoking status: Never Smoker  . Smokeless tobacco: Never Used  . Alcohol use No  employed   Allergies   Patient has no known allergies.   Review of Systems Review of Systems  Constitutional: Positive for chills.  Gastrointestinal: Positive for abdominal pain, anal bleeding, blood in stool, diarrhea and vomiting.  Neurological: Positive for weakness. Negative for dizziness and numbness.  All other systems reviewed and are negative.    Physical Exam Updated Vital Signs BP 120/72 (BP Location: Left Arm)   Pulse (!) 110   Temp 98.8 F (37.1 C) (Oral)   Resp 18   Wt 193 lb 1.6 oz (87.6 kg)   LMP 06/27/2016 (Exact Date)   SpO2 100%   BMI 33.15 kg/m   Vital signs normal    Physical Exam  Constitutional: She is oriented to person, place, and time. She appears well-developed and well-nourished.  Non-toxic appearance. She does not appear ill. No distress.  HENT:  Head: Normocephalic and atraumatic.  Right Ear: External ear normal.  Left Ear: External ear normal.  Nose: Nose normal. No mucosal edema or rhinorrhea.  Mouth/Throat: Mucous membranes are dry. No dental abscesses or uvula swelling.  Eyes: Conjunctivae and EOM are normal. Pupils are equal, round, and reactive to light.  Neck: Normal range of motion and full passive range of motion without pain. Neck supple.  Cardiovascular: Normal rate, regular rhythm and normal heart sounds.  Exam reveals no gallop and no friction rub.   No murmur heard. Pulmonary/Chest: Effort normal and breath  sounds normal. No respiratory distress. She has no wheezes. She has no rhonchi. She has no rales. She exhibits no tenderness and no crepitus.  Abdominal: Soft. Normal appearance and bowel sounds are normal. She exhibits no distension. There is no tenderness. There is no rebound and no guarding.  Musculoskeletal: Normal range of motion. She exhibits no edema or tenderness.  Moves all extremities well.   Neurological: She is alert and oriented to person, place, and time. She has normal strength. No cranial nerve deficit.  Skin: Skin is warm, dry and intact. No rash noted. No erythema. No pallor.  Skin has loss of turgor   Psychiatric: She has a normal mood and affect. Her speech is normal and behavior is normal. Her mood appears not anxious.  Nursing note and vitals reviewed.    ED Treatments / Results  DIAGNOSTIC STUDIES: Oxygen Saturation is 100% on RA, NL by my interpretation.    COORDINATION OF CARE: 11:24 PM-Discussed next steps with pt.  Pt verbalized understanding and is agreeable with the plan. Will order fluids and medications then reassess.   Labs (all labs ordered are listed, but only abnormal results are displayed) Results for orders placed or performed during the hospital encounter of 06/29/16  Lipase, blood  Result Value Ref Range   Lipase 33 11 - 51 U/L  Comprehensive metabolic panel  Result Value Ref Range   Sodium 140 135 - 145 mmol/L   Potassium 3.1 (L) 3.5 - 5.1 mmol/L   Chloride 106 101 - 111 mmol/L   CO2 26 22 - 32 mmol/L   Glucose, Bld 96 65 - 99 mg/dL   BUN 9 6 - 20 mg/dL   Creatinine, Ser 1.610.78 0.44 - 1.00 mg/dL   Calcium 8.7 (L) 8.9 - 10.3 mg/dL   Total Protein 8.1 6.5 - 8.1 g/dL   Albumin 3.8 3.5 - 5.0 g/dL   AST 17 15 - 41 U/L   ALT 12 (L) 14 - 54 U/L   Alkaline Phosphatase 60 38 - 126 U/L   Total Bilirubin 0.4 0.3 - 1.2 mg/dL   GFR calc non Af Amer >60 >60 mL/min   GFR calc Af Amer >60 >60 mL/min   Anion gap 8 5 - 15  CBC  Result Value Ref Range    WBC 6.5 4.0 - 10.5 K/uL   RBC 4.37 3.87 - 5.11 MIL/uL   Hemoglobin 11.3 (L) 12.0 - 15.0 g/dL   HCT 09.635.5 (L) 04.536.0 - 40.946.0 %   MCV 81.2 78.0 - 100.0 fL   MCH 25.9 (L) 26.0 - 34.0 pg   MCHC 31.8 30.0 - 36.0 g/dL   RDW 81.115.4 91.411.5 - 78.215.5 %   Platelets 474 (H) 150 - 400 K/uL  Urinalysis, Routine w reflex microscopic  Result Value Ref Range   Color, Urine YELLOW YELLOW   APPearance CLEAR CLEAR   Specific Gravity, Urine 1.023 1.005 - 1.030   pH 5.0 5.0 - 8.0   Glucose, UA NEGATIVE NEGATIVE mg/dL   Hgb urine dipstick SMALL (A) NEGATIVE   Bilirubin Urine NEGATIVE NEGATIVE   Ketones, ur 20 (A) NEGATIVE mg/dL   Protein, ur NEGATIVE NEGATIVE mg/dL   Nitrite NEGATIVE NEGATIVE   Leukocytes, UA SMALL (A) NEGATIVE   RBC / HPF 0-5 0 - 5 RBC/hpf   WBC, UA 0-5 0 - 5 WBC/hpf   Bacteria, UA RARE (A) NONE SEEN   Squamous Epithelial / LPF 6-30 (A) NONE SEEN   Mucous PRESENT   I-Stat beta hCG blood, ED  Result Value Ref Range   I-stat hCG, quantitative <5.0 <5 mIU/mL   Comment 3           Laboratory interpretation all normal except hypokalemia    EKG  EKG Interpretation  Date/Time:  Saturday Jun 30 2016 01:25:27 EDT Ventricular Rate:  89 PR Interval:    QRS Duration: 75 QT Interval:  344 QTC Calculation: 419 R Axis:   72 Text Interpretation:  Sinus rhythm Low voltage, precordial leads No old tracing to compare Confirmed by Devoria AlbeKnapp, Orla Jolliff ([id number]) on 06/30/2016 1:31:25 AM       Radiology No results found.   Study Result   October 07, 2015 CLINICAL DATA:  10 pound weight loss, nausea/ vomiting/ diarrhea, bright red blood in stool, colitis flare  EXAM: CT ABDOMEN AND PELVIS WITH CONTRAST  TECHNIQUE: Multidetector CT imaging of the abdomen and pelvis was performed using the standard protocol following bolus administration of intravenous contrast.  CONTRAST:  100mL ISOVUE-300 IOPAMIDOL (  ISOVUE-300) INJECTION 61%  COMPARISON:  None.  FINDINGS: Lower chest:  Lung bases are  clear.  Hepatobiliary: Liver is within normal limits.  Gallbladder is unremarkable. No intrahepatic or extrahepatic ductal dilatation.  Pancreas: Within normal limits.  Spleen: Within normal limits.  Adrenals/Urinary Tract: Adrenal glands are within normal limits.  Kidneys are within normal limits.  No hydronephrosis.  Bladder is within normal limits.  Stomach/Bowel: Stomach is within normal limits.  No evidence of bowel obstruction.  Terminal ileum appears unremarkable.  Wall thickening involving the ascending colon (coronal image 58). Ahaustral/tubular appearance on the descending colon (coronal image 58). Mild wall thickening involving the rectum (series 2/image 60).  Vascular/Lymphatic: No evidence of abdominal aortic aneurysm.  Small ileocolic lymph nodes in the right lower quadrant measuring up to 7 mm short axis (series 2/ image 44), likely reactive.  No suspicious abdominopelvic lymphadenopathy.  Reproductive: Uterus is within normal limits.  Bilateral ovaries are within normal limits.  Other: No abdominopelvic ascites.  Musculoskeletal: Visualized osseous structures are within normal limits.  IMPRESSION: Colonic wall thickening/inflammatory changes, predominantly involving the ascending colon, suggesting infectious or inflammatory colitis.   Electronically Signed   By: Charline Bills M.D.   On: 10/07/2015 20:25      Procedures Procedures (including critical care time)  Medications Ordered in ED Medications  sodium chloride 0.9 % bolus 1,000 mL (0 mLs Intravenous Stopped 06/30/16 0102)  sodium chloride 0.9 % bolus 1,000 mL (1,000 mLs Intravenous New Bag/Given 06/29/16 2349)  potassium chloride SA (K-DUR,KLOR-CON) CR tablet 40 mEq (40 mEq Oral Given 06/29/16 2349)  ciprofloxacin (CIPRO) IVPB 400 mg (0 mg Intravenous Stopped 06/30/16 0130)  metroNIDAZOLE (FLAGYL) IVPB 500 mg (0 mg Intravenous Stopped 06/30/16 0102)  gi  cocktail (Maalox,Lidocaine,Donnatal) (30 mLs Oral Given 06/30/16 0132)  famotidine (PEPCID) IVPB 20 mg premix (0 mg Intravenous Stopped 06/30/16 0210)  methylPREDNISolone sodium succinate (SOLU-MEDROL) 125 mg/2 mL injection 125 mg (125 mg Intravenous Given 06/30/16 0206)     Initial Impression / Assessment and Plan / ED Course  I have reviewed the triage vital signs and the nursing notes.  Pertinent labs & imaging results that were available during my care of the patient were reviewed by me and considered in my medical decision making (see chart for details).  Patient appeared to be dehydrated, she was started on IV fluids. She was started on IV Cipro and Flagyl. Her potassium was low and she was given oral potassium.  1:20 AM I was called by nursing staff that while she was getting the Cipro she started complaining of a burning in her chest. EKG was done. She was given GI cocktail and IV Pepcid. She did report she had acid reflux on her last endoscopy in September. This may been precipitated by the oral potassium. Unfortunately patient feels it's from the Cipro. She refused to finish it. When I saw her she denies having any more bloody diarrhea while in the ED.  02:14 AM Dr Madelyn Flavors, will admit.   Final Clinical Impressions(s) / ED Diagnoses   Final diagnoses:  Ulcerative colitis with rectal bleeding, unspecified location [facility])  Dehydration  Rectal bleeding  Gastroesophageal reflux disease without esophagitis    Plan admission  Devoria Albe, MD, FACEP   I personally performed the services described in this documentation, which was scribed in my presence. The recorded information has been reviewed and considered.  Devoria Albe, MD, Concha Pyo, MD 06/30/16 [phone]

## 2016-06-30 ENCOUNTER — Observation Stay (HOSPITAL_COMMUNITY): Payer: BC Managed Care – PPO

## 2016-06-30 DIAGNOSIS — E876 Hypokalemia: Secondary | ICD-10-CM | POA: Diagnosis present

## 2016-06-30 DIAGNOSIS — D509 Iron deficiency anemia, unspecified: Secondary | ICD-10-CM | POA: Diagnosis present

## 2016-06-30 DIAGNOSIS — D649 Anemia, unspecified: Secondary | ICD-10-CM | POA: Diagnosis not present

## 2016-06-30 DIAGNOSIS — G8929 Other chronic pain: Secondary | ICD-10-CM | POA: Diagnosis present

## 2016-06-30 DIAGNOSIS — Z79899 Other long term (current) drug therapy: Secondary | ICD-10-CM | POA: Diagnosis not present

## 2016-06-30 DIAGNOSIS — M549 Dorsalgia, unspecified: Secondary | ICD-10-CM | POA: Diagnosis present

## 2016-06-30 DIAGNOSIS — K5190 Ulcerative colitis, unspecified, without complications: Secondary | ICD-10-CM | POA: Diagnosis present

## 2016-06-30 DIAGNOSIS — E669 Obesity, unspecified: Secondary | ICD-10-CM | POA: Diagnosis present

## 2016-06-30 DIAGNOSIS — K51011 Ulcerative (chronic) pancolitis with rectal bleeding: Secondary | ICD-10-CM

## 2016-06-30 DIAGNOSIS — K219 Gastro-esophageal reflux disease without esophagitis: Secondary | ICD-10-CM | POA: Diagnosis present

## 2016-06-30 DIAGNOSIS — Z7952 Long term (current) use of systemic steroids: Secondary | ICD-10-CM | POA: Diagnosis not present

## 2016-06-30 DIAGNOSIS — K51911 Ulcerative colitis, unspecified with rectal bleeding: Secondary | ICD-10-CM | POA: Diagnosis present

## 2016-06-30 DIAGNOSIS — R112 Nausea with vomiting, unspecified: Secondary | ICD-10-CM | POA: Diagnosis present

## 2016-06-30 DIAGNOSIS — M25551 Pain in right hip: Secondary | ICD-10-CM

## 2016-06-30 DIAGNOSIS — Z6832 Body mass index (BMI) 32.0-32.9, adult: Secondary | ICD-10-CM | POA: Diagnosis not present

## 2016-06-30 DIAGNOSIS — E860 Dehydration: Secondary | ICD-10-CM | POA: Diagnosis present

## 2016-06-30 DIAGNOSIS — M79604 Pain in right leg: Secondary | ICD-10-CM | POA: Diagnosis present

## 2016-06-30 LAB — CBC
HCT: 34 % — ABNORMAL LOW (ref 36.0–46.0)
HEMOGLOBIN: 10.7 g/dL — AB (ref 12.0–15.0)
MCH: 25.6 pg — AB (ref 26.0–34.0)
MCHC: 31.5 g/dL (ref 30.0–36.0)
MCV: 81.3 fL (ref 78.0–100.0)
Platelets: 455 10*3/uL — ABNORMAL HIGH (ref 150–400)
RBC: 4.18 MIL/uL (ref 3.87–5.11)
RDW: 15.4 % (ref 11.5–15.5)
WBC: 4 10*3/uL (ref 4.0–10.5)

## 2016-06-30 LAB — BASIC METABOLIC PANEL
ANION GAP: 5 (ref 5–15)
BUN: 5 mg/dL — ABNORMAL LOW (ref 6–20)
CALCIUM: 8.3 mg/dL — AB (ref 8.9–10.3)
CO2: 25 mmol/L (ref 22–32)
Chloride: 109 mmol/L (ref 101–111)
Creatinine, Ser: 0.7 mg/dL (ref 0.44–1.00)
GLUCOSE: 128 mg/dL — AB (ref 65–99)
POTASSIUM: 3.5 mmol/L (ref 3.5–5.1)
Sodium: 139 mmol/L (ref 135–145)

## 2016-06-30 LAB — C DIFFICILE QUICK SCREEN W PCR REFLEX
C DIFFICILE (CDIFF) TOXIN: NEGATIVE
C Diff antigen: NEGATIVE
C Diff interpretation: NOT DETECTED

## 2016-06-30 LAB — C-REACTIVE PROTEIN: CRP: 2.7 mg/dL — AB (ref <1.0)

## 2016-06-30 LAB — SEDIMENTATION RATE: SED RATE: 55 mm/h — AB (ref 0–22)

## 2016-06-30 MED ORDER — ENOXAPARIN SODIUM 40 MG/0.4ML ~~LOC~~ SOLN
40.0000 mg | SUBCUTANEOUS | Status: DC
  Filled 2016-06-30: qty 0.4

## 2016-06-30 MED ORDER — ONDANSETRON HCL 4 MG PO TABS: 4.0000 mg | ORAL_TABLET | Freq: Four times a day (QID) | ORAL | Status: DC | PRN

## 2016-06-30 MED ORDER — ONDANSETRON HCL 4 MG/2ML IJ SOLN: 4.0000 mg | Freq: Four times a day (QID) | INTRAMUSCULAR | Status: DC | PRN

## 2016-06-30 MED ORDER — ACETAMINOPHEN 325 MG PO TABS
650.0000 mg | ORAL_TABLET | Freq: Four times a day (QID) | ORAL | Status: DC | PRN
  Administered 2016-06-30 – 2016-07-02 (×3): 650 mg via ORAL
  Filled 2016-06-30 (×4): qty 2

## 2016-06-30 MED ORDER — GI COCKTAIL ~~LOC~~
30.0000 mL | Freq: Once | ORAL | Status: AC
  Administered 2016-06-30: 30 mL via ORAL

## 2016-06-30 MED ORDER — BALSALAZIDE DISODIUM 750 MG PO CAPS
2250.0000 mg | ORAL_CAPSULE | Freq: Three times a day (TID) | ORAL | Status: DC
Start: 2016-06-30 — End: 2016-07-02
  Administered 2016-07-02: 2250 mg via ORAL
  Filled 2016-06-30 (×8): qty 3

## 2016-06-30 MED ORDER — MORPHINE SULFATE (PF) 4 MG/ML IV SOLN
2.0000 mg | INTRAVENOUS | Status: DC | PRN
  Administered 2016-06-30 – 2016-07-01 (×5): 2 mg via INTRAVENOUS
  Filled 2016-06-30 (×5): qty 1

## 2016-06-30 MED ORDER — METRONIDAZOLE IN NACL 5-0.79 MG/ML-% IV SOLN
500.0000 mg | Freq: Three times a day (TID) | INTRAVENOUS | Status: DC
  Administered 2016-06-30 – 2016-07-02 (×6): 500 mg via INTRAVENOUS
  Filled 2016-06-30 (×6): qty 100

## 2016-06-30 MED ORDER — SODIUM CHLORIDE 0.9 % IV SOLN
INTRAVENOUS | Status: DC
  Administered 2016-06-30: 100 mL/h via INTRAVENOUS
  Administered 2016-06-30 – 2016-07-01 (×3): via INTRAVENOUS

## 2016-06-30 MED ORDER — FAMOTIDINE IN NACL 20-0.9 MG/50ML-% IV SOLN
20.0000 mg | Freq: Once | INTRAVENOUS | Status: AC
  Administered 2016-06-30: 20 mg via INTRAVENOUS
  Filled 2016-06-30: qty 50

## 2016-06-30 MED ORDER — METHYLPREDNISOLONE SODIUM SUCC 125 MG IJ SOLR
125.0000 mg | Freq: Once | INTRAMUSCULAR | Status: AC
  Administered 2016-06-30: 125 mg via INTRAVENOUS
  Filled 2016-06-30: qty 2

## 2016-06-30 MED ORDER — HYDROCORTISONE 2.5 % EX CREA
TOPICAL_CREAM | Freq: Two times a day (BID) | CUTANEOUS | Status: DC
  Administered 2016-06-30 – 2016-07-02 (×5): via TOPICAL
  Filled 2016-06-30: qty 30

## 2016-06-30 MED ORDER — METHYLPREDNISOLONE SODIUM SUCC 125 MG IJ SOLR
60.0000 mg | Freq: Every day | INTRAMUSCULAR | Status: DC
  Administered 2016-06-30 – 2016-07-02 (×3): 60 mg via INTRAVENOUS
  Filled 2016-06-30 (×3): qty 2

## 2016-06-30 MED ORDER — CIPROFLOXACIN IN D5W 400 MG/200ML IV SOLN
400.0000 mg | Freq: Two times a day (BID) | INTRAVENOUS | Status: DC
  Filled 2016-06-30 (×2): qty 200

## 2016-06-30 MED ORDER — ACETAMINOPHEN 650 MG RE SUPP
650.0000 mg | Freq: Four times a day (QID) | RECTAL | Status: DC | PRN
Start: 2016-06-30 — End: 2016-07-02

## 2016-06-30 NOTE — ED Notes (Signed)
Pt. Went to bathroom once and reported of having loose bowel movement with blood, witnessed by American FinancialJosh ,Jeremy Johannech . Unable to save specimen.

## 2016-06-30 NOTE — Consult Note (Signed)
Consultation  Referring Provider:  [facility] hospitalist Dr Katrinka Blazing [place] Care Physician:  Doristine Bosworth, MD Primary Gastroenterologist:  Dr.Peters Charmian [place]  Reason for Consultation:  Bloody diarrhea, Hx Ulcerative colitis  HPI: Robin Mora is a 26 y.o. female who was admitted through the emergency room late last evening after she presented with 1 week history of diarrhea streaked with blood with multiple bowel movements per day, crampy abdominal pain, nausea and intermittent vomiting. She has not had any documented fever or chills. Patient has history of ulcerative colitis and has been followed by Dr. Noe Gens in [facility]. She says the colitis was diagnosed about a year ago on colonoscopy. She had initially been treated with Lialda and then switched to balsalazide which she has been taking since September. Colonoscopy report is visible in our records , and is documented to be a moderate colitis. Path report not available. Patient says she had been doing well and he balsalazide didn't keep her symptoms under good control. She says it did cause some difficulty with insomnia. She apparently was trying to get back in to Dr. Phill Mutter office because she was running out of medication and decreased her dose from 3 tablets per day to 2 tablets per day about a month ago. She also took a course of metronidazole for a bacterial vaginitis a few weeks ago. She has not been on any other new medications or had any other known infectious exposures. She is also complaining of a sharp shooting pain from her right lower back down the back of her leg all the way to her foot which is sharp and burning in nature and has been present over the past month. She says severity has waxed and waned but she is having severe pain this morning. She had plain hip films done in the ER this morning which were negative. She does not have any history of known disc disease. Her family member who is in the room says she's been having  significant symptoms and has been limping for most of the month.   Past Medical History:  Diagnosis Date  . Ulcerative colitis (HCC)     History reviewed. No pertinent surgical history.  Prior to Admission medications   Medication Sig Start Date End Date Taking? Authorizing Provider  balsalazide (COLAZAL) 750 MG capsule Take 2,250 mg by mouth 3 (three) times daily.   Yes [provider]  cetirizine (ZYRTEC ALLERGY) 10 MG tablet Take 1 tablet (10 mg total) by mouth daily. 04/20/16  Yes Doristine Bosworth, MD  cyclobenzaprine (FLEXERIL) 5 MG tablet Take 1 tablet (5 mg total) by mouth 3 (three) times daily as needed for muscle spasms. 04/20/16  Yes Stallings, Zoe A, MD  hydrocortisone 2.5 % cream APPLY TOPICALLY TWICE DAILY FOR 4-6 WEEKS Patient taking differently: Apply 1 application twice daily 06/27/16  Yes Stallings, Zoe A, MD  ketoconazole (NIZORAL) 2 % shampoo APPLY 1 APPLICATION TOPICALLY 2 (TWO) TIMES A WEEK Patient not taking: Reported on 06/29/2016 06/28/16   Doristine Bosworth, MD    Current Facility-Administered Medications  Medication Dose Route Frequency Provider Last Rate Last Dose  . 0.9 %  sodium chloride infusion   Intravenous Continuous Smith, Rondell A, MD 100 mL/hr at 06/30/16 0600    . acetaminophen (TYLENOL) tablet 650 mg  650 mg Oral Q6H PRN Madelyn Flavors A, MD   650 mg at 06/30/16 0542   Or  . acetaminophen (TYLENOL) suppository 650 mg  650 mg Rectal Q6H PRN  Clydie BraunSmith, Rondell A, MD      . balsalazide (COLAZAL) capsule 2,250 mg  2,250 mg Oral TID Madelyn FlavorsSmith, Rondell A, MD      . hydrocortisone 2.5 % cream   Topical BID Smith, Rondell A, MD      . methylPREDNISolone sodium succinate (SOLU-MEDROL) 125 mg/2 mL injection 60 mg  60 mg Intravenous Daily Alekh, Kshitiz, MD      . morphine 4 MG/ML injection 2 mg  2 mg Intravenous Q3H PRN Madelyn FlavorsSmith, Rondell A, MD   2 mg at 06/30/16 [id number]  . ondansetron (ZOFRAN) tablet 4 mg  4 mg Oral Q6H PRN Madelyn FlavorsSmith, Rondell A, MD       Or  .  ondansetron (ZOFRAN) injection 4 mg  4 mg Intravenous Q6H PRN Clydie BraunSmith, Rondell A, MD        Allergies as of 06/29/2016  . (No Known Allergies)    Family History  Problem Relation Age of Onset  . Hyperlipidemia Father   . Hypertension Father   . Sleep apnea Father   . Diabetes Maternal Grandmother   . Diabetes Paternal Grandmother   . Hypertension Paternal Grandmother   . Heart disease Paternal Grandfather   . Hypertension Paternal Grandfather   . Kidney disease Paternal Grandfather   . Diabetes Paternal Grandfather     Social History   Social History  . Marital status: Single    Spouse name: n/a  . Number of children: 0  . 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 Smoking status: Never Smoker  . Smokeless tobacco: Never Used  . Alcohol use No  . Drug use: No  . Sexual activity: Yes    Partners: Male    Birth control/ protection: Condom     Comment: inconsistent condom use   Other Topics Concern  . Not on file   Social History Narrative   In graduate program at [employer] and anticipates a Master's Degree in [place] and [place] American Literature 06/2015. Her undergraduate degree in [employer] is from [employer].   Lives with her sister.    Review of Systems: Pertinent positive and negative review of systems were noted in the above HPI section.  All other review of systems was otherwise negative.Marland Kitchen.  Physical Exam: Vital signs in last 24 hours: Temp:  [98.8 F (37.1 C)] 98.8 F (37.1 C) (05/25 2126) Pulse Rate:  [77-110] 89 (05/26 0523) Resp:  [16-21] 17 (05/26 0200) BP: (103-126)/(64-79) 126/68 (05/26 0523) SpO2:  [97 %-100 %] 97 % (05/26 0523) Weight:  [193 lb (87.5 kg)-193 lb 1.6 oz (87.6 kg)] 193 lb (87.5 kg) (05/26 0523) Last BM Date: 06/30/16 General:   Alert,  Well-developed, well-nourished,Young African-American female pleasant and cooperative in NAD-uncomfortable in the  bed due to right leg pain Head:  Normocephalic and atraumatic. Eyes:  Sclera clear, no icterus.   Conjunctiva pink. Ears:  Normal auditory acuity. Nose:  No deformity, discharge,  or lesions. Mouth:  No deformity or lesions.   Neck:  Supple; no masses or thyromegaly. Lungs:  Clear throughout to auscultation.   No wheezes, crackles, or rhonchi. Heart:  Regular rate and rhythm; no murmurs, clicks, rubs,  or gallops. Abdomen:  Soft, mildly tender rather generalized no guarding or rebound, BS active,nonpalp mass or hsm.   Rectal:  Deferred  Msk:  Symmetrical without gross deformities. . Pulses:  Normal pulses noted. Extremities:  Without clubbing or  edema. Neurologic:  Alert and  oriented x4;  grossly normal neurologically. Skin:  Intact without significant lesions or rashes.. Psych:  Alert and cooperative. Normal mood and affect.  Intake/Output from previous day: 05/25 0701 - 05/26 0700 In: 4536.7 [P.O.:180; I.V.:2191.7; IV Piggyback:2165] Out: -  Intake/Output this shift: No intake/output data recorded.  Lab Results:  Recent Labs  06/29/16 2201 06/30/16 0749  WBC 6.5 4.0  HGB 11.3* 10.7*  HCT 35.5* 34.0*  PLT 474* 455*   BMET  Recent Labs  06/29/16 2201 06/30/16 0749  NA 140 139  K 3.1* 3.5  CL 106 109  CO2 26 25  GLUCOSE 96 128*  BUN 9 5*  CREATININE 0.78 0.70  CALCIUM 8.7* 8.3*   LFT  Recent Labs  06/29/16 2201  PROT 8.1  ALBUMIN 3.8  AST 17  ALT 12*  ALKPHOS 60  BILITOT 0.4   PT/INR No results for input(s): LABPROT, INR in the last 72 hours.     IMPRESSION:  #23 26 year old African-American female diagnosed with ulcerative colitis 1 year ago, followed by [facility]/Dr. Noe Gens. Patient has been managed with balsalazide with good control of her symptoms. Acute onset of crampy abdominal pain and profuse diarrhea with nausea and intermittent vomiting about one week ago. Stool has been streaked with blood. Rule out acute gastroenteritis superimposed  on known UC versus exacerbation of ulcerative colitis. C. difficile quick screen negative #2 anemia secondary to above #3 hypokalemia secondary to above corrected #4 intermittent severe sharp shooting pain from right lower back into right foot 1 month Need to rule out lumbar disc disease with nerve impingement, rule out sciatica though pain seems out of proportion   PLAN: GI pathogen panel Clear liquid diet and advance as tolerated Continue balsalazide 750 mg, but increase to 3 tablets by mouth 3 times a day Continue IV Solu-Medrol for now 60 mg daily IV fluid hydration, IV anti-medics as needed Consider CT of the lumbosacral spine  We will follow-up patient is hospitalized, and return to Dr. Noe Gens care/[facility] on discharge.   Yakir Wenke  06/30/2016, 11:06 AM

## 2016-06-30 NOTE — H&P (Addendum)
History and Physical    Nayara Taplin NLG:[id number] DOB: 1990/02/21 DOA: 06/29/2016  Referring MD/NP/PA: Dr. Tomi Bamberger PCP: Forrest Moron, MD  Patient coming from: home  Chief Complaint: Bloody diarrhea  HPI: Robin Mora is a 26 y.o. female with medical history significant of ulcerative colitis; who presents with complaints of one week of bloody diarrhea. Patient reports having anywhere from 10 or more bowel movements daily.  She also complains of having intermittent sharp periumbilical abdominal pain that is improved following bowel movements. Associated symptoms include She is followed by Dr. Ferdinand Lango of gastroenterology in [facility] for her ulcerative colitis. She has been on medication but in the last month self decreased the dosages from 3 times a day to twice a day as she was running out of the medication. She had not been able to schedule a follow-up appointment or contact their office. Patient reports that she has been given antibiotics in the last 3 months. Patient reports being treated with metronidazole for bacterial vaginosis.  ED Course: On admission to the emergency department patient was seen to be afebrile, heart rates 77-110, and other vital signs maintained. Labs revealed hemoglobin 11.3, platelets 474, and potassium 3.1. While in the emergency department the patient received a GI cocktail, Pepcid, 125 mg Solu-Medrol IV 1 dose, ciprofloxacin, and metronidazole. Patient reported reaction of feeling burning sensation when given ciprofloxacin IV.  Review of Systems: As per HPI otherwise 10 point review of systems negative.   Past Medical History:  Diagnosis Date  . Ulcerative colitis ([place])     History reviewed. No pertinent surgical history.   reports that she has never smoked. She has never used smokeless tobacco. She reports that she does not drink alcohol or use drugs.  No Known Allergies  Family History  Problem Relation Age of Onset  . Hyperlipidemia Father   .  Hypertension Father   . Sleep apnea Father   . Diabetes Maternal Grandmother   . Diabetes Paternal Grandmother   . Hypertension Paternal Grandmother   . Heart disease Paternal Grandfather   . Hypertension Paternal Grandfather   . Kidney disease Paternal Grandfather   . Diabetes Paternal Grandfather     Prior to Admission medications   Medication Sig Start Date End Date Taking? Authorizing Provider  balsalazide (COLAZAL) 750 MG capsule Take 2,250 mg by mouth 3 (three) times daily.   Yes [provider]  cetirizine (ZYRTEC ALLERGY) 10 MG tablet Take 1 tablet (10 mg total) by mouth daily. 04/20/16  Yes Forrest Moron, MD  cyclobenzaprine (FLEXERIL) 5 MG tablet Take 1 tablet (5 mg total) by mouth 3 (three) times daily as needed for muscle spasms. 04/20/16  Yes Stallings, Zoe A, MD  hydrocortisone 2.5 % cream APPLY TOPICALLY TWICE DAILY FOR 4-6 WEEKS Patient taking differently: Apply 1 application twice daily 06/27/16  Yes Stallings, Zoe A, MD  ketoconazole (NIZORAL) 2 % shampoo APPLY 1 APPLICATION TOPICALLY 2 (TWO) TIMES A WEEK Patient not taking: Reported on 06/29/2016 06/28/16   Forrest Moron, MD    Physical Exam:     Constitutional: Young female in NAD, calm, comfortable Vitals:   06/30/16 0100 06/30/16 0127 06/30/16 0130 06/30/16 0200  BP: 107/64 107/64 118/76 117/79  Pulse: 90 96 88 79  Resp:  (!) _0 Temp:      TempSrc:      SpO2: 100% 98% 100% 100%  Weight:       Eyes: PERRL, lids and conjunctivae normal ENMT: Mucous membranes  are dry. Posterior pharynx clear of any exudate or lesions. Normal dentition.  Neck: normal, supple, no masses, no thyromegaly Respiratory: clear to auscultation bilaterally, no wheezing, no crackles. Normal respiratory effort. No accessory muscle use.  Cardiovascular: Regular rate and rhythm, no murmurs / rubs / gallops. No extremity edema. 2+ pedal pulses. No carotid bruits.  Abdomen: mild tenderness, no masses palpated. No  hepatosplenomegaly. Bowel sounds positive.  Musculoskeletal: no clubbing / cyanosis. No joint deformity upper and lower extremities. Good ROM, no contractures. Normal muscle tone.  Skin: no rashes, lesions, ulcers. No induration Neurologic: CN 2-12 grossly intact. Sensation intact, DTR normal. Strength 5/5 in all 4.  Psychiatric: Normal judgment and insight. Alert and oriented x 3. Normal mood.     Labs on Admission: I have personally reviewed following labs and imaging studies  CBC:  Recent Labs Lab 06/29/16 2201  WBC 6.5  HGB 11.3*  HCT 35.5*  MCV 81.2  PLT 856*   Basic Metabolic Panel:  Recent Labs Lab 06/29/16 2201  NA 140  K 3.1*  CL 106  CO2 26  GLUCOSE 96  BUN 9  CREATININE 0.78  CALCIUM 8.7*   GFR: Estimated Creatinine Clearance: 115.2 mL/min (by C-G formula based on SCr of 0.78 mg/dL). Liver Function Tests:  Recent Labs Lab 06/29/16 2201  AST 17  ALT 12*  ALKPHOS 60  BILITOT 0.4  PROT 8.1  ALBUMIN 3.8    Recent Labs Lab 06/29/16 2201  LIPASE 33   No results for input(s): AMMONIA in the last 168 hours. Coagulation Profile: No results for input(s): INR, PROTIME in the last 168 hours. Cardiac Enzymes: No results for input(s): CKTOTAL, CKMB, CKMBINDEX, TROPONINI in the last 168 hours. BNP (last 3 results) No results for input(s): PROBNP in the last 8760 hours. HbA1C: No results for input(s): HGBA1C in the last 72 hours. CBG: No results for input(s): GLUCAP in the last 168 hours. Lipid Profile: No results for input(s): CHOL, HDL, LDLCALC, TRIG, CHOLHDL, LDLDIRECT in the last 72 hours. Thyroid Function Tests: No results for input(s): TSH, T4TOTAL, FREET4, T3FREE, THYROIDAB in the last 72 hours. Anemia Panel: No results for input(s): VITAMINB12, FOLATE, FERRITIN, TIBC, IRON, RETICCTPCT in the last 72 hours. Urine analysis:    Component Value Date/Time   COLORURINE YELLOW 06/29/2016 2303   APPEARANCEUR CLEAR 06/29/2016 2303   LABSPEC 1.023  06/29/2016 2303   PHURINE 5.0 06/29/2016 2303   GLUCOSEU NEGATIVE 06/29/2016 2303   HGBUR SMALL (A) 06/29/2016 2303   BILIRUBINUR NEGATIVE 06/29/2016 2303   BILIRUBINUR negative 03/22/2015 1751   BILIRUBINUR Negative 05/27/2014 1805   KETONESUR 20 (A) 06/29/2016 2303   PROTEINUR NEGATIVE 06/29/2016 2303   UROBILINOGEN 1.0 03/22/2015 1751   UROBILINOGEN >=8.0 03/14/2015 2013   NITRITE NEGATIVE 06/29/2016 2303   LEUKOCYTESUR SMALL (A) 06/29/2016 2303   Sepsis Labs: No results found for this or any previous visit (from the past 240 hour(s)).   Radiological Exams on Admission: No results found.  EKG: Independently reviewed. Normal sinus rhythm  Assessment/Plan Possible flare of ulcerative colitis with rectal bleeding: Patient reports having anywhere from 10-15 bloody bowel movements per day for the last 1 week. Patient reports being given antibiotics metronidazole question other antibiotics being given in the past. - Admit to MedSurg bed - Nothing by mouth except for sips with meds - Check C. Difficile, ESR, CRP - IV Fluids NS at 100 ml/hr - Determination continuation of metronidazole - Consult GI in a.m. for further recommendations  Nausea and  vomiting - Zofran prn N/V  Microchromic anemia: Patient presents with reports of bloody diarrhea 1 week. Hemoglobin 11.3 on admission which appears near previous hemoglobin from 10/2015.  - Follow-up repeat CBC in a.m.   Hypokalemia: Initial potassium 3.1. Patient was given 40 mg of potassium chloride while in the ED. - Continue to monitor placed as needed     DVT prophylaxis: scd   Code Status: Full  Family Communication:  No family present at bedside Disposition Plan: Likely discharge home in a.m.   Consults called: None  Admission status: Observation  Norval Morton MD [facility] Pager [phone]  If 7PM-7AM, please contact night-coverage [email or web address] Password [email or web address]  06/30/2016, 4:03 AM

## 2016-06-30 NOTE — Progress Notes (Signed)
Patient ID: Robin Mora, female   DOB: 02/02/1991, 26 y.o.   MRN: [id number]  PROGRESS NOTE    Robin Mora  NWG:[id number] DOB: 08/20/1990 DOA: 06/29/2016 PCP: [name], Zoe A, MD   Brief Narrative:  26 y.o. female with medical history significant of ulcerative colitis presents with complaints of one week of bloody diarrhea and abdominal pain. She was admitted with probable ulcerative colitis flareup, started on intravenous antibiotics and was given 1 dose of Solu-Medrol.   Assessment & Plan:   Principal Problem:   Ulcerative colitis (HCC) Active Problems:   N&V (nausea and vomiting)   Anemia   Hypokalemia  Possible flare of ulcerative colitis with rectal bleeding:  - Spoke to Dr. Adela LankArmbruster on phone about the patient. He'll see the patient in consultation and recommended Solu Medrol 60 mg IV daily. - Continue IV ciprofloxacin and Flagyl. Start clear liquid diet and advance as tolerated - Stool for C. difficile is negative. Discontinue isolation. Follow-up CRP - Continue IV fluids - We will wait for GI evaluation to see if she needs further imaging of her abdomen. - Continue balsalazide for now  Nausea and vomiting - Zofran prn N/V  Lower back and right hip pain: X-ray of the lumbar spine and x-ray of the right hip. Physical therapy evaluation. Patient might need further workup of the chronic lower back pain as an outpatient.  Chronic anemia:  hemoglobin stable. Repeat a.m. Labs  Hypokalemia:  improved  Thrombocytosis: Probably reactive   DVT prophylaxis: SCDs Code Status:  Full Family Communication: None present at bedside Disposition Plan: Home in 1-2 days if symptoms improve  Consultants: GI  Procedures: None  Antimicrobials: Ciprofloxacin and Flagyl from 06/29/2016   Subjective: Patient seen and examined at bedside. She complains of lower back and right hip pain along with some abdominal pain and diarrhea. No overnight fevers  Objective: Vitals:   06/30/16  0127 06/30/16 0130 06/30/16 0200 06/30/16 0523  BP: 107/64 118/76 117/79 126/68  Pulse: 96 88 79 89  Resp: (!) 21 18 17    Temp:      TempSrc:      SpO2: 98% 100% 100% 97%  Weight:    87.5 kg (193 lb)  Height:    5' 4.5" (1.638 m)    Intake/Output Summary (Last 24 hours) at 06/30/16 1243 Last data filed at 06/30/16 0600  Gross per 24 hour  Intake          4536.67 ml  Output                0 ml  Net          4536.67 ml   Filed Weights   06/29/16 2126 06/30/16 0523  Weight: 87.6 kg (193 lb 1.6 oz) 87.5 kg (193 lb)    Examination:  General exam: Appears In mild distress secondary to pain  Respiratory system: Bilateral decreased breath sound at bases Cardiovascular system: S1 & S2 heard, rate controlled Gastrointestinal system: Abdomen is nondistended, soft, mild diffuse tenderness present. No rebound tenderness. Normal bowel sounds heard. Extremities: No cyanosis, clubbing, edema. Tenderness over the right hip area  Data Reviewed: I have personally reviewed following labs and imaging studies  CBC:  Recent Labs Lab 06/29/16 2201 06/30/16 0749  WBC 6.5 4.0  HGB 11.3* 10.7*  HCT 35.5* 34.0*  MCV 81.2 81.3  PLT 474* 455*   Basic Metabolic Panel:  Recent Labs Lab 06/29/16 2201 06/30/16 0749  NA 140 139  K 3.1* 3.5  CL  106 109  CO2 26 25  GLUCOSE 96 128*  BUN 9 5*  CREATININE 0.78 0.70  CALCIUM 8.7* 8.3*   GFR: Estimated Creatinine Clearance: 116.2 mL/min (by C-G formula based on SCr of 0.7 mg/dL). Liver Function Tests:  Recent Labs Lab 06/29/16 2201  AST 17  ALT 12*  ALKPHOS 60  BILITOT 0.4  PROT 8.1  ALBUMIN 3.8    Recent Labs Lab 06/29/16 2201  LIPASE 33   No results for input(s): AMMONIA in the last 168 hours. Coagulation Profile: No results for input(s): INR, PROTIME in the last 168 hours. Cardiac Enzymes: No results for input(s): CKTOTAL, CKMB, CKMBINDEX, TROPONINI in the last 168 hours. BNP (last 3 results) No results for input(s):  PROBNP in the last 8760 hours. HbA1C: No results for input(s): HGBA1C in the last 72 hours. CBG: No results for input(s): GLUCAP in the last 168 hours. Lipid Profile: No results for input(s): CHOL, HDL, LDLCALC, TRIG, CHOLHDL, LDLDIRECT in the last 72 hours. Thyroid Function Tests: No results for input(s): TSH, T4TOTAL, FREET4, T3FREE, THYROIDAB in the last 72 hours. Anemia Panel: No results for input(s): VITAMINB12, FOLATE, FERRITIN, TIBC, IRON, RETICCTPCT in the last 72 hours. Sepsis Labs: No results for input(s): PROCALCITON, LATICACIDVEN in the last 168 hours.  Recent Results (from the past 240 hour(s))  C difficile quick scan w PCR reflex     Status: None   Collection Time: 06/30/16  4:29 AM  Result Value Ref Range Status   C Diff antigen NEGATIVE NEGATIVE Final   C Diff toxin NEGATIVE NEGATIVE Final   C Diff interpretation No C. difficile detected.  Final         Radiology Studies: Dg Lumbar Spine 2-3 Views  Result Date: 06/30/2016 CLINICAL DATA:  Right hip and low back pain for 2 months without known injury. EXAM: LUMBAR SPINE - 2-3 VIEW COMPARISON:  None. FINDINGS: Moderate dextroscoliosis of lumbar spine is noted. No fracture or spondylolisthesis is noted. Disc spaces are well-maintained. IMPRESSION: Moderate dextroscoliosis of lumbar spine. No acute abnormality is noted. Electronically Signed   By: Lupita Raider, M.D.   On: 06/30/2016 10:11   Dg Hip Unilat With Pelvis 2-3 Views Right  Result Date: 06/30/2016 CLINICAL DATA:  Right hip and low back pain for 2 months without known injury. EXAM: DG HIP (WITH OR WITHOUT PELVIS) 2-3V RIGHT COMPARISON:  None. FINDINGS: There is no evidence of hip fracture or dislocation. There is no evidence of arthropathy or other focal bone abnormality. IMPRESSION: Normal right hip. Electronically Signed   By: Lupita Raider, M.D.   On: 06/30/2016 10:10        Scheduled Meds: . balsalazide  2,250 mg Oral TID  . hydrocortisone    Topical BID  . methylPREDNISolone (SOLU-MEDROL) injection  60 mg Intravenous Daily   Continuous Infusions: . sodium chloride 100 mL/hr at 06/30/16 0600     LOS: 0 days        Glade Lloyd, MD [facility] Pager [phone]  If 7PM-7AM, please contact night-coverage [email or web address] Password [facility] 06/30/2016, 12:43 PM

## 2016-06-30 NOTE — ED Notes (Signed)
Pt. Complaint of heart burn and chest pain when ciprofloxacin IV infusing for 15 mins, asked this Nurse to stop said medicine. Pt. Sat upright , assessed for s/s of allergy to Cipro, denied  itchiness nor redness on the IV site. No swelling . No s/s of SOB. MD notified with orders , carried out. Pt. Walked to bathroom ok.

## 2016-06-30 NOTE — ED Notes (Signed)
Bed: [id number] Expected date:  Expected time:  Means of arrival:  Comments: For Room 8

## 2016-07-01 LAB — GASTROINTESTINAL PANEL BY PCR, STOOL (REPLACES STOOL CULTURE)

## 2016-07-01 LAB — COMPREHENSIVE METABOLIC PANEL
ALBUMIN: 2.8 g/dL — AB (ref 3.5–5.0)
ALT: 10 U/L — ABNORMAL LOW (ref 14–54)
ANION GAP: 5 (ref 5–15)
AST: 12 U/L — ABNORMAL LOW (ref 15–41)
Alkaline Phosphatase: 49 U/L (ref 38–126)
BUN: <5 mg/dL — ABNORMAL LOW (ref 6–20)
CHLORIDE: 110 mmol/L (ref 101–111)
CO2: 24 mmol/L (ref 22–32)
Calcium: 8.2 mg/dL — ABNORMAL LOW (ref 8.9–10.3)
Creatinine, Ser: 0.59 mg/dL (ref 0.44–1.00)
GFR calc non Af Amer: >60 mL/min (ref >60)
GLUCOSE: 98 mg/dL (ref 65–99)
POTASSIUM: 3.4 mmol/L — AB (ref 3.5–5.1)
SODIUM: 139 mmol/L (ref 135–145)
Total Bilirubin: 0.4 mg/dL (ref 0.3–1.2)
Total Protein: 6.1 g/dL — ABNORMAL LOW (ref 6.5–8.1)

## 2016-07-01 LAB — CBC WITH DIFFERENTIAL/PLATELET
BASOS PCT: 0 %
Basophils Absolute: 0 10*3/uL (ref 0.0–0.1)
EOS ABS: 0.1 10*3/uL (ref 0.0–0.7)
EOS PCT: 1 %
HCT: 30.5 % — ABNORMAL LOW (ref 36.0–46.0)
Hemoglobin: 9.6 g/dL — ABNORMAL LOW (ref 12.0–15.0)
Lymphocytes Relative: 26 %
Lymphs Abs: 1.1 10*3/uL (ref 0.7–4.0)
MCH: 25.4 pg — AB (ref 26.0–34.0)
MCHC: 31.5 g/dL (ref 30.0–36.0)
MCV: 80.7 fL (ref 78.0–100.0)
MONOS PCT: 22 %
Monocytes Absolute: 0.9 10*3/uL (ref 0.1–1.0)
NEUTROS PCT: 51 %
Neutro Abs: 2.1 10*3/uL (ref 1.7–7.7)
PLATELETS: 381 10*3/uL (ref 150–400)
RBC: 3.78 MIL/uL — ABNORMAL LOW (ref 3.87–5.11)
RDW: 15.2 % (ref 11.5–15.5)
WBC: 4.2 10*3/uL (ref 4.0–10.5)

## 2016-07-01 LAB — LIPASE, BLOOD: Lipase: 19 U/L (ref 11–51)

## 2016-07-01 MED ORDER — MORPHINE SULFATE (PF) 2 MG/ML IV SOLN
2.0000 mg | INTRAVENOUS | Status: DC | PRN
  Administered 2016-07-01 – 2016-07-02 (×4): 2 mg via INTRAVENOUS
  Filled 2016-07-01 (×5): qty 1

## 2016-07-01 NOTE — Progress Notes (Addendum)
Patient ID: Robin Mora, female   DOB: [birth year], 26 y.o.   MRN: [id number]     Progress Note   Subjective   Leg is hurting a lot- has to keep it bent to decrease pain Still having a lot of diarrhea- up 5 x during night, and 3 x this am-stool liquid- no obvious blood Nausea has resolved  GI path panel pending HGB down to 9.6 ESR 55, CRP 2.7 K+ 3.4   Objective   Vital signs in last 24 hours: Temp:  [98 F (36.7 C)-98.6 F (37 C)] 98.3 F (36.8 C) (05/27 0553) Pulse Rate:  [57-97] 97 (05/27 0553) Resp:  [15-18] 17 (05/27 0553) BP: (101-116)/(60-74) 102/64 (05/27 0553) SpO2:  [96 %-97 %] 97 % (05/27 0553) Last BM Date: 06/30/16 General:    Young AA  female in NAD Heart:  Regular rate and rhythm; no murmurs Lungs: Respirations even and unlabored, lungs CTA bilaterally Abdomen:  Soft, nontender and nondistended. Normal bowel sounds. Extremities:  Without edema. Neurologic:  Alert and oriented,  grossly normal neurologically.-holding right leg up and flexed  Psych:  Cooperative. Normal mood and affect.  Intake/Output from previous day: 05/26 0701 - 05/27 0700 In: 2690 [P.O.:540; I.V.:2100; IV Piggyback:50] Out: 500 [Urine:500] Intake/Output this shift: No intake/output data recorded.  Lab Results:  Recent Labs  06/29/16 2201 06/30/16 0749 07/01/16 0618  WBC 6.5 4.0 4.2  HGB 11.3* 10.7* 9.6*  HCT 35.5* 34.0* 30.5*  PLT 474* 455* 381   BMET  Recent Labs  06/29/16 2201 06/30/16 0749 07/01/16 0618  NA 140 139 139  K 3.1* 3.5 3.4*  CL 106 109 110  CO2 26 25 24   GLUCOSE 96 128* 98  BUN 9 5* <5*  CREATININE 0.78 0.70 0.59  CALCIUM 8.7* 8.3* 8.2*   LFT  Recent Labs  07/01/16 0618  PROT 6.1*  ALBUMIN 2.8*  AST 12*  ALT 10*  ALKPHOS 49  BILITOT 0.4   PT/INR No results for input(s): LABPROT, INR in the last 72 hours.  Studies/Results: Dg Lumbar Spine 2-3 Views  Result Date: 06/30/2016 CLINICAL DATA:  Right hip and low back pain for 2 months  without known injury. EXAM: LUMBAR SPINE - 2-3 VIEW COMPARISON:  None. FINDINGS: Moderate dextroscoliosis of lumbar spine is noted. No fracture or spondylolisthesis is noted. Disc spaces are well-maintained. IMPRESSION: Moderate dextroscoliosis of lumbar spine. No acute abnormality is noted. Electronically Signed   By: Marijo Conception, M.D.   On: 06/30/2016 10:11   Dg Hip Unilat With Pelvis 2-3 Views Right  Result Date: 06/30/2016 CLINICAL DATA:  Right hip and low back pain for 2 months without known injury. EXAM: DG HIP (WITH OR WITHOUT PELVIS) 2-3V RIGHT COMPARISON:  None. FINDINGS: There is no evidence of hip fracture or dislocation. There is no evidence of arthropathy or other focal bone abnormality. IMPRESSION: Normal right hip. Electronically Signed   By: Marijo Conception, M.D.   On: 06/30/2016 10:10       Assessment / Plan:    #1 26 yo female with known ulcerative colitis admitted with one week hx of diarrhea, abd cramping, nausea.  Unclear if this is UC flare vs superimposed acute  Gastroenteritis  Continue IV Solumedrol 75m daily Await GI path panel  Replace k+ Continue Balsalazide 750 TID Consider Flex if no improvement in next 24-48 hours Will check Hep serologies and quantiferon gold in event biologics may be needed  #2 anemia - secondary to above #3 R leg  pain -severe -sounds neuropathic- will order CT L-S spine  Amy Esterwood  07/01/2016, 8:39 AM  Attending Addendum Agree with assessment and plan as outlined.  Suspect this is UC flare, while viral / infectious colitis also possible Perhaps slight improvement since admission. Will continue IV steroids and balsalazide for now. Await stool studies. Check hepatitis serologies and quantiferon gold in case Remicade is needed in the future. Consider flex sig if no improvement in the next 24-48 hours.   [place] Cellar, MD [facility]astroenter[facility] Pager [phone]

## 2016-07-01 NOTE — Progress Notes (Addendum)
Patient ID: Robin Mora, female   DOB: 01-28-1991, 26 y.o.   MRN: [id number]  PROGRESS NOTE    Makaiyah Schweiger  NWG:[id number] DOB: May 07, 1990 DOA: 06/29/2016 PCP: Doristine Bosworth, MD   Brief Narrative:  26 y.o.femalewith medical history significant of ulcerative colitis presents with complaints of one week of bloody diarrhea and abdominal pain. She was admitted with probable ulcerative colitis flareup, started on intravenous antibiotics and Solu-Medrol. GI is evaluating the patient.   Assessment & Plan:   Principal Problem:   Ulcerative colitis (HCC) Active Problems:   N&V (nausea and vomiting)   Anemia   Hypokalemia   Ulcerative colitis, acute, with rectal bleeding (HCC)  Possible flare of ulcerative colitis with rectal bleeding: - GI consultation and follow-up has been appreciated. Continue with IV Solu-Medrol and ciprofloxacin and Flagyl. - Stool for C. difficile is negative.  - Advance diet as tolerated - Stop IV fluids - Resume balsalazide on discharge  Nausea and vomiting - Improved   Lower back and right hip pain: X-ray of the lumbar spine and x-ray of the right hip was unremarkable. MRI of the lumbar sacral spine has been ordered. Physical therapy evaluation. Patient might need further workup of the chronic lower back pain as an outpatient.  Chronic anemia: hemoglobin stable. Repeat a.m. Labs  Hypokalemia:Repeat a.m. labs Thrombocytosis: Probably reactive; resolved   DVT prophylaxis: SCDs Code Status:  Full Family Communication: None present at bedside Disposition Plan: Home in 1-2 days if symptoms improve  Consultants: GI  Procedures: None  Antimicrobials: Ciprofloxacin and Flagyl from 06/29/2016    Subjective: Patient seen and examined at bedside. She still complains of lower back and right hip pain along with some abdominal pain. Her diarrhea has not improved. No overnight fever  Objective: Vitals:   06/30/16 0523 06/30/16 1433 06/30/16  2119 07/01/16 0553  BP: 126/68 101/60 116/74 102/64  Pulse: 89 (!) 57 76 97  Resp:  18 15 17   Temp:  98 F (36.7 C) 98.6 F (37 C) 98.3 F (36.8 C)  TempSrc:  Oral Oral Oral  SpO2: 97% 97% 96% 97%  Weight: 87.5 kg (193 lb)     Height: 5' 4.5" (1.638 m)       Intake/Output Summary (Last 24 hours) at 07/01/16 1406 Last data filed at 07/01/16 0400  Gross per 24 hour  Intake             2150 ml  Output                0 ml  Net             2150 ml   Filed Weights   06/29/16 2126 06/30/16 0523  Weight: 87.6 kg (193 lb 1.6 oz) 87.5 kg (193 lb)    Examination:  General exam: Appears calm and comfortable  Respiratory system: Bilateral decreased breath sound at bases Cardiovascular system: S1 & S2 heard, Rate controlled  Gastrointestinal system: Abdomen is nondistended, soft and mild lower quadrant tenderness present. Normal bowel sounds heard. Extremities: No cyanosis, clubbing, edema   Data Reviewed: I have personally reviewed following labs and imaging studies  CBC:  Recent Labs Lab 06/29/16 2201 06/30/16 0749 07/01/16 0618  WBC 6.5 4.0 4.2  NEUTROABS  --   --  2.1  HGB 11.3* 10.7* 9.6*  HCT 35.5* 34.0* 30.5*  MCV 81.2 81.3 80.7  PLT 474* 455* 381   Basic Metabolic Panel:  Recent Labs Lab 06/29/16 2201 06/30/16 0749 07/01/16 0618  NA  140 139 139  K 3.1* 3.5 3.4*  CL 106 109 110  CO2 26 25 24   GLUCOSE 96 128* 98  BUN 9 5* <5*  CREATININE 0.78 0.70 0.59  CALCIUM 8.7* 8.3* 8.2*   GFR: Estimated Creatinine Clearance: 116.2 mL/min (by C-G formula based on SCr of 0.59 mg/dL). Liver Function Tests:  Recent Labs Lab 06/29/16 2201 07/01/16 0618  AST 17 12*  ALT 12* 10*  ALKPHOS 60 49  BILITOT 0.4 0.4  PROT 8.1 6.1*  ALBUMIN 3.8 2.8*    Recent Labs Lab 06/29/16 2201 07/01/16 0618  LIPASE 33 19   No results for input(s): AMMONIA in the last 168 hours. Coagulation Profile: No results for input(s): INR, PROTIME in the last 168 hours. Cardiac  Enzymes: No results for input(s): CKTOTAL, CKMB, CKMBINDEX, TROPONINI in the last 168 hours. BNP (last 3 results) No results for input(s): PROBNP in the last 8760 hours. HbA1C: No results for input(s): HGBA1C in the last 72 hours. CBG: No results for input(s): GLUCAP in the last 168 hours. Lipid Profile: No results for input(s): CHOL, HDL, LDLCALC, TRIG, CHOLHDL, LDLDIRECT in the last 72 hours. Thyroid Function Tests: No results for input(s): TSH, T4TOTAL, FREET4, T3FREE, THYROIDAB in the last 72 hours. Anemia Panel: No results for input(s): VITAMINB12, FOLATE, FERRITIN, TIBC, IRON, RETICCTPCT in the last 72 hours. Sepsis Labs: No results for input(s): PROCALCITON, LATICACIDVEN in the last 168 hours.  Recent Results (from the past 240 hour(s))  C difficile quick scan w PCR reflex     Status: None   Collection Time: 06/30/16  4:29 AM  Result Value Ref Range Status   C Diff antigen NEGATIVE NEGATIVE Final   C Diff toxin NEGATIVE NEGATIVE Final   C Diff interpretation No C. difficile detected.  Final         Radiology Studies: Dg Lumbar Spine 2-3 Views  Result Date: 06/30/2016 CLINICAL DATA:  Right hip and low back pain for 2 months without known injury. EXAM: LUMBAR SPINE - 2-3 VIEW COMPARISON:  None. FINDINGS: Moderate dextroscoliosis of lumbar spine is noted. No fracture or spondylolisthesis is noted. Disc spaces are well-maintained. IMPRESSION: Moderate dextroscoliosis of lumbar spine. No acute abnormality is noted. Electronically Signed   By: Lupita RaiderJames  Green Jr, M.D.   On: 06/30/2016 10:11   Dg Hip Unilat With Pelvis 2-3 Views Right  Result Date: 06/30/2016 CLINICAL DATA:  Right hip and low back pain for 2 months without known injury. EXAM: DG HIP (WITH OR WITHOUT PELVIS) 2-3V RIGHT COMPARISON:  None. FINDINGS: There is no evidence of hip fracture or dislocation. There is no evidence of arthropathy or other focal bone abnormality. IMPRESSION: Normal right hip. Electronically  Signed   By: Lupita RaiderJames  Green Jr, M.D.   On: 06/30/2016 10:10        Scheduled Meds: . balsalazide  2,250 mg Oral TID  . enoxaparin (LOVENOX) injection  40 mg Subcutaneous Q24H  . hydrocortisone   Topical BID  . methylPREDNISolone (SOLU-MEDROL) injection  60 mg Intravenous Daily   Continuous Infusions: . sodium chloride 100 mL/hr at 07/01/16 0858  . ciprofloxacin    . metronidazole Stopped (07/01/16 0700)     LOS: 1 day        Glade LloydKshitiz Laveah Gloster, MD [facility] Pager [phone]  If 7PM-7AM, please contact night-coverage [email or web address] Password [email or web address] 07/01/2016, 2:06 PM

## 2016-07-01 NOTE — Evaluation (Signed)
Physical Therapy Evaluation Patient Details Name: Robin Mora MRN: [id number] DOB: 10/23/1990 Today's Date: 07/01/2016   History of Present Illness  Pt admitted with blood in stool, diarrhea, back and R hip pain and R LE numbness.  Pt with hx of ulcerative colitis  Clinical Impression  Pt admitted as above and presenting with functional mobility limitations 2* back and R LE pain increasing with mobility.  Pt states she has attempted stretching and meds with temporary relief and heat with exacerbation.  Reviewed sleep positioning, gluteal/low back stretch and applied ice packs (pt reports relief following ice application x 10 min).  Pt reports she has already scheduled OP-PT appt for June 5.    Follow Up Recommendations Outpatient PT (Pt states she has appt scheduled for June 5)    Equipment Recommendations       Recommendations for Other Services       Precautions / Restrictions Precautions Precautions: Fall Restrictions Weight Bearing Restrictions: No RLE Weight Bearing: Weight bearing as tolerated Other Position/Activity Restrictions: Pt tolerating decreased WB R LE with increased distance ambulated      Mobility  Bed Mobility Overal bed mobility: Modified Independent             General bed mobility comments: Pt unassisted to/from bed  Transfers Overall transfer level: Needs assistance Equipment used: Rolling walker (2 wheeled) Transfers: Sit to/from Stand Sit to Stand: Min guard         General transfer comment: to steady with initial standing  Ambulation/Gait Ambulation/Gait assistance: Min guard;Min assist Ambulation Distance (Feet): 40 Feet Assistive device: Rolling walker (2 wheeled) Gait Pattern/deviations: Step-to pattern;Decreased step length - right;Decreased step length - left;Decreased stance time - right;Shuffle;Trunk flexed Gait velocity: decr Gait velocity interpretation: Below normal speed for age/gender General Gait Details: Cues for  sequence, posture and position from RW.  Gait deteriorating with increased distance  Stairs            Wheelchair Mobility    Modified Rankin (Stroke Patients Only)       Balance Overall balance assessment: Needs assistance Sitting-balance support: No upper extremity supported;Feet supported Sitting balance-Leahy Scale: Good     Standing balance support: No upper extremity supported Standing balance-Leahy Scale: Fair                               Pertinent Vitals/Pain Pain Assessment: 0-10 Pain Score: 10-Worst pain ever Pain Location: Back and R LE to foot Pain Descriptors / Indicators: Aching;Burning;Sore;Tightness;Guarding Pain Intervention(s): Limited activity within patient's tolerance;Monitored during session;Ice applied    Home Living Family/patient expects to be discharged to:: Private residence Living Arrangements: Alone Available Help at Discharge: Available PRN/intermittently (TBD)           Home Equipment: None      Prior Function Level of Independence: Independent               Hand Dominance        Extremity/Trunk Assessment   Upper Extremity Assessment Upper Extremity Assessment: Overall WFL for tasks assessed    Lower Extremity Assessment Lower Extremity Assessment: RLE deficits/detail RLE: Unable to fully assess due to pain       Communication   Communication: No difficulties  Cognition Arousal/Alertness: Awake/alert Behavior During Therapy: WFL for tasks assessed/performed Overall Cognitive Status: Within Functional Limits for tasks assessed  General Comments      Exercises     Assessment/Plan    PT Assessment Patient needs continued PT services  PT Problem List Decreased activity tolerance;Decreased balance;Decreased mobility;Decreased knowledge of use of DME;Pain       PT Treatment Interventions DME instruction;Gait training;Stair  training;Functional mobility training;Therapeutic activities;Therapeutic exercise;Balance training;Patient/family education;Modalities    PT Goals (Current goals can be found in the Care Plan section)  Acute Rehab PT Goals Patient Stated Goal: Less pain PT Goal Formulation: With patient Time For Goal Achievement: 07/14/16 Potential to Achieve Goals: Good    Frequency Min 3X/week   Barriers to discharge        Co-evaluation               AM-PAC PT "6 Clicks" Daily Activity  Outcome Measure Difficulty turning over in bed (including adjusting bedclothes, sheets and blankets)?: None Difficulty moving from lying on back to sitting on the side of the bed? : None Difficulty sitting down on and standing up from a chair with arms (e.g., wheelchair, bedside commode, etc,.)?: A Little Help needed moving to and from a bed to chair (including a wheelchair)?: A Little Help needed walking in hospital room?: A Little Help needed climbing 3-5 steps with a railing? : A Little 6 Click Score: 20    End of Session   Activity Tolerance: Patient limited by pain Patient left: in bed;with call bell/phone within reach Nurse Communication: Mobility status PT Visit Diagnosis: Unsteadiness on feet (R26.81)    Time: [id number] PT Time Calculation (min) (ACUTE ONLY): 27 min   Charges:   PT Evaluation $PT Eval Low Complexity: 1 Procedure     PT G Codes:        Pg [phone]   Rennie Hack 07/01/2016, 4:05 PM

## 2016-07-02 LAB — BASIC METABOLIC PANEL
Anion gap: 7 (ref 5–15)
BUN: <5 mg/dL — ABNORMAL LOW (ref 6–20)
CALCIUM: 8.5 mg/dL — AB (ref 8.9–10.3)
CO2: 26 mmol/L (ref 22–32)
CREATININE: 0.63 mg/dL (ref 0.44–1.00)
Chloride: 106 mmol/L (ref 101–111)
GFR calc non Af Amer: >60 mL/min (ref >60)
Glucose, Bld: 96 mg/dL (ref 65–99)
Potassium: 3.7 mmol/L (ref 3.5–5.1)
SODIUM: 139 mmol/L (ref 135–145)

## 2016-07-02 LAB — CBC WITH DIFFERENTIAL/PLATELET
BASOS ABS: 0 10*3/uL (ref 0.0–0.1)
BASOS PCT: 0 %
Eosinophils Absolute: 0 10*3/uL (ref 0.0–0.7)
Eosinophils Relative: 0 %
HCT: 32.7 % — ABNORMAL LOW (ref 36.0–46.0)
Hemoglobin: 10.5 g/dL — ABNORMAL LOW (ref 12.0–15.0)
Lymphocytes Relative: 38 %
Lymphs Abs: 1.5 10*3/uL (ref 0.7–4.0)
MCH: 25.8 pg — ABNORMAL LOW (ref 26.0–34.0)
MCHC: 32.1 g/dL (ref 30.0–36.0)
MCV: 80.3 fL (ref 78.0–100.0)
MONO ABS: 1.3 10*3/uL — AB (ref 0.1–1.0)
MONOS PCT: 32 %
NEUTROS PCT: 30 %
Neutro Abs: 1.2 10*3/uL — ABNORMAL LOW (ref 1.7–7.7)
Platelets: 411 10*3/uL — ABNORMAL HIGH (ref 150–400)
RBC: 4.07 MIL/uL (ref 3.87–5.11)
RDW: 15.4 % (ref 11.5–15.5)
WBC: 4 10*3/uL (ref 4.0–10.5)

## 2016-07-02 LAB — MAGNESIUM: MAGNESIUM: 2.1 mg/dL (ref 1.7–2.4)

## 2016-07-02 MED ORDER — PREDNISONE 10 MG PO TABS: 40.0000 mg | ORAL_TABLET | Freq: Every day | ORAL | 0 refills | Status: AC

## 2016-07-02 MED ORDER — ONDANSETRON HCL 4 MG PO TABS: 4.0000 mg | ORAL_TABLET | Freq: Four times a day (QID) | ORAL | 0 refills | Status: DC | PRN

## 2016-07-02 MED ORDER — OXYCODONE-ACETAMINOPHEN 5-325 MG PO TABS: 1.0000 | ORAL_TABLET | Freq: Four times a day (QID) | ORAL | 0 refills | Status: DC | PRN

## 2016-07-02 NOTE — Progress Notes (Signed)
Date: Jul 02, 2016  Discharge orders review for case management needs.  None found  Rhonda Davis, BSN, RN3, CCM:  [phone] 

## 2016-07-02 NOTE — Discharge Instructions (Addendum)
Follow up with Dr. Peters/Gastroenterology in 1week  1. Follow up with PCP in 1-2 weeks 2. Follow-up with Dr. Peters/gastroenterology in 1 week time 3. His follow-up with primary care provider regarding the need for further imaging of the lower back and right hip including CAT scan/MRI.

## 2016-07-02 NOTE — Progress Notes (Signed)
Discharge instructions were gone over with patient. Presciptions given to patient. Patient aware that she needs to follow up. IV site removed. Patient drove here and is going to drive herself home.

## 2016-07-02 NOTE — Progress Notes (Signed)
Progress Note   Subjective  Patient feels significantly improved today. Loose stools have decreased significantly, much better controlled. States she is "85-90%" improved compared to when she was initially admitted   Objective   Vital signs in last 24 hours: Temp:  [97.4 F (36.3 C)-98.6 F (37 C)] 97.6 F (36.4 C) (05/28 0529) Pulse Rate:  [67-72] 72 (05/28 0529) Resp:  [16-18] 16 (05/28 0529) BP: (104-125)/(51-71) 112/65 (05/28 0529) SpO2:  [93 %-99 %] 99 % (05/28 0529) Last BM Date: 07/01/16 General:    AA female in NAD Heart:  Regular rate and rhythm; no murmurs Lungs: Respirations even and unlabored, lungs CTA bilaterally Abdomen:  Soft, nontender and nondistended.  Extremities:  Without edema. Neurologic:  Alert and oriented,  grossly normal neurologically. Psych:  Cooperative. Normal mood and affect.  Intake/Output from previous day: 05/27 0701 - 05/28 0700 In: 840 [P.O.:840] Out: -  Intake/Output this shift: No intake/output data recorded.  Lab Results:  Recent Labs  06/30/16 0749 07/01/16 0618 07/02/16 0550  WBC 4.0 4.2 4.0  HGB 10.7* 9.6* 10.5*  HCT 34.0* 30.5* 32.7*  PLT 455* 381 411*   BMET  Recent Labs  06/30/16 0749 07/01/16 0618 07/02/16 0550  NA 139 139 139  K 3.5 3.4* 3.7  CL 109 110 106  CO2 25 24 26   GLUCOSE 128* 98 96  BUN 5* <5* <5*  CREATININE 0.70 0.59 0.63  CALCIUM 8.3* 8.2* 8.5*   LFT  Recent Labs  07/01/16 0618  PROT 6.1*  ALBUMIN 2.8*  AST 12*  ALT 10*  ALKPHOS 49  BILITOT 0.4   PT/INR No results for input(s): LABPROT, INR in the last 72 hours.  Studies/Results: Dg Lumbar Spine 2-3 Views  Result Date: 06/30/2016 CLINICAL DATA:  Right hip and low back pain for 2 months without known injury. EXAM: LUMBAR SPINE - 2-3 VIEW COMPARISON:  None. FINDINGS: Moderate dextroscoliosis of lumbar spine is noted. No fracture or spondylolisthesis is noted. Disc spaces are well-maintained. IMPRESSION: Moderate  dextroscoliosis of lumbar spine. No acute abnormality is noted. Electronically Signed   By: Lupita RaiderJames  Green Jr, M.D.   On: 06/30/2016 10:11   Dg Hip Unilat With Pelvis 2-3 Views Right  Result Date: 06/30/2016 CLINICAL DATA:  Right hip and low back pain for 2 months without known injury. EXAM: DG HIP (WITH OR WITHOUT PELVIS) 2-3V RIGHT COMPARISON:  None. FINDINGS: There is no evidence of hip fracture or dislocation. There is no evidence of arthropathy or other focal bone abnormality. IMPRESSION: Normal right hip. Electronically Signed   By: Lupita RaiderJames  Green Jr, M.D.   On: 06/30/2016 10:10       Assessment / Plan:   26 y/o female with history of UC, followed by Dr. Noe GensPeters, presenting with abdominal pain and diarrhea, her typical UC symptoms c/w UC flare.   Treated with IV solumedrol and higher dose Balsalazide with significantly improvement in the past 24 hours. GI pathogen panel returned negative. I think she is stable to go home today.  Recommend the following: - okay to discharge home today - transition to prednisone 40mg  x 2 weeks, then taper by 5mg  per week until done - continue Balsalazide 2.25gm TID  - stop antibiotics - outpatient lumbar imaging, pain regimen per primary service - NO NSAIDS  She can follow up with Dr. Noe GensPeters her primary GI in a few weeks for reassessment.   Please call with questions / concerns/  Ileene PatrickSteven Jamael Hoffmann, MD [facility] Pager  [phone]  

## 2016-07-02 NOTE — Discharge Summary (Signed)
Physician Discharge Summary  Robin Mora ZOX:[id number] DOB: [birth year] DOA: 06/29/2016  PCP: Doristine Bosworth, MD  Admit date: 06/29/2016 Discharge date: 07/02/2016  Admitted From: Home Disposition:  Home  Recommendations for Outpatient Follow-up:  1. Follow up with PCP in 1-2 weeks 2. Follow-up with Dr. Peters/gastroenterology in 1 week time 3. His follow-up with primary care provider regarding the need for further imaging of the lower back and right hip including CAT scan/MRI.  Home Health: No Equipment/Devices: None  Discharge Condition: Stable  CODE STATUS: Full Diet recommendation: Regular  Brief/Interim Summary: 26 y.o.femalewith medical history significant of ulcerative colitis presents with complaints of one week of bloody diarrhea and abdominal pain. She was admitted with probable ulcerative colitis flareup, started on intravenous antibiotics and Solu-Medrol. GI is evaluating the patient. Her symptoms are improving. GI has cleared the patient for discharge.  Discharge Diagnoses:  Principal Problem:   Ulcerative colitis (HCC) Active Problems:   N&V (nausea and vomiting)   Anemia   Hypokalemia   Ulcerative colitis, acute, with rectal bleeding (HCC)  Possible flare of ulcerative colitis with rectal bleeding: - Currently on IV Solu-Medrol and ciprofloxacin and Flagyl. GI consultation and follow-up has been appreciated. - Patient's symptoms have improved. She is tolerating diet - Stool for C. difficile is negative.  -  GI is clearing the patient for discharge. No need for further antibiotics. Patient needs to be on balsalazide 2250 mg 3 times a day. She will be discharged home on prednisone 40 mg by mouth daily for 2 weeks and then the prednisone dose will have to be tapered gradually over 2 months time as per gastroenterology recommendations. She needs to follow up with primary care provider and or gastroenterology regarding the tapering off prednisone.  Nausea and  vomiting - Improved   Lower back and right hip pain: X-ray of the lumbar spine and x-ray of the right hip was unremarkable. MRI of the lumbar sacral spine has been ordered which is pending. - Physical therapy evaluation has been appreciated. Patient has outpatient physical therapy evaluation.  - Patient might need further workup of the chronic lower back pain as an outpatient including either a CAT scan or MRI of the lower back. She can follow up with her primary care provider regarding same.  Chronic anemia:hemoglobin stable.  Hypokalemia:Resolved  Thrombocytosis: Probably reactive; outpatient follow-up Discharge Instructions  Discharge Instructions    Call MD for:  difficulty breathing, headache or visual disturbances    Complete by:  As directed    Call MD for:  extreme fatigue    Complete by:  As directed    Call MD for:  persistant dizziness or light-headedness    Complete by:  As directed    Call MD for:  persistant nausea and vomiting    Complete by:  As directed    Call MD for:  severe uncontrolled pain    Complete by:  As directed    Call MD for:  temperature >100.4    Complete by:  As directed    Diet general    Complete by:  As directed    Increase activity slowly    Complete by:  As directed      Allergies as of 07/02/2016   No Known Allergies     Medication List    TAKE these medications   balsalazide 750 MG capsule Commonly known as:  COLAZAL Take 2,250 mg by mouth 3 (three) times daily.   cetirizine 10 MG tablet Commonly known  as:  ZYRTEC ALLERGY Take 1 tablet (10 mg total) by mouth daily.   cyclobenzaprine 5 MG tablet Commonly known as:  FLEXERIL Take 1 tablet (5 mg total) by mouth 3 (three) times daily as needed for muscle spasms.   hydrocortisone 2.5 % cream APPLY TOPICALLY TWICE DAILY FOR 4-6 WEEKS What changed:  See the new instructions.   ketoconazole 2 % shampoo Commonly known as:  NIZORAL APPLY 1 APPLICATION TOPICALLY 2 (TWO)  TIMES A WEEK   ondansetron 4 MG tablet Commonly known as:  ZOFRAN Take 1 tablet (4 mg total) by mouth every 6 (six) hours as needed for nausea.   oxyCODONE-acetaminophen 5-325 MG tablet Commonly known as:  ROXICET Take 1 tablet by mouth every 6 (six) hours as needed for severe pain.   predniSONE 10 MG tablet Commonly known as:  DELTASONE Take 4 tablets (40 mg total) by mouth daily.      Follow-up Information    Doristine Bosworth, MD Follow up in 1 week(s).   Specialty:  Internal Medicine Contact information: [address] [place] [address] [phone]          No Known Allergies  Consultations:  GI   Procedures/Studies: Dg Lumbar Spine 2-3 Views  Result Date: 06/30/2016 CLINICAL DATA:  Right hip and low back pain for 2 months without known injury. EXAM: LUMBAR SPINE - 2-3 VIEW COMPARISON:  None. FINDINGS: Moderate dextroscoliosis of lumbar spine is noted. No fracture or spondylolisthesis is noted. Disc spaces are well-maintained. IMPRESSION: Moderate dextroscoliosis of lumbar spine. No acute abnormality is noted. Electronically Signed   By: Lupita Raider, M.D.   On: 06/30/2016 10:11   Dg Hip Unilat With Pelvis 2-3 Views Right  Result Date: 06/30/2016 CLINICAL DATA:  Right hip and low back pain for 2 months without known injury. EXAM: DG HIP (WITH OR WITHOUT PELVIS) 2-3V RIGHT COMPARISON:  None. FINDINGS: There is no evidence of hip fracture or dislocation. There is no evidence of arthropathy or other focal bone abnormality. IMPRESSION: Normal right hip. Electronically Signed   By: Lupita Raider, M.D.   On: 06/30/2016 10:10       Subjective: Patient seen and examined at bedside. She feels much better. Her abdominal pain is improving, her diarrhea is still present but improving. Overnight fever. Lower back pain and right hip pain is slightly improving.  Discharge Exam: Vitals:   07/01/16 2047 07/02/16 0529  BP: 125/71 112/65  Pulse: 68 72  Resp: 17 16   Temp: 98.6 F (37 C) 97.6 F (36.4 C)   Vitals:   07/01/16 0553 07/01/16 1417 07/01/16 2047 07/02/16 0529  BP: 102/64 (!) 104/51 125/71 112/65  Pulse: 97 67 68 72  Resp: 17 18 17 16   Temp: 98.3 F (36.8 C) 97.4 F (36.3 C) 98.6 F (37 C) 97.6 F (36.4 C)  TempSrc: Oral Oral Oral Oral  SpO2: 97% 95% 93% 99%  Weight:      Height:        General: Pt is alert, awake, not in acute distress Cardiovascular: RRR, S1/S2 + Respiratory: CTA bilaterally, no wheezing, no rhonchi Abdominal: Soft, Mild lower quadrant tenderness, ND, bowel sounds + Extremities: no edema, no cyanosis    The results of significant diagnostics from this hospitalization (including imaging, microbiology, ancillary and laboratory) are listed below for reference.     Microbiology: Recent Results (from the past 240 hour(s))  C difficile quick scan w PCR reflex     Status: None  Collection Time: 06/30/16  4:29 AM  Result Value Ref Range Status   C Diff antigen NEGATIVE NEGATIVE Final   C Diff toxin NEGATIVE NEGATIVE Final   C Diff interpretation No C. difficile detected.  Final  Gastrointestinal Panel by PCR , Stool     Status: None   Collection Time: 06/30/16 11:36 AM  Result Value Ref Range Status   Campylobacter species NOT DETECTED NOT DETECTED Final   Plesimonas shigelloides NOT DETECTED NOT DETECTED Final   Salmonella species NOT DETECTED NOT DETECTED Final   Yersinia enterocolitica NOT DETECTED NOT DETECTED Final   Vibrio species NOT DETECTED NOT DETECTED Final   Vibrio cholerae NOT DETECTED NOT DETECTED Final   Enteroaggregative E coli (EAEC) NOT DETECTED NOT DETECTED Final   Enteropathogenic E coli (EPEC) NOT DETECTED NOT DETECTED Final   Enterotoxigenic E coli (ETEC) NOT DETECTED NOT DETECTED Final   Shiga like toxin producing E coli (STEC) NOT DETECTED NOT DETECTED Final   Shigella/Enteroinvasive E coli (EIEC) NOT DETECTED NOT DETECTED Final   Cryptosporidium NOT DETECTED NOT DETECTED Final    Cyclospora cayetanensis NOT DETECTED NOT DETECTED Final   Entamoeba histolytica NOT DETECTED NOT DETECTED Final   Giardia lamblia NOT DETECTED NOT DETECTED Final   Adenovirus F40/41 NOT DETECTED NOT DETECTED Final   Astrovirus NOT DETECTED NOT DETECTED Final   Norovirus GI/GII NOT DETECTED NOT DETECTED Final   Rotavirus A NOT DETECTED NOT DETECTED Final   Sapovirus (I, II, IV, and V) NOT DETECTED NOT DETECTED Final     Labs: BNP (last 3 results) No results for input(s): BNP in the last 8760 hours. Basic Metabolic Panel:  Recent Labs Lab 06/29/16 2201 06/30/16 0749 07/01/16 0618 07/02/16 0550  NA 140 139 139 139  K 3.1* 3.5 3.4* 3.7  CL 106 109 110 106  CO2 26 25 24 26   GLUCOSE 96 128* 98 96  BUN 9 5* <5* <5*  CREATININE 0.78 0.70 0.59 0.63  CALCIUM 8.7* 8.3* 8.2* 8.5*  MG  --   --   --  2.1   Liver Function Tests:  Recent Labs Lab 06/29/16 2201 07/01/16 0618  AST 17 12*  ALT 12* 10*  ALKPHOS 60 49  BILITOT 0.4 0.4  PROT 8.1 6.1*  ALBUMIN 3.8 2.8*    Recent Labs Lab 06/29/16 2201 07/01/16 0618  LIPASE 33 19   No results for input(s): AMMONIA in the last 168 hours. CBC:  Recent Labs Lab 06/29/16 2201 06/30/16 0749 07/01/16 0618 07/02/16 0550  WBC 6.5 4.0 4.2 4.0  NEUTROABS  --   --  2.1 1.2*  HGB 11.3* 10.7* 9.6* 10.5*  HCT 35.5* 34.0* 30.5* 32.7*  MCV 81.2 81.3 80.7 80.3  PLT 474* 455* 381 411*   Cardiac Enzymes: No results for input(s): CKTOTAL, CKMB, CKMBINDEX, TROPONINI in the last 168 hours. BNP: Invalid input(s): POCBNP CBG: No results for input(s): GLUCAP in the last 168 hours. D-Dimer No results for input(s): DDIMER in the last 72 hours. Hgb A1c No results for input(s): HGBA1C in the last 72 hours. Lipid Profile No results for input(s): CHOL, HDL, LDLCALC, TRIG, CHOLHDL, LDLDIRECT in the last 72 hours. Thyroid function studies No results for input(s): TSH, T4TOTAL, T3FREE, THYROIDAB in the last 72 hours.  Invalid input(s):  FREET3 Anemia work up No results for input(s): VITAMINB12, FOLATE, FERRITIN, TIBC, IRON, RETICCTPCT in the last 72 hours. Urinalysis    Component Value Date/Time   COLORURINE YELLOW 06/29/2016 2303   APPEARANCEUR CLEAR 06/29/2016  2303   LABSPEC 1.023 06/29/2016 2303   PHURINE 5.0 06/29/2016 2303   GLUCOSEU NEGATIVE 06/29/2016 2303   HGBUR SMALL (A) 06/29/2016 2303   BILIRUBINUR NEGATIVE 06/29/2016 2303   BILIRUBINUR negative 03/22/2015 1751   BILIRUBINUR Negative 05/27/2014 1805   KETONESUR 20 (A) 06/29/2016 2303   PROTEINUR NEGATIVE 06/29/2016 2303   UROBILINOGEN 1.0 03/22/2015 1751   UROBILINOGEN >=8.0 03/14/2015 2013   NITRITE NEGATIVE 06/29/2016 2303   LEUKOCYTESUR SMALL (A) 06/29/2016 2303   Sepsis Labs Invalid input(s): PROCALCITONIN,  WBC,  LACTICIDVEN Microbiology Recent Results (from the past 240 hour(s))  C difficile quick scan w PCR reflex     Status: None   Collection Time: 06/30/16  4:29 AM  Result Value Ref Range Status   C Diff antigen NEGATIVE NEGATIVE Final   C Diff toxin NEGATIVE NEGATIVE Final   C Diff interpretation No C. difficile detected.  Final  Gastrointestinal Panel by PCR , Stool     Status: None   Collection Time: 06/30/16 11:36 AM  Result Value Ref Range Status   Campylobacter species NOT DETECTED NOT DETECTED Final   Plesimonas shigelloides NOT DETECTED NOT DETECTED Final   Salmonella species NOT DETECTED NOT DETECTED Final   Yersinia enterocolitica NOT DETECTED NOT DETECTED Final   Vibrio species NOT DETECTED NOT DETECTED Final   Vibrio cholerae NOT DETECTED NOT DETECTED Final   Enteroaggregative E coli (EAEC) NOT DETECTED NOT DETECTED Final   Enteropathogenic E coli (EPEC) NOT DETECTED NOT DETECTED Final   Enterotoxigenic E coli (ETEC) NOT DETECTED NOT DETECTED Final   Shiga like toxin producing E coli (STEC) NOT DETECTED NOT DETECTED Final   Shigella/Enteroinvasive E coli (EIEC) NOT DETECTED NOT DETECTED Final   Cryptosporidium NOT  DETECTED NOT DETECTED Final   Cyclospora cayetanensis NOT DETECTED NOT DETECTED Final   Entamoeba histolytica NOT DETECTED NOT DETECTED Final   Giardia lamblia NOT DETECTED NOT DETECTED Final   Adenovirus F40/41 NOT DETECTED NOT DETECTED Final   Astrovirus NOT DETECTED NOT DETECTED Final   Norovirus GI/GII NOT DETECTED NOT DETECTED Final   Rotavirus A NOT DETECTED NOT DETECTED Final   Sapovirus (I, II, IV, and V) NOT DETECTED NOT DETECTED Final     Time coordinating discharge: 35 minutes  SIGNED:   Glade LloydKshitiz Dakota Vanwart, MD  [facility] 07/02/2016, 10:03 AM Pager: [phone]  If 7PM-7AM, please contact night-coverage [email or web address] Password [email or web address]

## 2016-07-02 NOTE — Care Management Note (Signed)
Case Management Note  Patient Details  Name: Weston AnnaSaadia Vankirk MRN: [id number] Date of Birth: 06/14/1990  Subjective/Objective:                  26 y.o. female with medical history significant of ulcerative colitis; who presents with complaints of one week of bloody diarrhea. Patient reports having anywhere from 10 or more bowel movements daily.  She also complains of having intermittent sharp periumbilical abdominal pain that is improved following bowel movements. Associated symptoms include She is followed by [facility]. Noe GensPeters [facility] for her ulcerative colitis. She has been on medication but in the last month self decreased the dosages from 3 times a day to twice a day as she was running out of the medication. She had not been able to schedule a follow-up appointment or contact their office. Patient reports that she has been given antibiotics in the last 3 months. Patient reports being treated with metronidazole for bacterial vaginosis.  ED Course: On admission to the emergency department patient was seen to be afebrile, heart rates 77-110, and other vital signs maintained. Labs revealed hemoglobin 11.3, platelets 474, and potassium 3.1. While in the emergency department the patient received a GI cocktail, Pepcid, 125 mg Solu-Medrol IV 1 dose, ciprofloxacin, and metronidazole. Patient reported reaction of feeling burning sensation when given ciprofloxacin IV.  Action/Plan: Date:  Jul 02, 2016  Chart reviewed for concurrent status and case management needs.  Will continue to follow patient progress.  Discharge Planning: following for needs  Expected discharge date: [id number]  Marcelle SmilingRhonda Hannan Tetzlaff, BSN, [place] HeightsRN3, [place]CCM   [phone]   Expected Discharge Date:                  Expected Discharge Plan:  Home/Self Care  In-House Referral:     Discharge planning Services  CM Consult  Post Acute Care Choice:    Choice offered to:     DME Arranged:    DME Agency:     HH Arranged:    HH  Agency:     Status of Service:  In process, will continue to follow  If discussed at Long Length of Stay Meetings, dates discussed:    Additional Comments:  Golda AcreDavis, Asalee Barrette Lynn, RN 07/02/2016, 9:38 AM

## 2016-07-04 ENCOUNTER — Ambulatory Visit (INDEPENDENT_AMBULATORY_CARE_PROVIDER_SITE_OTHER): Payer: BC Managed Care – PPO | Admitting: Family Medicine

## 2016-07-04 ENCOUNTER — Encounter: Payer: Self-pay | Admitting: Family Medicine

## 2016-07-04 VITALS — BP 101/66 | HR 105 | Temp 97.9°F | Resp 18 | Ht 64.5 in | Wt 190.0 lb

## 2016-07-04 DIAGNOSIS — R202 Paresthesia of skin: Secondary | ICD-10-CM | POA: Diagnosis not present

## 2016-07-04 DIAGNOSIS — K5100 Ulcerative (chronic) pancolitis without complications: Secondary | ICD-10-CM

## 2016-07-04 DIAGNOSIS — R2689 Other abnormalities of gait and mobility: Secondary | ICD-10-CM

## 2016-07-04 DIAGNOSIS — N898 Other specified noninflammatory disorders of vagina: Secondary | ICD-10-CM | POA: Diagnosis not present

## 2016-07-04 DIAGNOSIS — M5441 Lumbago with sciatica, right side: Secondary | ICD-10-CM | POA: Diagnosis not present

## 2016-07-04 DIAGNOSIS — R200 Anesthesia of skin: Secondary | ICD-10-CM

## 2016-07-04 DIAGNOSIS — R300 Dysuria: Secondary | ICD-10-CM | POA: Diagnosis not present

## 2016-07-04 LAB — POCT URINALYSIS DIP (MANUAL ENTRY)
BILIRUBIN UA: NEGATIVE mg/dL
Bilirubin, UA: NEGATIVE
Blood, UA: NEGATIVE
Glucose, UA: NEGATIVE mg/dL
Nitrite, UA: NEGATIVE
PROTEIN UA: NEGATIVE mg/dL
Spec Grav, UA: 1.015 (ref 1.010–1.025)
Urobilinogen, UA: 0.2 E.U./dL
pH, UA: 7 (ref 5.0–8.0)

## 2016-07-04 MED ORDER — FLUCONAZOLE 150 MG PO TABS: 150.0000 mg | ORAL_TABLET | Freq: Once | ORAL | 0 refills | Status: AC

## 2016-07-04 MED ORDER — GABAPENTIN 100 MG PO CAPS: ORAL_CAPSULE | ORAL | 0 refills | Status: DC
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
## 2016-07-04 NOTE — Patient Instructions (Addendum)
     IF you received an x-ray today, you will receive an invoice from [facility]. Please contact [facility] at [phone] with questions or concerns regarding your invoice.   IF you received labwork today, you will receive an invoice from [facility]. Please contact [facility] at [phone] with questions or concerns regarding your invoice.   Our billing staff will not be able to assist you with questions regarding bills from these companies.  You will be contacted with the lab results as soon as they are available. The fastest way to get your results is to activate your My Chart account. Instructions are located on the last page of this paperwork. If you have not heard from us regarding the results in 2 weeks, please contact this office.     Paresthesia Paresthesia is an abnormal burning or prickling sensation. This sensation is generally felt in the hands, arms, legs, or feet. However, it may occur in any part of the body. Usually, it is not painful. The feeling may be described as:  Tingling or numbness.  Pins and needles.  Skin crawling.  Buzzing.  Limbs falling asleep.  Itching. Most people experience temporary (transient) paresthesia at some time in their lives. Paresthesia may occur when you breathe too quickly (hyperventilation). It can also occur without any apparent cause. Commonly, paresthesia occurs when pressure is placed on a nerve. The sensation quickly goes away after the pressure is removed. For some people, however, paresthesia is a long-lasting (chronic) condition that is caused by an underlying disorder. If you continue to have paresthesia, you may need further medical evaluation. Follow these instructions at home: Watch your condition for any changes. Taking the following actions may help to lessen any discomfort that you are feeling:  Avoid drinking alcohol.  Try acupuncture or massage to help relieve your symptoms.  Keep all follow-up  visits as directed by your health care provider. This is important. Contact a health care provider if:  You continue to have episodes of paresthesia.  Your burning or prickling feeling gets worse when you walk.  You have pain, cramps, or dizziness.  You develop a rash. Get help right away if:  You feel weak.  You have trouble walking or moving.  You have problems with speech, understanding, or vision.  You feel confused.  You cannot control your bladder or bowel movements.  You have numbness after an injury.  You faint. This information is not intended to replace advice given to you by your health care provider. Make sure you discuss any questions you have with your health care provider. Document Released: 01/12/2002 Document Revised: 06/30/2015 Document Reviewed: 01/18/2014 Elsevier Interactive Patient Education  2017 [employer]

## 2016-07-31 ENCOUNTER — Ambulatory Visit (INDEPENDENT_AMBULATORY_CARE_PROVIDER_SITE_OTHER): Payer: BC Managed Care – PPO | Admitting: Family Medicine

## 2016-07-31 ENCOUNTER — Encounter: Payer: Self-pay | Admitting: Family Medicine

## 2016-07-31 VITALS — BP 107/69 | HR 95 | Temp 98.7°F | Resp 17 | Ht 65.5 in | Wt 195.0 lb

## 2016-07-31 DIAGNOSIS — A64 Unspecified sexually transmitted disease: Secondary | ICD-10-CM | POA: Diagnosis not present

## 2016-07-31 DIAGNOSIS — Z113 Encounter for screening for infections with a predominantly sexual mode of transmission: Secondary | ICD-10-CM | POA: Diagnosis not present

## 2016-07-31 DIAGNOSIS — N898 Other specified noninflammatory disorders of vagina: Secondary | ICD-10-CM

## 2016-07-31 LAB — POCT WET + KOH PREP
Trich by wet prep: ABSENT
Yeast by KOH: ABSENT
Yeast by wet prep: ABSENT

## 2016-07-31 LAB — POC MICROSCOPIC URINALYSIS (UMFC): MUCUS RE: ABSENT

## 2016-07-31 LAB — POCT URINALYSIS DIP (MANUAL ENTRY)
BILIRUBIN UA: NEGATIVE mg/dL
Bilirubin, UA: NEGATIVE
Blood, UA: NEGATIVE
Glucose, UA: NEGATIVE mg/dL
Leukocytes, UA: NEGATIVE
Nitrite, UA: NEGATIVE
PH UA: 5.5 (ref 5.0–8.0)
PROTEIN UA: NEGATIVE mg/dL
SPEC GRAV UA: 1.025 (ref 1.010–1.025)
Urobilinogen, UA: 0.2 E.U./dL

## 2016-07-31 MED ORDER — METRONIDAZOLE 0.75 % VA GEL: 1.0000 | Freq: Every day | VAGINAL | 0 refills | Status: DC

## 2016-07-31 NOTE — Progress Notes (Signed)
Chief Complaint  Patient presents with  . Vaginal Discharge    HPI  Sexually Transmitted Disease Check: Patient presents for sexually transmitted disease check. Sexual history reviewed with the patient.  Previous history of STD:  trichomonas. Current symptoms include vaginal discharge: white, thick and curd-like.  Contraception: condoms.  Past Medical History:  Diagnosis Date  . Ulcerative colitis (HCC)     Current Outpatient Prescriptions  Medication Sig Dispense Refill  . balsalazide (COLAZAL) 750 MG capsule Take 2,250 mg by mouth 3 (three) times daily.    . cetirizine (ZYRTEC ALLERGY) 10 MG tablet Take 1 tablet (10 mg total) by mouth daily. 30 tablet 11  . cyclobenzaprine (FLEXERIL) 5 MG tablet Take 1 tablet (5 mg total) by mouth 3 (three) times daily as needed for muscle spasms. 30 tablet 1  . hydrocortisone 2.5 % cream APPLY TOPICALLY TWICE DAILY FOR 4-6 WEEKS (Patient taking differently: Apply 1 application twice daily) 28 g 0  . ketoconazole (NIZORAL) 2 % shampoo APPLY 1 APPLICATION TOPICALLY 2 (TWO) TIMES A WEEK 120 mL 0  . metroNIDAZOLE (METROGEL VAGINAL) 0.75 % vaginal gel Place 1 Applicatorful vaginally at bedtime. For 5 days 70 g 0   No current facility-administered medications for this visit.     Allergies: No Known Allergies  No past surgical history on file.  Social History   Social History  . Marital status: Single    Spouse name: n/a  . Number of children: 0  . 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 Smoking status: Never Smoker  . Smokeless tobacco: Never Used  . Alcohol use No  . Drug use: No  . Sexual activity: Yes    Partners: Male    Birth control/ protection: Condom     Comment: inconsistent condom use   Other Topics Concern  . None   Social History Narrative   In graduate program at [employer] and anticipates a Master's Degree in [place] and African American  Literature 06/2015. Her undergraduate degree in [employer] is from [employer].   Lives with her sister.    ROS  + vag discharge No vaginal itching No hematuria or dysuria  Objective: Vitals:   07/31/16 1641  BP: 107/69  Pulse: 95  Resp: 17  Temp: 98.7 F (37.1 C)  TempSrc: Oral  SpO2: 98%  Weight: 195 lb (88.5 kg)  Height: 5' 5.5" (1.664 m)    Physical Exam   Vaginal exam Chaperone present Labia normal bilaterally without skin lesions Urethral meatus normal appearing without erythema Vagina with white discharge No CMT, ovaries small and not palpable Uterus midline, nontender  Assessment and Plan Estanislado PandySaadia was seen today for vaginal discharge.  Diagnoses and all orders for this visit:  Vaginal discharge- discharge on wet prep showed BV -     POCT Microscopic Urinalysis ([facility]) -     POCT urinalysis dipstick -     GC/Chlamydia Probe Amp  Screen for STD (sexually transmitted disease)- screenings -     GC/Chlamydia Probe Amp -     HIV antibody -     RPR -     POCT Wet + KOH Prep  STD (female) -     POCT Microscopic Urinalysis ([facility]) -     POCT urinalysis dipstick  Other orders -     metroNIDAZOLE (METROGEL VAGINAL) 0.75 % vaginal gel; Place 1 Applicatorful vaginally at bedtime. For 5 days  [place]

## 2016-07-31 NOTE — Patient Instructions (Addendum)
   IF you received an x-ray today, you will receive an invoice from [facility]. Please contact [facility] at [phone] with questions or concerns regarding your invoice.   IF you received labwork today, you will receive an invoice from [facility]. Please contact [facility] at [phone] with questions or concerns regarding your invoice.   Our billing staff will not be able to assist you with questions regarding bills from these companies.  You will be contacted with the lab results as soon as they are available. The fastest way to get your results is to activate your My Chart account. Instructions are located on the last page of this paperwork. If you have not heard from us regarding the results in 2 weeks, please contact this office.     Bacterial Vaginosis Bacterial vaginosis is a vaginal infection that occurs when the normal balance of bacteria in the vagina is disrupted. It results from an overgrowth of certain bacteria. This is the most common vaginal infection among women ages 15-44. Because bacterial vaginosis increases your risk for STIs (sexually transmitted infections), getting treated can help reduce your risk for chlamydia, gonorrhea, herpes, and HIV (human immunodeficiency virus). Treatment is also important for preventing complications in pregnant women, because this condition can cause an early (premature) delivery. What are the causes? This condition is caused by an increase in harmful bacteria that are normally present in small amounts in the vagina. However, the reason that the condition develops is not fully understood. What increases the risk? The following factors may make you more likely to develop this condition:  Having a new sexual partner or multiple sexual partners.  Having unprotected sex.  Douching.  Having an intrauterine device (IUD).  Smoking.  Drug and alcohol abuse.  Taking certain antibiotic medicines.  Being  pregnant.  You cannot get bacterial vaginosis from toilet seats, bedding, swimming pools, or contact with objects around you. What are the signs or symptoms? Symptoms of this condition include:  Grey or white vaginal discharge. The discharge can also be watery or foamy.  A fish-like odor with discharge, especially after sexual intercourse or during menstruation.  Itching in and around the vagina.  Burning or pain with urination.  Some women with bacterial vaginosis have no signs or symptoms. How is this diagnosed? This condition is diagnosed based on:  Your medical history.  A physical exam of the vagina.  Testing a sample of vaginal fluid under a microscope to look for a large amount of bad bacteria or abnormal cells. Your health care provider may use a cotton swab or a small wooden spatula to collect the sample.  How is this treated? This condition is treated with antibiotics. These may be given as a pill, a vaginal cream, or a medicine that is put into the vagina (suppository). If the condition comes back after treatment, a second round of antibiotics may be needed. Follow these instructions at home: Medicines  Take over-the-counter and prescription medicines only as told by your health care provider.  Take or use your antibiotic as told by your health care provider. Do not stop taking or using the antibiotic even if you start to feel better. General instructions  If you have a female sexual partner, tell her that you have a vaginal infection. She should see her health care provider and be treated if she has symptoms. If you have a female sexual partner, he does not need treatment.  During treatment: ? Avoid sexual activity until you finish treatment. ?   Do not douche. ? Avoid alcohol as directed by your health care provider. ? Avoid breastfeeding as directed by your health care provider.  Drink enough water and fluids to keep your urine clear or pale yellow.  Keep the  area around your vagina and rectum clean. ? Wash the area daily with warm water. ? Wipe yourself from front to back after using the toilet.  Keep all follow-up visits as told by your health care provider. This is important. How is this prevented?  Do not douche.  Wash the outside of your vagina with warm water only.  Use protection when having sex. This includes latex condoms and dental dams.  Limit how many sexual partners you have. To help prevent bacterial vaginosis, it is best to have sex with just one partner (monogamous).  Make sure you and your sexual partner are tested for STIs.  Wear cotton or cotton-lined underwear.  Avoid wearing tight pants and pantyhose, especially during summer.  Limit the amount of alcohol that you drink.  Do not use any products that contain nicotine or tobacco, such as cigarettes and e-cigarettes. If you need help quitting, ask your health care provider.  Do not use illegal drugs. Where to find more information:  Centers for Disease Control and Prevention: www.cdc.gov/std  American Sexual Health Association (ASHA): www.ashastd.org  U.S. Department of Health and Human Services, Office on Women's Health: www.womenshealth.gov/ or https://www.womenshealth.gov/a-z-topics/bacterial-vaginosis Contact a health care provider if:  Your symptoms do not improve, even after treatment.  You have more discharge or pain when urinating.  You have a fever.  You have pain in your abdomen.  You have pain during sex.  You have vaginal bleeding between periods. Summary  Bacterial vaginosis is a vaginal infection that occurs when the normal balance of bacteria in the vagina is disrupted.  Because bacterial vaginosis increases your risk for STIs (sexually transmitted infections), getting treated can help reduce your risk for chlamydia, gonorrhea, herpes, and HIV (human immunodeficiency virus). Treatment is also important for preventing complications in  pregnant women, because the condition can cause an early (premature) delivery.  This condition is treated with antibiotic medicines. These may be given as a pill, a vaginal cream, or a medicine that is put into the vagina (suppository). This information is not intended to replace advice given to you by your health care provider. Make sure you discuss any questions you have with your health care provider. Document Released: 01/22/2005 Document Revised: 10/08/2015 Document Reviewed: 10/08/2015 Elsevier Interactive Patient Education  2017 Elsevier Inc.  

## 2016-08-01 ENCOUNTER — Telehealth: Payer: Self-pay | Admitting: Family Medicine

## 2016-08-01 LAB — GC/CHLAMYDIA PROBE AMP
Chlamydia trachomatis, NAA: NEGATIVE
NEISSERIA GONORRHOEAE BY PCR: NEGATIVE

## 2016-08-01 LAB — RPR: RPR: NONREACTIVE

## 2016-08-01 LAB — HIV ANTIBODY (ROUTINE TESTING W REFLEX): HIV SCREEN 4TH GENERATION: NONREACTIVE

## 2016-08-01 MED ORDER — METRONIDAZOLE 500 MG PO TABS: 500.0000 mg | ORAL_TABLET | Freq: Two times a day (BID) | ORAL | 0 refills | Status: AC

## 2016-08-01 NOTE — Telephone Encounter (Signed)
Please let the patient know that her metronidazole is now in the pill form at the pharmacy

## 2016-08-01 NOTE — Telephone Encounter (Signed)
Please advise and order rx if appropriate.

## 2016-08-01 NOTE — Telephone Encounter (Signed)
Advised pt that the rx that was requested was sent into her pharmacy.

## 2016-08-01 NOTE — Telephone Encounter (Signed)
STALLINGS PT STATES THAT THE GEL THAT WAS PER SCRIBED FOR HER YESTERDAY COST TO MUCH WOULD JUST LIKE THE ORAL PILLS

## 2016-08-07 ENCOUNTER — Telehealth: Payer: Self-pay | Admitting: *Deleted

## 2016-08-07 NOTE — Telephone Encounter (Signed)
Pharmacy requesting refill on Gabapentin 100 mg. The medication is not on her list, please advise or refill the medication. Thanks.

## 2016-08-09 ENCOUNTER — Other Ambulatory Visit: Payer: Self-pay | Admitting: Family Medicine

## 2016-08-09 MED ORDER — GABAPENTIN 300 MG PO CAPS: 300.0000 mg | ORAL_CAPSULE | Freq: Three times a day (TID) | ORAL | 3 refills | Status: DC

## 2016-08-09 NOTE — Progress Notes (Signed)
Gabapentin sent in.

## 2016-08-13 ENCOUNTER — Ambulatory Visit (INDEPENDENT_AMBULATORY_CARE_PROVIDER_SITE_OTHER): Payer: BC Managed Care – PPO | Admitting: Orthopedic Surgery

## 2016-09-05 ENCOUNTER — Encounter: Payer: Self-pay | Admitting: Family Medicine

## 2016-09-05 ENCOUNTER — Ambulatory Visit (INDEPENDENT_AMBULATORY_CARE_PROVIDER_SITE_OTHER): Payer: BC Managed Care – PPO | Admitting: Family Medicine

## 2016-09-05 VITALS — BP 115/81 | HR 88 | Temp 97.5°F | Resp 17 | Ht 65.5 in | Wt 202.8 lb

## 2016-09-05 DIAGNOSIS — L729 Follicular cyst of the skin and subcutaneous tissue, unspecified: Secondary | ICD-10-CM

## 2016-09-05 DIAGNOSIS — R519 Headache, unspecified: Secondary | ICD-10-CM

## 2016-09-05 DIAGNOSIS — R51 Headache: Secondary | ICD-10-CM

## 2016-09-05 MED ORDER — FLUTICASONE PROPIONATE 50 MCG/ACT NA SUSP: 2.0000 | Freq: Every day | NASAL | 6 refills | Status: DC

## 2016-09-05 NOTE — Progress Notes (Signed)
Chief Complaint  Patient presents with  . knot on left side of forehead    x  1 yr and knot is getting bigger  . headaches    x 2 mos intermittent, pain level 6/10 with ha's and not taking any otc medications for pain    HPI   Subcutaneous cyst Pt reports a knot on her left forehead that is getting bigger She is concerned about a cyst She reports that she feels like it has enlarged in the past 6 months  Intermittent headache She reports a headache in the left frontal lobe that is intermittent and is about once a week She reports that she has been having pain that is about 6/10 She is not sure if it is sinus related She is congested now but the headache occurs even if she is not congested  Past Medical History:  Diagnosis Date  . Ulcerative colitis (HCC)     Current Outpatient Prescriptions  Medication Sig Dispense Refill  . balsalazide (COLAZAL) 750 MG capsule Take 2,250 mg by mouth 3 (three) times daily.    . cetirizine (ZYRTEC ALLERGY) 10 MG tablet Take 1 tablet (10 mg total) by mouth daily. 30 tablet 11  . hydrocortisone 2.5 % cream APPLY TOPICALLY TWICE DAILY FOR 4-6 WEEKS (Patient taking differently: Apply 1 application twice daily) 28 g 0  . ketoconazole (NIZORAL) 2 % shampoo APPLY 1 APPLICATION TOPICALLY 2 (TWO) TIMES A WEEK 120 mL 0  . fluticasone (FLONASE) 50 MCG/ACT nasal spray Place 2 sprays into both nostrils daily. 16 g 6   No current facility-administered medications for this visit.     Allergies: No Known Allergies  History reviewed. No pertinent surgical history.  Social History   Social History  . Marital status: Single    Spouse name: n/a  . Number of children: 0  . 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 Smoking status: Never Smoker  . Smokeless tobacco: Never Used  . Alcohol use No  . Drug use: No  . Sexual activity: Yes    Partners: Male    Birth control/  protection: Condom     Comment: inconsistent condom use   Other Topics Concern  . None   Social History Narrative   In graduate program at [employer] and anticipates a Master's Degree in [place] and African American Literature 06/2015. Her undergraduate degree in [employer] is from [employer].   Lives with her sister.    ROS Review of Systems See HPI Constitution: No fevers or chills No malaise No diaphoresis Skin: No rash or itching Eyes: no blurry vision, no double vision GU: no dysuria or hematuria Neuro: no dizziness or headaches  Objective: Vitals:   09/05/16 1353  BP: 115/81  Pulse: 88  Resp: 17  Temp: (!) 97.5 F (36.4 C)  TempSrc: Oral  SpO2: 100%  Weight: 202 lb 12.8 oz (92 kg)  Height: 5' 5.5" (1.664 m)    Physical Exam General: alert, oriented, in NAD Head: normocephalic, atraumatic, no sinus tenderness Eyes: EOM intact, no scleral icterus or conjunctival injection Ears: TM clear bilaterally Nose: +mucosa erythematous, +edematous Throat: no pharyngeal exudate or erythema Lymph: no posterior auricular, submental or cervical lymph adenopathy Heart: normal rate, normal sinus rhythm, no murmurs Lungs: clear to auscultation bilaterally, no wheezing   Assessment and Plan Estanislado PandySaadia was seen today for knot on left side of forehead and headaches.  Diagnoses and all orders for this visit:  Intermittent headache -  Advised pt to add flonase  Subcutaneous cyst -  Follow up with Dermatology Pt is established with Dermatology  Other orders -     fluticasone (FLONASE) 50 MCG/ACT nasal spray; Place 2 sprays into both nostrils daily.     Tara Rud A Aqeel Norgaard

## 2016-09-05 NOTE — Patient Instructions (Addendum)
     IF you received an x-ray today, you will receive an invoice from [facility]. Please contact [facility] at [phone] with questions or concerns regarding your invoice.   IF you received labwork today, you will receive an invoice from [facility]. Please contact [facility] at [phone] with questions or concerns regarding your invoice.   Our billing staff will not be able to assist you with questions regarding bills from these companies.  You will be contacted with the lab results as soon as they are available. The fastest way to get your results is to activate your My Chart account. Instructions are located on the last page of this paperwork. If you have not heard from us regarding the results in 2 weeks, please contact this office.     Sinus Headache A sinus headache occurs when the paranasal sinuses become clogged or swollen. Paranasal sinuses are air pockets within the bones of the face. Sinus headaches can range from mild to severe. What are the causes? A sinus headache can result from various conditions that affect the sinuses, such as:  Colds.  Sinus infections.  Allergies.  What are the signs or symptoms? The main symptom of this condition is a headache that may feel like pain or pressure in the face, forehead, ears, or upper teeth. People who have a sinus headache often have other symptoms, such as:  Congested or runny nose.  Fever.  Inability to smell.  Weather changes can make symptoms worse. How is this diagnosed? This condition may be diagnosed based on:  A physical exam and medical history.  Imaging tests, such as a CT scan and MRI, to check for problems with the sinuses.  A specialist may look into the sinuses with a tool that has a camera (endoscopy).  How is this treated? Treatment for this condition depends on the cause.  Sinus pain that is caused by a sinus infection may be treated with antibiotic medicine.  Sinus pain that is  caused by allergies may be helped by allergy medicines (antihistamines) and medicated nasal sprays.  Sinus pain that is caused by congestion may be helped by flushing the nose and sinuses with saline solution.  Follow these instructions at home:  Take medicines only as directed by your health care provider.  If you were prescribed an antibiotic medicine, finish all of it even if you start to feel better.  If you have congestion, use a nasal spray to help reduce pressure.  If directed, apply a warm, moist washcloth to your face to help relieve pain. Contact a health care provider if:  You have headaches more than one time each week.  You have sensitivity to light or sound.  You have a fever.  You feel sick to your stomach (nauseous) or you throw up (vomit).  Your headaches do not get better with treatment. Many people think that they have a sinus headache when they actually have migraines or tension headaches. Get help right away if:  You have vision problems.  You have sudden, severe pain in your face or head.  You have a seizure.  You are confused.  You have a stiff neck. This information is not intended to replace advice given to you by your health care provider. Make sure you discuss any questions you have with your health care provider. Document Released: 03/01/2004 Document Revised: 09/18/2015 Document Reviewed: 01/18/2014 Elsevier Interactive Patient Education  2018 Elsevier Inc.  

## 2016-10-11 ENCOUNTER — Ambulatory Visit (INDEPENDENT_AMBULATORY_CARE_PROVIDER_SITE_OTHER): Payer: BC Managed Care – PPO | Admitting: Family Medicine

## 2016-10-11 ENCOUNTER — Encounter: Payer: Self-pay | Admitting: Family Medicine

## 2016-10-11 VITALS — BP 117/80 | HR 72 | Temp 98.0°F | Resp 18 | Ht 65.5 in | Wt 199.4 lb

## 2016-10-11 DIAGNOSIS — A5901 Trichomonal vulvovaginitis: Secondary | ICD-10-CM

## 2016-10-11 DIAGNOSIS — N898 Other specified noninflammatory disorders of vagina: Secondary | ICD-10-CM | POA: Diagnosis not present

## 2016-10-11 DIAGNOSIS — B9689 Other specified bacterial agents as the cause of diseases classified elsewhere: Secondary | ICD-10-CM | POA: Diagnosis not present

## 2016-10-11 DIAGNOSIS — N760 Acute vaginitis: Secondary | ICD-10-CM | POA: Diagnosis not present

## 2016-10-11 DIAGNOSIS — L219 Seborrheic dermatitis, unspecified: Secondary | ICD-10-CM | POA: Diagnosis not present

## 2016-10-11 LAB — POCT WET + KOH PREP
YEAST BY KOH: ABSENT
Yeast by wet prep: ABSENT

## 2016-10-11 MED ORDER — FLUCONAZOLE 150 MG PO TABS: 150.0000 mg | ORAL_TABLET | Freq: Once | ORAL | 0 refills | Status: AC

## 2016-10-11 MED ORDER — HYDROCORTISONE 2.5 % EX CREA: TOPICAL_CREAM | CUTANEOUS | 0 refills | Status: DC

## 2016-10-11 MED ORDER — METRONIDAZOLE 500 MG PO TABS: 500.0000 mg | ORAL_TABLET | Freq: Two times a day (BID) | ORAL | 0 refills | Status: DC

## 2016-10-11 MED ORDER — METRONIDAZOLE 500 MG PO TABS: 2000.0000 mg | ORAL_TABLET | Freq: Once | ORAL | 0 refills | Status: DC

## 2016-10-11 MED ORDER — KETOCONAZOLE 2 % EX SHAM: 1.0000 "application " | MEDICATED_SHAMPOO | CUTANEOUS | 1 refills | Status: DC

## 2016-10-11 NOTE — Progress Notes (Signed)
Chief Complaint  Patient presents with  . Vaginal Discharge    onset before menses on 8/31 and continues, odor and white color  . Medication Refill    nizoral shampoo and hydrocortisone cream    HPI   Seborrheic dermatitis She reports that she has been doing better with the scalp irritation She reports that if she is dehydrated she has more flaking of the scalp She states that she still uses her hydrocortisone and her nizoral shampoo  Vaginal discharge She reports that she has been having vaginal discharge since  Patient's last menstrual period was 10/05/2016. No itching There is an odor Her last intercourse was 10/07/16 she denies pelvic pain No condoms  Past Medical History:  Diagnosis Date  . Ulcerative colitis (HCC)     Current Outpatient Prescriptions  Medication Sig Dispense Refill  . balsalazide (COLAZAL) 750 MG capsule Take 2,250 mg by mouth 3 (three) times daily.    . cetirizine (ZYRTEC ALLERGY) 10 MG tablet Take 1 tablet (10 mg total) by mouth daily. 30 tablet 11  . fluticasone (FLONASE) 50 MCG/ACT nasal spray Place 2 sprays into both nostrils daily. 16 g 6  . hydrocortisone 2.5 % cream APPLY TOPICALLY TWICE DAILY FOR 4-6 WEEKS 28 g 0  . ketoconazole (NIZORAL) 2 % shampoo Apply 1 application topically 2 (two) times Robin week. 120 mL 1  . fluconazole (DIFLUCAN) 150 MG tablet Take 1 tablet (150 mg total) by mouth once. 1 tablet 0  . metroNIDAZOLE (FLAGYL) 500 MG tablet Take 1 tablet (500 mg total) by mouth 2 (two) times daily. 14 tablet 0   No current facility-administered medications for this visit.     Allergies: No Known Allergies  History reviewed. No pertinent surgical history.  Social History   Social History  . Marital status: Single    Spouse name: n/Robin  . Number of children: 0  . Years of education: college   Occupational History  . Teacher     Administrator   Social History Main Topics  . Smoking status: Never Smoker  . Smokeless tobacco:  Never Used  . Alcohol use No  . Drug use: No  . Sexual activity: Yes    Partners: Male    Birth control/ protection: Condom     Comment: inconsistent condom use   Other Topics Concern  . None   Social History Narrative   In graduate program at [employer] and anticipates Robin Master's Degree in [place] and African American Literature 06/2015. Her undergraduate degree in [employer] is from [employer].   Lives with her sister.    ROS Review of Systems See HPI Constitution: No fevers or chills No malaise No diaphoresis Skin: No rash or itching Eyes: no blurry vision, no double vision GU: no dysuria or hematuria Neuro: no dizziness or headaches  Objective: Vitals:   10/11/16 1633  BP: 117/80  Pulse: 72  Resp: 18  Temp: 98 F (36.7 C)  TempSrc: Oral  SpO2: 99%  Weight: 199 lb 6.4 oz (90.4 kg)  Height: 5' 5.5" (1.664 m)    Physical Exam  Constitutional: She is oriented to person, place, and time. She appears well-developed and well-nourished.  HENT:  Head: Normocephalic and atraumatic.  Cardiovascular: Normal rate, regular rhythm and normal heart sounds.   Pulmonary/Chest: Effort normal and breath sounds normal. No respiratory distress. She has no wheezes.  Neurological: She is alert and oriented to person, place, and time.  Psychiatric: She has Robin normal mood and affect. Her  behavior is normal. Judgment and thought content normal.   Vaginal exam deferred since pt did her wet prep in the bathroom  Wet prep- +clue cells +trich +wbc , no yeast   Assessment and Plan Robin Mora was seen today for vaginal discharge and medication refill.  Diagnoses and all orders for this visit:  Vaginal discharge- pt positive for trich and BV  Will treat with flagyl for both Diflucan to prevent yeast Will screen for std -     POCT Wet + KOH Prep -     GC/Chlamydia Probe Amp  Trichomonal vaginitis BV (bacterial vaginosis) -     metroNIDAZOLE (FLAGYL) 500 MG tablet; Take 1 tablet (500  mg total) by mouth 2 (two) times daily. -     fluconazole (DIFLUCAN) 150 MG tablet; Take 1 tablet (150 mg total) by mouth once.   Seborrheic dermatitis of scalp- improved, pt got dehydration and her scalp worsened -     hydrocortisone 2.5 % cream; APPLY TOPICALLY TWICE DAILY FOR 4-6 WEEKS -     ketoconazole (NIZORAL) 2 % shampoo; Apply 1 application topically 2 (two) times Robin week.   Discussed with patient that her partner has to get treated as well to prevent sti reinfection      Robin Mora Robin Mora

## 2016-10-11 NOTE — Patient Instructions (Addendum)
   IF you received an x-ray today, you will receive an invoice from [facility]. Please contact [facility] at [phone] with questions or concerns regarding your invoice.   IF you received labwork today, you will receive an invoice from [facility]. Please contact [facility] at [phone] with questions or concerns regarding your invoice.   Our billing staff will not be able to assist you with questions regarding bills from these companies.  You will be contacted with the lab results as soon as they are available. The fastest way to get your results is to activate your My Chart account. Instructions are located on the last page of this paperwork. If you have not heard from us regarding the results in 2 weeks, please contact this office.      Trichomoniasis Trichomoniasis is an STI (sexually transmitted infection) that can affect both women and men. In women, the outer area of the female genitalia (vulva) and the vagina are affected. In men, the penis is mainly affected, but the prostate and other reproductive organs can also be involved. This condition can be treated with medicine. It often has no symptoms (is asymptomatic), especially in men. What are the causes? This condition is caused by an organism called Trichomonas vaginalis. Trichomoniasis most often spreads from person to person (is contagious) through sexual contact. What increases the risk? The following factors may make you more likely to develop this condition:  Having unprotected sexual intercourse.  Having sexual intercourse with a partner who has trichomoniasis.  Having multiple sexual partners.  Having had previous trichomoniasis infections or other STIs.  What are the signs or symptoms? In women, symptoms of trichomoniasis include:  Abnormal vaginal discharge that is clear, white, gray, or yellow-green and foamy and has an unusual "fishy" odor.  Itching and irritation of the vagina and  vulva.  Burning or pain during urination or sexual intercourse.  Genital redness and swelling.  In men, symptoms of trichomoniasis include:  Penile discharge that may be foamy or contain pus.  Pain in the penis. This may happen only when urinating.  Itching or irritation inside the penis.  Burning after urination or ejaculation.  How is this diagnosed? In women, this condition may be found during a routine Pap test or physical exam. It may be found in men during a routine physical exam. Your health care provider may perform tests to help diagnose this infection, such as:  Urine tests (men and women).  The following in women: ? Testing the pH of the vagina. ? A vaginal swab test that checks for the Trichomonas vaginalis organism. ? Testing vaginal secretions.  Your health care provider may test you for other STIs, including HIV (human immunodeficiency virus). How is this treated? This condition is treated with medicine taken by mouth (orally), such as metronidazole or tinidazole to fight the infection. Your sexual partner(s) may also need to be tested and treated.  If you are a woman and you plan to become pregnant or think you may be pregnant, tell your health care provider right away. Some medicines that are used to treat the infection should not be taken during pregnancy.  Your health care provider may recommend over-the-counter medicines or creams to help relieve itching or irritation. You may be tested for infection again 3 months after treatment. Follow these instructions at home:  Take and use over-the-counter and prescription medicines, including creams, only as told by your health care provider.  Do not have sexual intercourse until one week after   you finish your medicine, or until your health care provider approves. Ask your health care provider when you may resume sexual intercourse.  (Women) Do not douche or wear tampons while you have the infection.  Discuss your  infection with your sexual partner(s). Make sure that your partner gets tested and treated, if necessary.  Keep all follow-up visits as told by your health care provider. This is important. How is this prevented?  Use condoms every time you have sex. Using condoms correctly and consistently can help protect against STIs.  Avoid having multiple sexual partners.  Talk with your sexual partner about any symptoms that either of you may have, as well as any history of STIs.  Get tested for STIs and STDs (sexually transmitted diseases) before you have sex. Ask your partner to do the same.  Do not have sexual contact if you have symptoms of trichomoniasis or another STI. Contact a health care provider if:  You still have symptoms after you finish your medicine.  You develop pain in your abdomen.  You have pain when you urinate.  You have bleeding after sexual intercourse.  You develop a rash.  You feel nauseous or you vomit.  You plan to become pregnant or think you may be pregnant. Summary  Trichomoniasis is an STI (sexually transmitted infection) that can affect both women and men.  This condition often has no symptoms (is asymptomatic), especially in men.  You should not have sexual intercourse until one week after you finish your medicine, or until your health care provider approves. Ask your health care provider when you may resume sexual intercourse.  Discuss your infection with your sexual partner. Make sure that your partner gets tested and treated, if necessary. This information is not intended to replace advice given to you by your health care provider. Make sure you discuss any questions you have with your health care provider. Document Released: 07/18/2000 Document Revised: 12/16/2015 Document Reviewed: 12/16/2015 Elsevier Interactive Patient Education  2017 Elsevier Inc.  

## 2016-10-12 LAB — GC/CHLAMYDIA PROBE AMP
Chlamydia trachomatis, NAA: NEGATIVE
NEISSERIA GONORRHOEAE BY PCR: NEGATIVE

## 2016-10-22 IMAGING — CT CT ABD-PELV W/ CM
2 of 7 series · 14 of 46 positions shown, 18 images · IV contrast (ISOVUE)
Comparison: None.

CLINICAL DATA: 10 pound weight loss, nausea/ vomiting/ diarrhea,
bright red blood in stool, colitis flare

EXAM:
CT ABDOMEN AND PELVIS WITH CONTRAST
TECHNIQUE: Multidetector CT imaging of the abdomen and pelvis was performed
using the standard protocol following bolus administration of
intravenous contrast.
CONTRAST:  100mL MM2E12-4VV IOPAMIDOL (MM2E12-4VV) INJECTION 61%

[Series 2: abd/pel with · axial · 0.88mm/px · z∈[+826,+1211]mm · 11 of 89 slices shown, 15 images]
[im 6/89  soft-tissue]
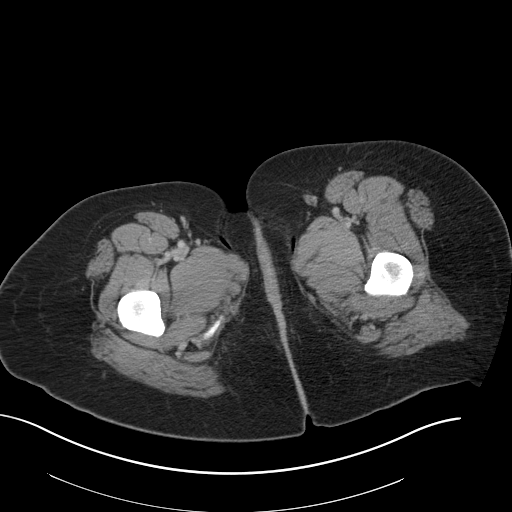
[im 6/89  bone]
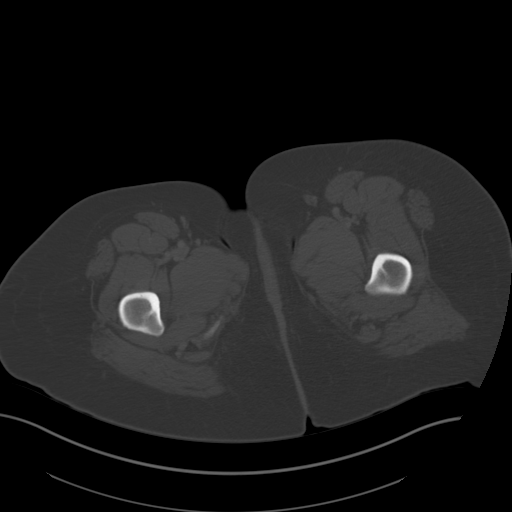
[im 16/89  soft-tissue]
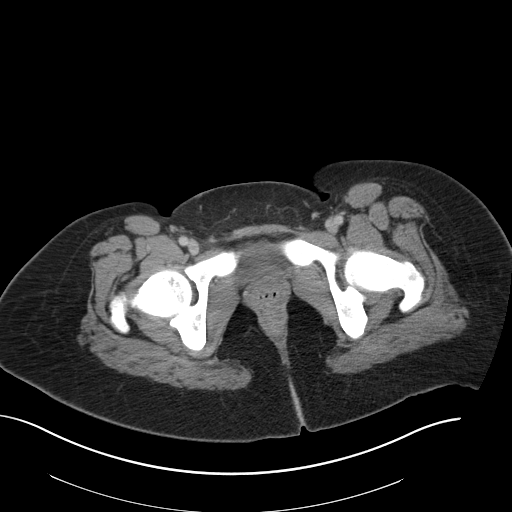
[im 26/89  soft-tissue]
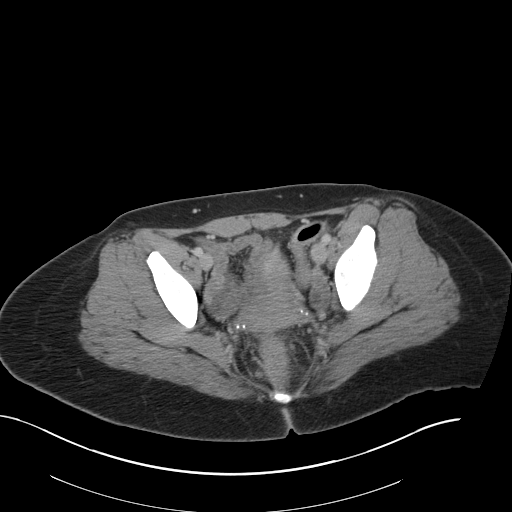
[im 37/89  soft-tissue]
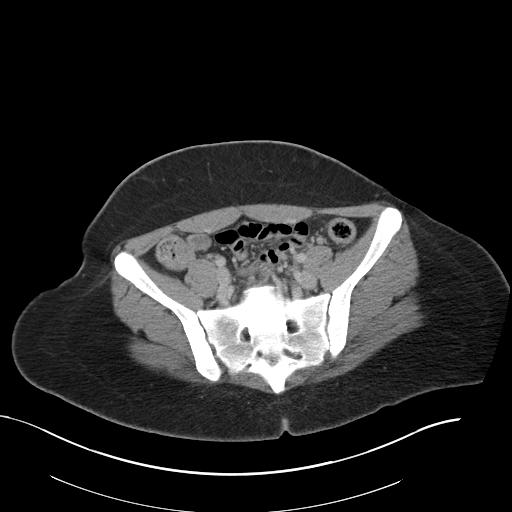
[im 47/89  soft-tissue]
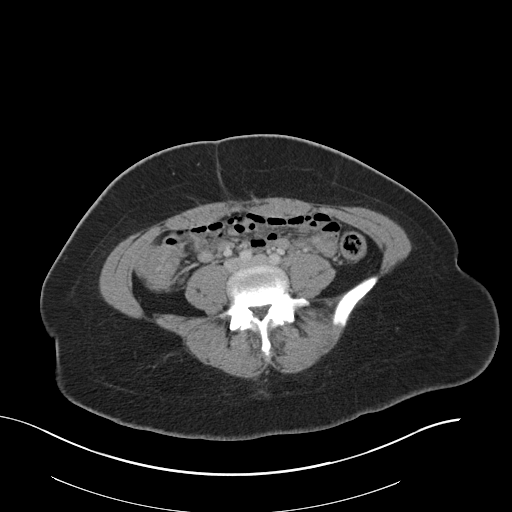
[im 52/89  soft-tissue]
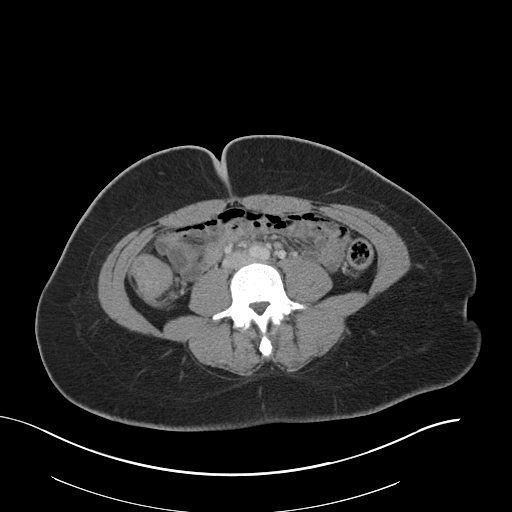
[im 63/89  soft-tissue]
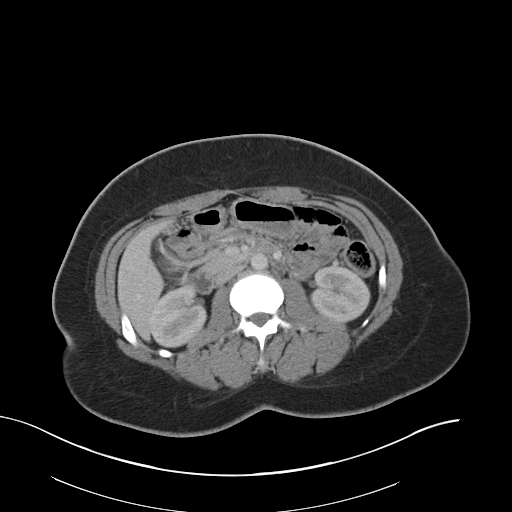
[im 68/89  lung]
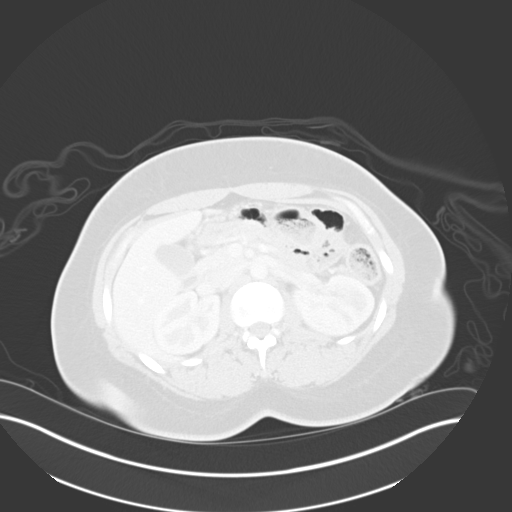
[im 73/89  soft-tissue]
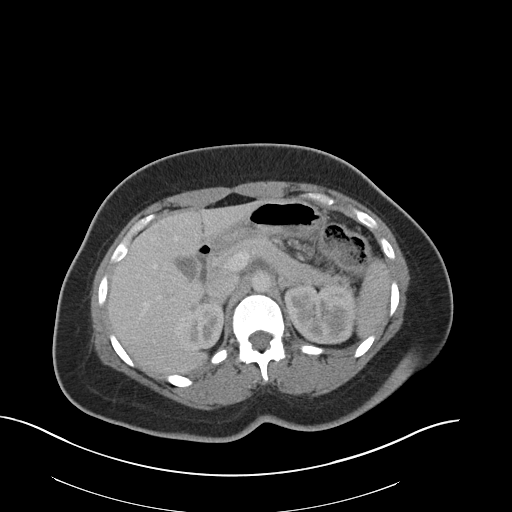
[im 73/89  lung]
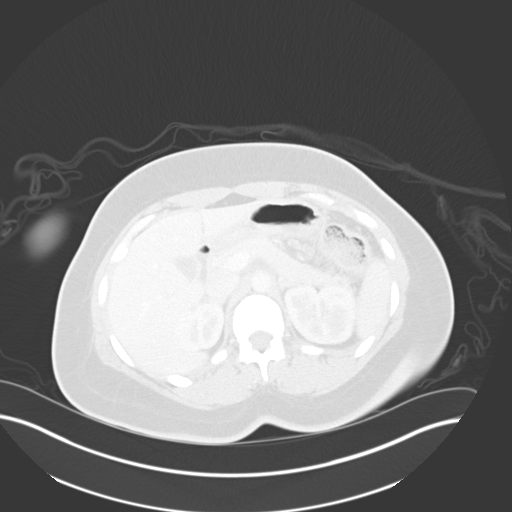
[im 78/89  lung]
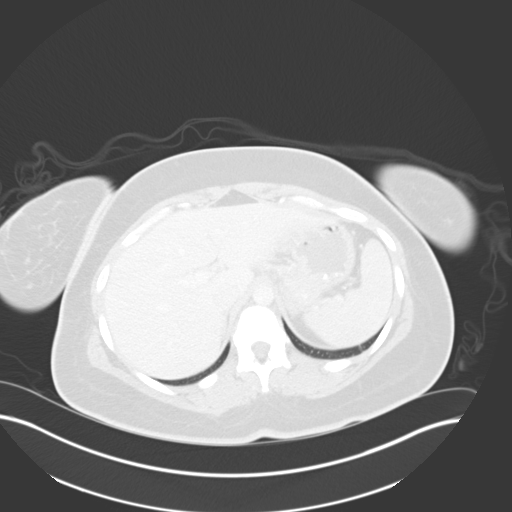
[im 83/89  soft-tissue]
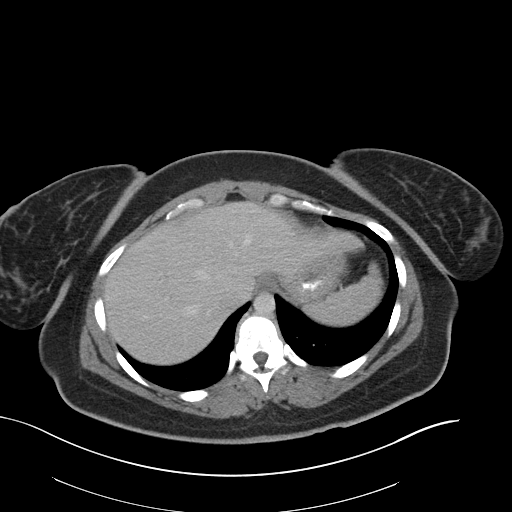
[im 83/89  lung]
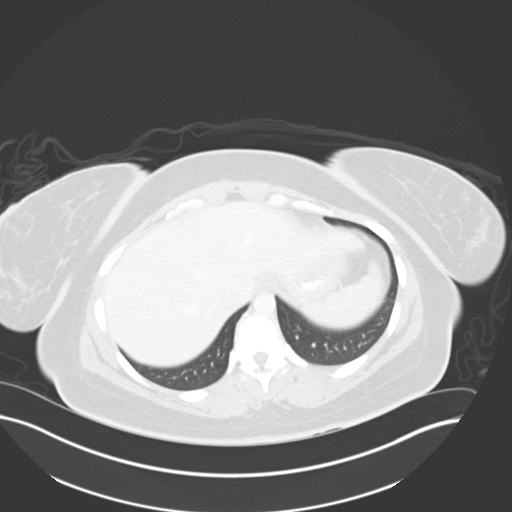
[im 83/89  bone]
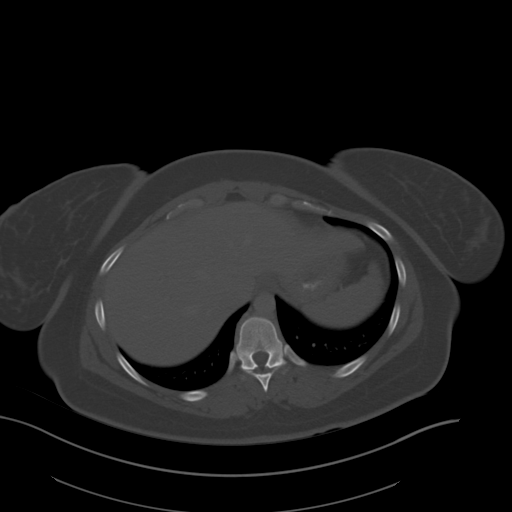

[Series 6: coronal a/|p · coronal · 0.74mm/px · 3 of 133 slices shown]
[im 34/133  soft-tissue]
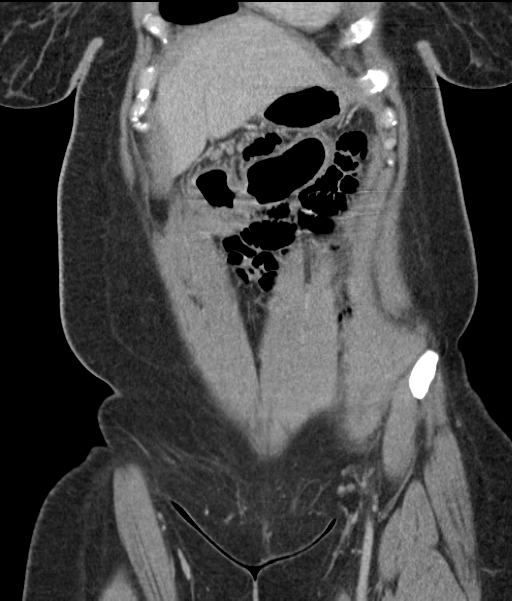
[im 67/133  soft-tissue]
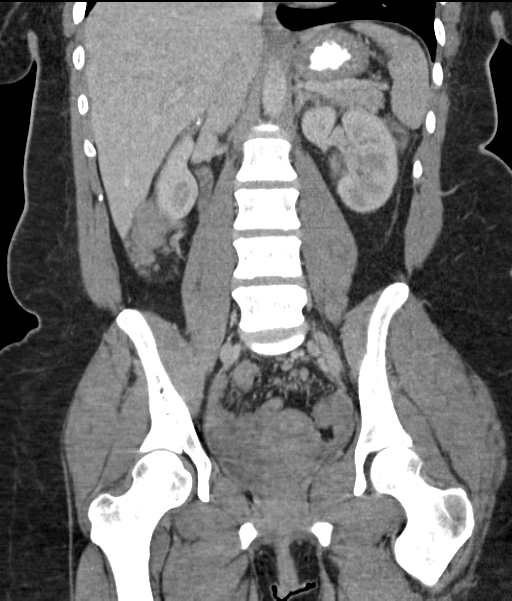
[im 100/133  soft-tissue]
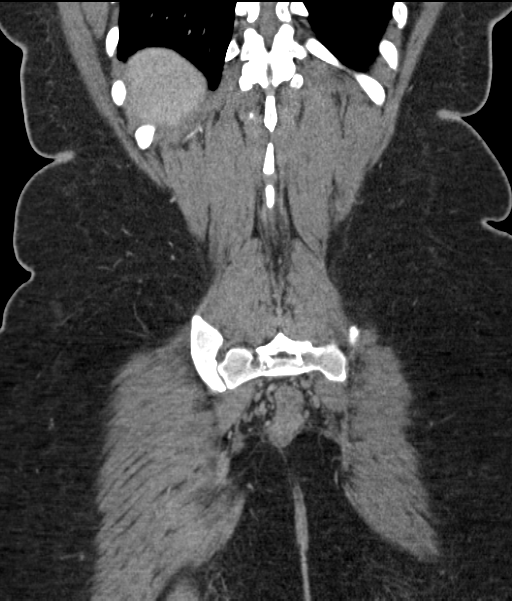

[14 of 46 positions shown; findings below may reference images not displayed]

FINDINGS: Lower chest:  Lung bases are clear.

Hepatobiliary: Liver is within normal limits.

Gallbladder is unremarkable. No intrahepatic or extrahepatic ductal
dilatation.

Pancreas: Within normal limits.

Spleen: Within normal limits.

Adrenals/Urinary Tract: Adrenal glands are within normal limits.

Kidneys are within normal limits.  No hydronephrosis.

Bladder is within normal limits.

Stomach/Bowel: Stomach is within normal limits.

No evidence of bowel obstruction.

Terminal ileum appears unremarkable.

Wall thickening involving the ascending colon (coronal image 58).
Ahaustral/tubular appearance on the descending colon (coronal image
58). Mild wall thickening involving the rectum (series 2/image 60).

Vascular/Lymphatic: No evidence of abdominal aortic aneurysm.

Small ileocolic lymph nodes in the right lower quadrant measuring up
to 7 mm short axis (series 2/ image 44), likely reactive.

No suspicious abdominopelvic lymphadenopathy.

Reproductive: Uterus is within normal limits.

Bilateral ovaries are within normal limits.

Other: No abdominopelvic ascites.

Musculoskeletal: Visualized osseous structures are within normal
limits.
IMPRESSION: Colonic wall thickening/inflammatory changes, predominantly
involving the ascending colon, suggesting infectious or inflammatory
colitis.

## 2016-11-06 ENCOUNTER — Other Ambulatory Visit: Payer: Self-pay | Admitting: Family Medicine

## 2016-11-07 NOTE — Telephone Encounter (Signed)
Please advise 

## 2016-12-13 ENCOUNTER — Encounter: Payer: Self-pay | Admitting: Family Medicine

## 2016-12-13 ENCOUNTER — Ambulatory Visit: Payer: BC Managed Care – PPO | Admitting: Family Medicine

## 2016-12-13 ENCOUNTER — Other Ambulatory Visit: Payer: Self-pay

## 2016-12-13 VITALS — BP 116/86 | HR 71 | Temp 98.6°F | Resp 18 | Ht 65.5 in | Wt 195.6 lb

## 2016-12-13 DIAGNOSIS — N898 Other specified noninflammatory disorders of vagina: Secondary | ICD-10-CM

## 2016-12-13 LAB — POCT WET + KOH PREP
Trich by wet prep: ABSENT
YEAST BY WET PREP: ABSENT
Yeast by KOH: ABSENT

## 2016-12-13 NOTE — Progress Notes (Signed)
Chief Complaint  Patient presents with  . Vaginitis    onset: 10/22 after period, trich dx in past, cottage cheese discharge    HPI  4 review of systems Patient reports that she has a vaginal discharge.  She reports that she has thick discharge after her period ends. Patient's last menstrual period was 11/26/2016. She states that she is nervous because she is worried about stds She states that because she had trichomonas it makes her anxious. She denies foul odor or discharge.    Past Medical History:  Diagnosis Date  . Ulcerative colitis (HCC)     Current Outpatient Medications  Medication Sig Dispense Refill  . balsalazide (COLAZAL) 750 MG capsule Take 2,250 mg by mouth 3 (three) times daily.    . cetirizine (ZYRTEC ALLERGY) 10 MG tablet Take 1 tablet (10 mg total) by mouth daily. 30 tablet 11  . fluticasone (FLONASE) 50 MCG/ACT nasal spray Place 2 sprays into both nostrils daily. (Patient not taking: Reported on 12/13/2016) 16 g 6  . hydrocortisone 2.5 % cream APPLY TOPICALLY TWICE DAILY FOR 4-6 WEEKS (Patient not taking: Reported on 12/13/2016) 28 g 0  . ketoconazole (NIZORAL) 2 % shampoo APPLY 1 APPLICATION TOPICALLY TWICE A WEEK (Patient not taking: Reported on 12/13/2016) 120 mL 6  . metroNIDAZOLE (FLAGYL) 500 MG tablet Take 1 tablet (500 mg total) by mouth 2 (two) times daily. (Patient not taking: Reported on 12/13/2016) 14 tablet 0   No current facility-administered medications for this visit.     Allergies: No Known Allergies  No past surgical history on file.  Social History   Socioeconomic History  . Marital status: Single    Spouse name: n/a  . Number of children: 0  . Years of education: college  . Highest education level: None  Social Needs  . Financial resource strain: None  . Food insecurity - worry: None  . Food insecurity - inability: None  . Transportation needs - medical: None  . Transportation needs - non-medical: None  Occupational History  .  Occupation: Teacher    Comment: [employer]  Tobacco Use  . Smoking status: Never Smoker  . Smokeless tobacco: Never Used  Substance and Sexual Activity  . Alcohol use: No    Alcohol/week: 0.0 oz  . Drug use: No  . Sexual activity: Yes    Partners: Male    Birth control/protection: Condom    Comment: inconsistent condom use  Other Topics Concern  . None  Social History Narrative   In graduate program at [employer] and anticipates a Master's Degree in [place] and African American Literature 06/2015. Her undergraduate degree in [employer] is from [employer].   Lives with her sister.    Family History  Problem Relation Age of Onset  . Hyperlipidemia Father   . Hypertension Father   . Sleep apnea Father   . Diabetes Maternal Grandmother   . Diabetes Paternal Grandmother   . Hypertension Paternal Grandmother   . Heart disease Paternal Grandfather   . Hypertension Paternal Grandfather   . Kidney disease Paternal Grandfather   . Diabetes Paternal Grandfather      ROS Review of Systems See HPI Constitution: No fevers or chills No malaise No diaphoresis Skin: No rash or itching Eyes: no blurry vision, no double vision GU: no dysuria or hematuria Neuro: no dizziness or headaches   Objective: Vitals:   12/13/16 1521  BP: 116/86  Pulse: 71  Resp: 18  Temp: 98.6 F (37 C)  TempSrc:  Oral  SpO2: 95%  Weight: 195 lb 9.6 oz (88.7 kg)  Height: 5' 5.5" (1.664 m)    Physical Exam  Constitutional: She is oriented to person, place, and time. She appears well-developed and well-nourished.  HENT:  Head: Normocephalic and atraumatic.  Eyes: Conjunctivae and EOM are normal.  Pulmonary/Chest: Effort normal.  Neurological: She is alert and oriented to person, place, and time.  Skin: Skin is warm. Capillary refill takes less than 2 seconds.  Psychiatric: She has a normal mood and affect. Her behavior is normal. Judgment and thought content normal.   Pt performed  self-collect wet prep  Lab: no yeast, no trich, no bv   Assessment and Plan Estanislado PandySaadia was seen today for vaginitis.  Diagnoses and all orders for this visit:  Vaginal discharge- physiologic discharge -     POCT Wet + KOH Prep     Keturah Yerby A Loraine Freid

## 2016-12-13 NOTE — Patient Instructions (Addendum)
     IF you received an x-ray today, you will receive an invoice from [facility]. Please contact [facility] at [phone] with questions or concerns regarding your invoice.   IF you received labwork today, you will receive an invoice from [facility]. Please contact [facility] at [phone] with questions or concerns regarding your invoice.   Our billing staff will not be able to assist you with questions regarding bills from these companies.  You will be contacted with the lab results as soon as they are available. The fastest way to get your results is to activate your My Chart account. Instructions are located on the last page of this paperwork. If you have not heard from us regarding the results in 2 weeks, please contact this office.     Vaginitis Vaginitis is an inflammation of the vagina. It can happen when the normal bacteria and yeast in the vagina grow too much. There are different types. Treatment will depend on the type you have. Follow these instructions at home:  Take all medicines as told by your doctor.  Keep your vagina area clean and dry. Avoid soap. Rinse the area with water.  Avoid washing and cleaning out the vagina (douching).  Do not use tampons or have sex (intercourse) until your treatment is done.  Wipe from front to back after going to the restroom.  Wear cotton underwear.  Avoid wearing underwear while you sleep until your vaginitis is gone.  Avoid tight pants. Avoid underwear or nylons without a cotton panel.  Take off wet clothing (such as a bathing suit) as soon as you can.  Use mild, unscented products. Avoid fabric softeners and scented: ? Feminine sprays. ? Laundry detergents. ? Tampons. ? Soaps or bubble baths.  Practice safe sex and use condoms. Get help right away if:  You have belly (abdominal) pain.  You have a fever or lasting symptoms for more than 2-3 days.  You have a fever and your symptoms suddenly get  worse. This information is not intended to replace advice given to you by your health care provider. Make sure you discuss any questions you have with your health care provider. Document Released: 04/20/2008 Document Revised: 06/30/2015 Document Reviewed: 07/05/2011 Elsevier Interactive Patient Education  2017 Elsevier Inc.  

## 2017-01-07 ENCOUNTER — Ambulatory Visit: Payer: Self-pay

## 2017-01-07 NOTE — Telephone Encounter (Signed)
Pt. called to report she had very thick, non-odorous, white-yellow vaginal discharge today x1.  C/o some vaginal itching.  Voiced concern about Trichomonal infection.  Denied any fever or pain.  Per protocol, appt. scheduled 01/08/17 @ [facility].  Care advice given per protocol.  Pt. Verb. Understanding, and agrees with plan.     Reason for Disposition . Abnormal color vaginal discharge (i.e., yellow, green, gray)  Answer Assessment - Initial Assessment Questions 1. DISCHARGE: "Describe the discharge." (e.g., white, yellow, green, gray, foamy, cottage cheese-like)     White to yellow, approx. 1/2 tbsp. 2. ODOR: "Is there a bad odor?"     No odor 3. ONSET: "When did the discharge begin?"     Today  4. RASH: "Is there a rash in that area?" If so, ask: "Describe it." (e.g., redness, blisters, sores, bumps)     No vaginal soreness; no open areas, no rash 5. ABDOMINAL PAIN: "Are you having any abdominal pain?" If yes: "What does it feel like? " (e.g., crampy, dull, intermittent, constant)      no 6. ABDOMINAL PAIN SEVERITY: If present, ask: "How bad is it?"  (e.g., mild, moderate, severe)  - MILD - doesn't interfere with normal activities   - MODERATE - interferes with normal activities or awakens from sleep   - SEVERE - patient doesn't want to move (R/O peritonitis)      n/a 7. CAUSE: "What do you think is causing the discharge?" "Have you had the same problem before? What happened then?"     Yes, had discharge like this in the past; no treatment indicated; was neg for yeast or Trich.  8. OTHER SYMPTOMS: "Do you have any other symptoms?" (e.g., fever, itching, vaginal bleeding, pain with urination, injury to genital area, vaginal foreign body)    Slight vaginal itching; denies pain with urination; no bleeding.  9. PREGNANCY: "Is there any chance you are pregnant?" "When was your last menstrual period?"     No; LMP was 2.5 weeks ago.  Protocols used: VAGINAL DISCHARGE-A-AH

## 2017-01-08 ENCOUNTER — Encounter: Payer: Self-pay | Admitting: Physician Assistant

## 2017-01-08 ENCOUNTER — Ambulatory Visit: Payer: BC Managed Care – PPO | Admitting: Physician Assistant

## 2017-01-08 ENCOUNTER — Other Ambulatory Visit: Payer: Self-pay

## 2017-01-08 VITALS — BP 118/78 | HR 79 | Temp 98.0°F | Resp 16 | Ht 64.96 in | Wt 188.0 lb

## 2017-01-08 DIAGNOSIS — Z124 Encounter for screening for malignant neoplasm of cervix: Secondary | ICD-10-CM

## 2017-01-08 DIAGNOSIS — N898 Other specified noninflammatory disorders of vagina: Secondary | ICD-10-CM

## 2017-01-08 DIAGNOSIS — Z3009 Encounter for other general counseling and advice on contraception: Secondary | ICD-10-CM

## 2017-01-08 DIAGNOSIS — B9689 Other specified bacterial agents as the cause of diseases classified elsewhere: Secondary | ICD-10-CM | POA: Diagnosis not present

## 2017-01-08 DIAGNOSIS — N760 Acute vaginitis: Secondary | ICD-10-CM | POA: Diagnosis not present

## 2017-01-08 LAB — POCT WET + KOH PREP
TRICH BY WET PREP: ABSENT
Yeast by KOH: ABSENT
Yeast by wet prep: ABSENT

## 2017-01-08 LAB — POCT URINE PREGNANCY: PREG TEST UR: NEGATIVE

## 2017-01-08 MED ORDER — NORGESTIMATE-ETH ESTRADIOL 0.25-35 MG-MCG PO TABS: 1.0000 | ORAL_TABLET | Freq: Every day | ORAL | 4 refills | Status: DC

## 2017-01-08 MED ORDER — METRONIDAZOLE 500 MG PO TABS: 500.0000 mg | ORAL_TABLET | Freq: Two times a day (BID) | ORAL | 0 refills | Status: AC

## 2017-01-08 NOTE — Progress Notes (Signed)
Chief Complaint  Patient presents with  . Vaginal Discharge    pt states she has been having discharge for a while and just want to make sure she does not have an std.    Subjective:    Patient ID: Weston AnnaSaadia Labombard, female    DOB: 08/15/1990, 26 y.o.   MRN: [id number]  HPI Ms. Cornelius MorasOwen is a 26 year old African American female who presents for evaluation of vaginal discharge.  Patient noticed vaginal discharge in her underwear yesterday. Discharge was yellow-white. No odor. No vaginal bleeding. Patient is sexually active with a female partner. She rarely uses condoms. No concerns that her partner has an STD or that he has other partners. No dysuria. Last menstrual cycle was 14th of November.  Review of Systems  Constitutional: Positive for fatigue (today only). Negative for appetite change, fever and unexpected weight change.  Respiratory: Negative for shortness of breath.   Cardiovascular: Negative for chest pain.  Gastrointestinal: Positive for constipation (taking more fiber and water helps) and nausea (after taking Balsalazide). Negative for abdominal pain, diarrhea and vomiting.  Genitourinary: Positive for vaginal discharge. Negative for dyspareunia, dysuria, frequency, vaginal bleeding and vaginal pain.   Patient Active Problem List   Diagnosis Date Noted  . Ulcerative colitis (HCC) 06/30/2016  . N&V (nausea and vomiting) 06/30/2016  . Anemia 06/30/2016  . Hypokalemia 06/30/2016  . Ulcerative colitis, acute, with rectal bleeding (HCC) 06/30/2016  . Colitis 06/14/2015  . Large breasts 05/07/2013  . Obesity, unspecified 05/07/2013   Past Medical History:  Diagnosis Date  . Ulcerative colitis (HCC)    History reviewed. No pertinent surgical history.   Current Outpatient Medications on File Prior to Visit  Medication Sig Dispense Refill  . balsalazide (COLAZAL) 750 MG capsule Take 2,250 mg by mouth 3 (three) times daily.    . cetirizine (ZYRTEC ALLERGY) 10 MG tablet Take 1 tablet (10  mg total) by mouth daily. 30 tablet 11  . hydrocortisone 2.5 % cream APPLY TOPICALLY TWICE DAILY FOR 4-6 WEEKS 28 g 0  . ketoconazole (NIZORAL) 2 % shampoo APPLY 1 APPLICATION TOPICALLY TWICE A WEEK 120 mL 6   No current facility-administered medications on file prior to visit.    No Known Allergies   Social History   Socioeconomic History  . Marital status: Single    Spouse name: n/a  . Number of children: 0  . Years of education: college  . Highest education level: Not on file  Social Needs  . Financial resource strain: Not on file  . Food insecurity - worry: Not on file  . Food insecurity - inability: Not on file  . Transportation needs - medical: Not on file  . Transportation needs - non-medical: Not on file  Occupational History  . Occupation: Teacher    Comment: [employer]  Tobacco Use  . Smoking status: Never Smoker  . Smokeless tobacco: Never Used  Substance and Sexual Activity  . Alcohol use: No    Alcohol/week: 0.0 oz  . Drug use: No  . Sexual activity: Yes    Partners: Male    Birth control/protection: Condom    Comment: inconsistent condom use  Other Topics Concern  . Not on file  Social History Narrative   In graduate program at [employer] and anticipates a Master's Degree in [place] and [place] American Literature 06/2015. Her undergraduate degree in [employer] is from [employer].   Lives with her sister.      Objective:  Vitals:   01/08/17  1647  BP: 118/78  Pulse: 79  Resp: 16  Temp: 98 F (36.7 C)  SpO2: 100%     Physical Exam  Constitutional: She appears well-developed and well-nourished. No distress.  HENT:  Head: Normocephalic and atraumatic.  Right Ear: External ear normal.  Left Ear: External ear normal.  Mouth/Throat: Oropharynx is clear and moist.  Eyes: Conjunctivae are normal.  Neck: Neck supple.  Cardiovascular: Normal rate, regular rhythm, normal heart sounds and intact distal pulses.  Pulmonary/Chest: Effort normal  and breath sounds normal.  Abdominal: Soft. Bowel sounds are normal. There is no tenderness.  Lymphadenopathy:    She has no cervical adenopathy.  Neurological: She is alert.  Skin: Skin is warm.  Psychiatric: She has a normal mood and affect.      Assessment & Plan:   1. Vaginal discharge Counseled patient on safe sex practices.  - POCT Wet + KOH Prep - Hepatitis B surface antibody - Hepatitis B surface antigen - Hepatitis C antibody - HIV antibody - RPR - POCT urine pregnancy - Pap IG, CT/NG w/ reflex HPV when ASC-U  2. Screening for cervical cancer Patient has already completed HPV vaccine series - Pap IG, CT/NG w/ reflex HPV when ASC-U  3. Encounter for other general counseling or advice on contraception Discussed safe sex practices. Patient used to use Depo for contraception but stopped it due to significant weight gain. Discussed new medication regimen as below. - POCT urine pregnancy - norgestimate-ethinyl estradiol (ORTHO-CYCLEN,SPRINTEC,PREVIFEM) 0.25-35 MG-MCG tablet; Take 1 tablet by mouth daily.  Dispense: 3 Package; Refill: 4  Willett Lefeber [address], [address]

## 2017-01-08 NOTE — Patient Instructions (Addendum)
Please consider starting the oral contraceptive pills to prevent an unplanned pregnancy. Please use condoms to prevent sexually transmitted infections, and as a back up contraceptive for the first pack of pills.  If you choose NOT to actively and consistently prevent pregnancy, please start a daily vitamin containing FOLIC ACID.    IF you received an x-ray today, you will receive an invoice from [facility]. Please contact [facility] at [phone] with questions or concerns regarding your invoice.   IF you received labwork today, you will receive an invoice from [facility]. Please contact [facility] at [phone] with questions or concerns regarding your invoice.   Our billing staff will not be able to assist you with questions regarding bills from these companies.  You will be contacted with the lab results as soon as they are available. The fastest way to get your results is to activate your My Chart account. Instructions are located on the last page of this paperwork. If you have not heard from us regarding the results in 2 weeks, please contact this office.

## 2017-01-08 NOTE — Progress Notes (Signed)
Patient ID: Robin Mora, female     DOB: 12/02/1990, 26 y.o.    MRN: [id number]  PCP: [name], Zoe A, MD  Chief Complaint  Patient presents with  . Vaginal Discharge    pt states she has been having discharge for a while and just want to make sure she does not have an std.     Subjective:   This patient is new to me and presents for evaluation of vaginal discharge and STI screening.  History of trichomonas. Seen for same 12/13/2016 (negative wet prep, no treatment needed), 10/11/2016 (wet prep revealed and was treated for BV). One female partner. Rare condom use.  No dysuria. No increased urinary urgency, frequency. No fever, chills. No diarrhea. No swollen lymph nodes. No new rash.  Review of Systems Constitutional: Positive for fatigue (today only). Negative for appetite change, fever and unexpected weight change.  Respiratory: Negative for shortness of breath.   Cardiovascular: Negative for chest pain.  Gastrointestinal: Positive for constipation (taking more fiber and water helps) and nausea (after taking Balsalazide). Negative for abdominal pain, diarrhea and vomiting.  Genitourinary: Positive for vaginal discharge. Negative for dyspareunia, dysuria, frequency, vaginal bleeding and vaginal pain.     Prior to Admission medications   Medication Sig Start Date End Date Taking? Authorizing Provider  balsalazide (COLAZAL) 750 MG capsule Take 2,250 mg by mouth 3 (three) times daily.   Yes [provider]  cetirizine (ZYRTEC ALLERGY) 10 MG tablet Take 1 tablet (10 mg total) by mouth daily. 04/20/16  Yes Stallings, Zoe A, MD  hydrocortisone 2.5 % cream APPLY TOPICALLY TWICE DAILY FOR 4-6 WEEKS 10/11/16  Yes Stallings, Zoe A, MD  ketoconazole (NIZORAL) 2 % shampoo APPLY 1 APPLICATION TOPICALLY TWICE A WEEK 11/07/16  Yes [name], Zoe A, MD     No Known Allergies   Patient Active Problem List   Diagnosis Date Noted  . Ulcerative colitis (HCC) 06/30/2016  . N&V  (nausea and vomiting) 06/30/2016  . Anemia 06/30/2016  . Hypokalemia 06/30/2016  . Ulcerative colitis, acute, with rectal bleeding (HCC) 06/30/2016  . Colitis 06/14/2015  . Large breasts 05/07/2013  . Obesity, unspecified 05/07/2013     Family History  Problem Relation Age of Onset  . Hyperlipidemia Father   . Hypertension Father   . Sleep apnea Father   . Diabetes Maternal Grandmother   . Diabetes Paternal Grandmother   . Hypertension Paternal Grandmother   . Heart disease Paternal Grandfather   . Hypertension Paternal Grandfather   . Kidney disease Paternal Grandfather   . Diabetes Paternal Grandfather      Social History   Socioeconomic History  . Marital status: Single    Spouse name: n/a  . Number of children: 0  . Years of education: college  . Highest education level: Not on file  Social Needs  . Financial resource strain: Not on file  . Food insecurity - worry: Not on file  . Food insecurity - inability: Not on file  . Transportation needs - medical: Not on file  . Transportation needs - non-medical: Not on file  Occupational History  . Occupation: Teacher    Comment: [employer]  Tobacco Use  . Smoking status: Never Smoker  . Smokeless tobacco: Never Used  Substance and Sexual Activity  . Alcohol use: No    Alcohol/week: 0.0 oz  . Drug use: No  . Sexual activity: Yes    Partners: Male    Birth control/protection: Condom  Comment: inconsistent condom use  Other Topics Concern  . Not on file  Social History Narrative   In graduate program at [employer] and anticipates a Master's Degree in [place]English and [place]African American Literature 06/2015. Her undergraduate degree in [employer] is from [employer].   Lives with her sister.         Objective:  Physical Exam  Constitutional: She is oriented to person, place, and time. She appears well-developed and well-nourished. She is active and cooperative. No distress.  BP 118/78   Pulse 79   Temp 98  F (36.7 C) (Oral)   Resp 16   Ht 5' 4.96" (1.65 m)   Wt 188 lb (85.3 kg)   LMP 12/18/2016 (Approximate)   SpO2 100%   BMI 31.32 kg/m    Eyes: Conjunctivae are normal.  Pulmonary/Chest: Effort normal.  Abdominal: Hernia confirmed negative in the right inguinal area and confirmed negative in the left inguinal area.  Genitourinary: Uterus normal. No labial fusion. There is no rash, tenderness, lesion or injury on the right labia. There is no rash, tenderness, lesion or injury on the left labia. Cervix exhibits no motion tenderness, no discharge and no friability. Right adnexum displays no mass, no tenderness and no fullness. Left adnexum displays no mass, no tenderness and no fullness. No erythema, tenderness or bleeding in the vagina. No foreign body in the vagina. No signs of injury around the vagina. Vaginal discharge found.  Lymphadenopathy:       Right: No inguinal adenopathy present.       Left: No inguinal adenopathy present.  Neurological: She is alert and oriented to person, place, and time.  Psychiatric: She has a normal mood and affect. Her speech is normal and behavior is normal.     Results for orders placed or performed in visit on 01/08/17  POCT Wet + KOH Prep  Result Value Ref Range   Yeast by KOH Absent Absent   Yeast by wet prep Absent Absent   WBC by wet prep Moderate (A) Few   Clue Cells Wet Prep HPF POC Moderate (A) None   Trich by wet prep Absent Absent   Bacteria Wet Prep HPF POC Moderate (A) Few   Epithelial Cells By Principal FinancialWet Pref (UMFC) Many (A) None, Few, Too numerous to count   RBC,UR,HPF,POC None None RBC/hpf  POCT urine pregnancy  Result Value Ref Range   Preg Test, Ur Negative Negative       Assessment & Plan:  1. Vaginal discharge Due to BV - POCT Wet + KOH Prep - Hepatitis B surface antibody - Hepatitis B surface antigen - Hepatitis C antibody - HIV antibody - RPR - POCT urine pregnancy - Pap IG, CT/NG w/ reflex HPV when ASC-U  2. Screening  for cervical cancer Await results. If normal, repeat in 3 years. - Pap IG, CT/NG w/ reflex HPV when ASC-U  3. Encounter for other general counseling or advice on contraception Does not desire pregnancy. - POCT urine pregnancy - norgestimate-ethinyl estradiol (ORTHO-CYCLEN,SPRINTEC,PREVIFEM) 0.25-35 MG-MCG tablet; Take 1 tablet by mouth daily.  Dispense: 3 Package; Refill: 4  4. BV (bacterial vaginosis) Anticipatory guidance provided. - metroNIDAZOLE (FLAGYL) 500 MG tablet; Take 1 tablet (500 mg total) by mouth 2 (two) times daily with a meal for 7 days. DO NOT CONSUME ALCOHOL WHILE TAKING THIS MEDICATION.  Dispense: 14 tablet; Refill: 0    Return if symptoms worsen or fail to improve.   Fernande Brashelle S. Tellis Spivak, PA-C Primary Care at [facility]

## 2017-01-09 ENCOUNTER — Telehealth: Payer: Self-pay | Admitting: Family Medicine

## 2017-01-09 LAB — PAP IG, CT-NG, RFX HPV ASCU
CHLAMYDIA, NUC. ACID AMP: NEGATIVE
Gonococcus by Nucleic Acid Amp: NEGATIVE
PAP SMEAR COMMENT: 0

## 2017-01-09 LAB — HEPATITIS B SURFACE ANTIBODY, QUANTITATIVE: HEPATITIS B SURF AB QUANT: 19.4 m[IU]/mL

## 2017-01-09 LAB — HIV ANTIBODY (ROUTINE TESTING W REFLEX): HIV SCREEN 4TH GENERATION: NONREACTIVE

## 2017-01-09 LAB — HEPATITIS B SURFACE ANTIGEN: HEP B S AG: NEGATIVE

## 2017-01-09 LAB — RPR: RPR Ser Ql: NONREACTIVE

## 2017-01-09 LAB — HEPATITIS C ANTIBODY: Hep C Virus Ab: <0.1 s/co ratio (ref 0.0–0.9)

## 2017-01-09 NOTE — Telephone Encounter (Signed)
Called pharmacy and corrected per Chelle to patient only taking medication for 7 days.

## 2017-01-09 NOTE — Telephone Encounter (Signed)
[facility] needing clarification on the Metronidazole order.  Not sure of the quantity needed or the number of days needed.   It doesn't match.   The quantity and the number of days does not correlate.

## 2017-02-22 ENCOUNTER — Ambulatory Visit: Payer: BC Managed Care – PPO | Admitting: Physician Assistant

## 2017-02-22 ENCOUNTER — Encounter: Payer: Self-pay | Admitting: Physician Assistant

## 2017-02-22 VITALS — BP 110/74 | HR 86 | Temp 98.3°F | Resp 16 | Ht 64.0 in | Wt 185.6 lb

## 2017-02-22 DIAGNOSIS — B373 Candidiasis of vulva and vagina: Secondary | ICD-10-CM

## 2017-02-22 DIAGNOSIS — N760 Acute vaginitis: Secondary | ICD-10-CM | POA: Diagnosis not present

## 2017-02-22 DIAGNOSIS — B9689 Other specified bacterial agents as the cause of diseases classified elsewhere: Secondary | ICD-10-CM | POA: Diagnosis not present

## 2017-02-22 DIAGNOSIS — N898 Other specified noninflammatory disorders of vagina: Secondary | ICD-10-CM

## 2017-02-22 DIAGNOSIS — B3731 Acute candidiasis of vulva and vagina: Secondary | ICD-10-CM

## 2017-02-22 LAB — POCT WET + KOH PREP
Trich by wet prep: ABSENT
Yeast by KOH: ABSENT
Yeast by wet prep: ABSENT

## 2017-02-22 MED ORDER — METRONIDAZOLE 500 MG PO TABS: 500.0000 mg | ORAL_TABLET | Freq: Two times a day (BID) | ORAL | 0 refills | Status: DC

## 2017-02-22 MED ORDER — FLUCONAZOLE 150 MG PO TABS: 150.0000 mg | ORAL_TABLET | Freq: Once | ORAL | 0 refills | Status: AC

## 2017-02-22 NOTE — Progress Notes (Signed)
Robin Mora  MRN: [id number] DOB: 28-Apr-1990  PCP: Doristine Bosworth, MD  Subjective:  Pt is a 27 year old female PMH colitis, obesity and anemia who presents to clinic for vaginal discharge x 1 week. Discharge is thick and white with odor.   She has been here 5 times in the past six months for similar problem. She has been treated for trichomonas, and BV in th past. She feels like she is getting BV infections after every period.  She is not concerned today for STDs today. declines testing.   She denies urinary symptoms, flank pain, vaginal pain, pain with sex, fever, chills, abdominal pain, missed period.   UTD HPV.  Rx for sprintec 01/2017. She is not taking this. "I do not want birth control". She is using condoms "and other methods".   Last PAP 01/2017 negative.  Last month she tested negative for HIV, RPR, hep C, hep B.  H/o ulcerative colitis. Dx 2017. Dr. Noe Gens at [facility]. Next appt is next week. Plans to discuss possible side effects of Colazal.   Review of Systems  Constitutional: Negative for chills, fatigue and fever.  Gastrointestinal: Negative for abdominal pain, diarrhea, nausea and vomiting.  Genitourinary: Positive for vaginal discharge. Negative for decreased urine volume, difficulty urinating, dysuria, enuresis, flank pain, frequency, hematuria, urgency, vaginal bleeding and vaginal pain.  Musculoskeletal: Negative for back pain.  Neurological: Negative for dizziness, weakness, light-headedness and headaches.    Patient Active Problem List   Diagnosis Date Noted  . Ulcerative colitis (HCC) 06/30/2016  . N&V (nausea and vomiting) 06/30/2016  . Anemia 06/30/2016  . Hypokalemia 06/30/2016  . Ulcerative colitis, acute, with rectal bleeding (HCC) 06/30/2016  . Colitis 06/14/2015  . Large breasts 05/07/2013  . Obesity, unspecified 05/07/2013    Current Outpatient Medications on File Prior to Visit  Medication Sig Dispense Refill  . balsalazide (COLAZAL)  750 MG capsule Take 2,250 mg by mouth 3 (three) times daily.    . cetirizine (ZYRTEC ALLERGY) 10 MG tablet Take 1 tablet (10 mg total) by mouth daily. 30 tablet 11  . hydrocortisone 2.5 % cream APPLY TOPICALLY TWICE DAILY FOR 4-6 WEEKS 28 g 0  . ketoconazole (NIZORAL) 2 % shampoo APPLY 1 APPLICATION TOPICALLY TWICE A WEEK (Patient not taking: Reported on 02/22/2017) 120 mL 6  . norgestimate-ethinyl estradiol (ORTHO-CYCLEN,SPRINTEC,PREVIFEM) 0.25-35 MG-MCG tablet Take 1 tablet by mouth daily. (Patient not taking: Reported on 02/22/2017) 3 Package 4   No current facility-administered medications on file prior to visit.     No Known Allergies   Objective:  BP 110/74   Pulse 86   Temp 98.3 F (36.8 C) (Oral)   Resp 16   Ht 5\' 4"  (1.626 m)   Wt 185 lb 9.6 oz (84.2 kg)   LMP 02/09/2017   SpO2 100%   BMI 31.86 kg/m   Physical Exam  Constitutional: She is oriented to person, place, and time and well-developed, well-nourished, and in no distress. No distress.  Genitourinary: Cervix normal, right adnexa normal, left adnexa normal and vulva normal. Thick  white and vaginal discharge found.  Neurological: She is alert and oriented to person, place, and time. GCS score is 15.  Skin: Skin is warm and dry.  Psychiatric: Mood, memory, affect and judgment normal.  Vitals reviewed.  Results for orders placed or performed in visit on 02/22/17  POCT Wet + KOH Prep  Result Value Ref Range   Yeast by KOH Absent Absent   Yeast by  wet prep Absent Absent   WBC by wet prep None (A) Few   Clue Cells Wet Prep HPF POC Few (A) None   Trich by wet prep Absent Absent   Bacteria Wet Prep HPF POC Many (A) Few   Epithelial Cells By Principal FinancialWet Pref (UMFC) Few None, Few, Too numerous to count   RBC,UR,HPF,POC None None RBC/hpf    Assessment and Plan :  1. BV (bacterial vaginosis) 2. Vaginal discharge - metroNIDAZOLE (FLAGYL) 500 MG tablet; Take 1 tablet (500 mg total) by mouth 2 (two) times daily with a meal. DO  NOT CONSUME ALCOHOL WHILE TAKING THIS MEDICATION.  Dispense: 14 tablet; Refill: 0 - POCT Wet + KOH Prep - Pt c/o vaginal discharge and odor x 1 week. KOH +clue cells. Plan to treat for BV. She declines STD testing today. Pt has been treated three times in the past six months for BV. Consider treatment for recurrent infection if this happens again. She has used boric acid supp in the past, cannot recall outcome of treatment. RTC if symptoms return.   3. Yeast vaginitis - fluconazole (DIFLUCAN) 150 MG tablet; Take 1 tablet (150 mg total) by mouth once for 1 dose. Repeat if needed  Dispense: 2 tablet; Refill: 0 - Pt endorses yeast infections following antibiotic treatment.   Marco CollieWhitney Kashaun Bebo, PA-C  Primary Care at [facility] 02/22/2017 2:14 PM

## 2017-02-22 NOTE — Patient Instructions (Addendum)
You are positive for BV.  Continue to practice safe sex.  Remember to ask Dr. Ferdinand Lango about side effects of your UC medications.    Bacterial Vaginosis Bacterial vaginosis is a vaginal infection that occurs when the normal balance of bacteria in the vagina is disrupted. It results from an overgrowth of certain bacteria. This is the most common vaginal infection among women ages 20-44. Because bacterial vaginosis increases your risk for STIs (sexually transmitted infections), getting treated can help reduce your risk for chlamydia, gonorrhea, herpes, and HIV (human immunodeficiency virus). Treatment is also important for preventing complications in pregnant women, because this condition can cause an early (premature) delivery. What are the causes? This condition is caused by an increase in harmful bacteria that are normally present in small amounts in the vagina. However, the reason that the condition develops is not fully understood. What increases the risk? The following factors may make you more likely to develop this condition:  Having a new sexual partner or multiple sexual partners.  Having unprotected sex.  Douching.  Having an intrauterine device (IUD).  Smoking.  Drug and alcohol abuse.  Taking certain antibiotic medicines.  Being pregnant.  You cannot get bacterial vaginosis from toilet seats, bedding, swimming pools, or contact with objects around you. What are the signs or symptoms? Symptoms of this condition include:  Grey or white vaginal discharge. The discharge can also be watery or foamy.  A fish-like odor with discharge, especially after sexual intercourse or during menstruation.  Itching in and around the vagina.  Burning or pain with urination.  Some women with bacterial vaginosis have no signs or symptoms. How is this diagnosed? This condition is diagnosed based on:  Your medical history.  A physical exam of the vagina.  Testing a sample of  vaginal fluid under a microscope to look for a large amount of bad bacteria or abnormal cells. Your health care provider may use a cotton swab or a small wooden spatula to collect the sample.  How is this treated? This condition is treated with antibiotics. These may be given as a pill, a vaginal cream, or a medicine that is put into the vagina (suppository). If the condition comes back after treatment, a second round of antibiotics may be needed. Follow these instructions at home: Medicines  Take over-the-counter and prescription medicines only as told by your health care provider.  Take or use your antibiotic as told by your health care provider. Do not stop taking or using the antibiotic even if you start to feel better. General instructions  If you have a female sexual partner, tell her that you have a vaginal infection. She should see her health care provider and be treated if she has symptoms. If you have a female sexual partner, he does not need treatment.  During treatment: ? Avoid sexual activity until you finish treatment. ? Do not douche. ? Avoid alcohol as directed by your health care provider. ? Avoid breastfeeding as directed by your health care provider.  Drink enough water and fluids to keep your urine clear or pale yellow.  Keep the area around your vagina and rectum clean. ? Wash the area daily with warm water. ? Wipe yourself from front to back after using the toilet.  Keep all follow-up visits as told by your health care provider. This is important. How is this prevented?  Do not douche.  Wash the outside of your vagina with warm water only.  Use protection when having sex. This  includes latex condoms and dental dams.  Limit how many sexual partners you have. To help prevent bacterial vaginosis, it is best to have sex with just one partner (monogamous).  Make sure you and your sexual partner are tested for STIs.  Wear cotton or cotton-lined underwear.  Avoid  wearing tight pants and pantyhose, especially during summer.  Limit the amount of alcohol that you drink.  Do not use any products that contain nicotine or tobacco, such as cigarettes and e-cigarettes. If you need help quitting, ask your health care provider.  Do not use illegal drugs. Where to find more information:  Centers for Disease Control and Prevention: AppraiserFraud.fi  American Sexual Health Association (ASHA): www.ashastd.org  U.S. Department of Health and Financial controller, Office on Women's Health: DustingSprays.pl or SecuritiesCard.it Contact a health care provider if:  Your symptoms do not improve, even after treatment.  You have more discharge or pain when urinating.  You have a fever.  You have pain in your abdomen.  You have pain during sex.  You have vaginal bleeding between periods. Summary  Bacterial vaginosis is a vaginal infection that occurs when the normal balance of bacteria in the vagina is disrupted.  Because bacterial vaginosis increases your risk for STIs (sexually transmitted infections), getting treated can help reduce your risk for chlamydia, gonorrhea, herpes, and HIV (human immunodeficiency virus). Treatment is also important for preventing complications in pregnant women, because the condition can cause an early (premature) delivery.  This condition is treated with antibiotic medicines. These may be given as a pill, a vaginal cream, or a medicine that is put into the vagina (suppository). This information is not intended to replace advice given to you by your health care provider. Make sure you discuss any questions you have with your health care provider. Document Released: 01/22/2005 Document Revised: 05/28/2016 Document Reviewed: 10/08/2015 Elsevier Interactive Patient Education  [employer].  Thank you for coming in today. I hope you feel we met your needs.  Feel free to call PCP if you  have any questions or further requests.  Please consider signing up for MyChart if you do not already have it, as this is a great way to communicate with me.  Best,  Whitney McVey, PA-C  IF you received an x-ray today, you will receive an invoice from [facility]. Please contact [facility] at [phone] with questions or concerns regarding your invoice.   IF you received labwork today, you will receive an invoice from [facility]. Please contact [facility] at [phone] with questions or concerns regarding your invoice.   Our billing staff will not be able to assist you with questions regarding bills from these companies.  You will be contacted with the lab results as soon as they are available. The fastest way to get your results is to activate your My Chart account. Instructions are located on the last page of this paperwork. If you have not heard from [place] regarding the results in 2 weeks, please contact this office.

## 2017-04-28 ENCOUNTER — Other Ambulatory Visit: Payer: Self-pay | Admitting: Family Medicine

## 2017-11-13 ENCOUNTER — Telehealth: Payer: Self-pay | Admitting: Family Medicine

## 2017-11-13 DIAGNOSIS — Z113 Encounter for screening for infections with a predominantly sexual mode of transmission: Secondary | ICD-10-CM

## 2017-11-13 NOTE — Telephone Encounter (Signed)
Copied from CRM [id number]. Topic: Referral - Request >> Nov 13, 2017 12:13 PM Laural Benes, [place] C wrote: Reason for CRM:    Pt would like to know if provider could refer her to a OBGYN . Pt would like to have STD testing but PCP next available isnt until Nov. Pt declined seeing any other providers.  CB: [phone]

## 2017-11-14 NOTE — Telephone Encounter (Signed)
Message sent to Dr. Creta Levin re: GYN referral for STD testing

## 2017-11-19 NOTE — Telephone Encounter (Signed)
Let the patient know that she can get her std testing here. Discuss with her that she should still plan to come in for an office visit for follow up. It would be a longer wait time for Gynecology

## 2017-11-20 NOTE — Telephone Encounter (Signed)
Left detailed message per ROI on voicemail-advising pt can get STD testing here and advised she should still plan to come in for an ov for f/u and it will be a wait time for gyn referral.  Advised to call office with any further questions or concerns. Dgaddy, CMA

## 2017-11-22 ENCOUNTER — Ambulatory Visit (INDEPENDENT_AMBULATORY_CARE_PROVIDER_SITE_OTHER): Payer: BC Managed Care – PPO | Admitting: Physician Assistant

## 2017-11-22 DIAGNOSIS — Z113 Encounter for screening for infections with a predominantly sexual mode of transmission: Secondary | ICD-10-CM | POA: Diagnosis not present

## 2017-11-22 LAB — POCT WET + KOH PREP
TRICH BY WET PREP: ABSENT
YEAST BY KOH: ABSENT
Yeast by wet prep: ABSENT

## 2017-11-22 NOTE — Telephone Encounter (Signed)
Pt came into the office today 11/22/2017 and had labs drawn for screening.

## 2017-11-22 NOTE — Telephone Encounter (Signed)
Pt will come in for labs, but does not and can not wait until 11/25 next available appointment , please call patient.

## 2017-11-23 ENCOUNTER — Other Ambulatory Visit: Payer: Self-pay | Admitting: Family Medicine

## 2017-11-23 DIAGNOSIS — N760 Acute vaginitis: Principal | ICD-10-CM

## 2017-11-23 DIAGNOSIS — B9689 Other specified bacterial agents as the cause of diseases classified elsewhere: Secondary | ICD-10-CM

## 2017-11-23 LAB — RPR: RPR: NONREACTIVE

## 2017-11-23 LAB — HIV ANTIBODY (ROUTINE TESTING W REFLEX): HIV Screen 4th Generation wRfx: NONREACTIVE

## 2017-11-23 LAB — HEPATITIS B SURFACE ANTIGEN: HEP B S AG: NEGATIVE

## 2017-11-23 MED ORDER — METRONIDAZOLE 500 MG PO TABS: 500.0000 mg | ORAL_TABLET | Freq: Two times a day (BID) | ORAL | 0 refills | Status: DC

## 2017-11-27 LAB — GC/CHLAMYDIA PROBE AMP
Chlamydia trachomatis, NAA: NEGATIVE
Neisseria gonorrhoeae by PCR: NEGATIVE

## 2018-02-27 ENCOUNTER — Telehealth: Payer: Self-pay | Admitting: Family Medicine

## 2018-02-27 DIAGNOSIS — Z113 Encounter for screening for infections with a predominantly sexual mode of transmission: Secondary | ICD-10-CM

## 2018-02-27 DIAGNOSIS — N898 Other specified noninflammatory disorders of vagina: Secondary | ICD-10-CM

## 2018-02-27 NOTE — Telephone Encounter (Signed)
Copied from CRM [id number]. Topic: Quick Communication - See Telephone Encounter >> Feb 27, 2018  3:11 PM Herby Abraham C wrote: CRM for notification. See Telephone encounter for: 02/27/18.  Pt called in to request STD testing. Pt scheduled her CPE for PCP's next available but stated that she cant wait until May to have STD testing. Pt would like to know if provider could have orders placed and she come in sooner to have testing? Pt says that provider did it this way the last time due to her availability  Please advise.

## 2018-02-27 NOTE — Telephone Encounter (Signed)
Please let the patient know that her orders have been placed.

## 2018-02-27 NOTE — Telephone Encounter (Signed)
Please advise 

## 2018-02-27 NOTE — Telephone Encounter (Signed)
Spoke with pt advised orders placed and she advises she will come tomorrow. Dgaddy, CMA

## 2018-02-28 ENCOUNTER — Ambulatory Visit (INDEPENDENT_AMBULATORY_CARE_PROVIDER_SITE_OTHER): Payer: BC Managed Care – PPO | Admitting: Family Medicine

## 2018-02-28 DIAGNOSIS — N898 Other specified noninflammatory disorders of vagina: Secondary | ICD-10-CM

## 2018-02-28 DIAGNOSIS — Z113 Encounter for screening for infections with a predominantly sexual mode of transmission: Secondary | ICD-10-CM

## 2018-03-04 LAB — GC/CHLAMYDIA PROBE AMP
CHLAMYDIA, DNA PROBE: NEGATIVE
Neisseria gonorrhoeae by PCR: NEGATIVE

## 2018-04-18 ENCOUNTER — Ambulatory Visit: Payer: BC Managed Care – PPO | Admitting: Emergency Medicine

## 2018-04-18 ENCOUNTER — Encounter: Payer: Self-pay | Admitting: Emergency Medicine

## 2018-04-18 ENCOUNTER — Other Ambulatory Visit: Payer: Self-pay

## 2018-04-18 VITALS — BP 90/60 | HR 76 | Temp 99.0°F | Resp 16 | Ht 64.25 in | Wt 171.0 lb

## 2018-04-18 DIAGNOSIS — J029 Acute pharyngitis, unspecified: Secondary | ICD-10-CM | POA: Insufficient documentation

## 2018-04-18 LAB — POCT RAPID STREP A (OFFICE): Rapid Strep A Screen: NEGATIVE

## 2018-04-18 MED ORDER — AZITHROMYCIN 250 MG PO TABS: ORAL_TABLET | ORAL | 0 refills | Status: DC

## 2018-04-18 NOTE — Patient Instructions (Addendum)
   If you have lab work done today you will be contacted with your lab results within the next 2 weeks.  If you have not heard from us then please contact us. The fastest way to get your results is to register for My Chart.   IF you received an x-ray today, you will receive an invoice from [facility]. Please contact [facility] at [phone] with questions or concerns regarding your invoice.   IF you received labwork today, you will receive an invoice from [facility]. Please contact [facility] at [phone] with questions or concerns regarding your invoice.   Our billing staff will not be able to assist you with questions regarding bills from these companies.  You will be contacted with the lab results as soon as they are available. The fastest way to get your results is to activate your My Chart account. Instructions are located on the last page of this paperwork. If you have not heard from us regarding the results in 2 weeks, please contact this office.     Sore Throat When you have a sore throat, your throat may feel:  Tender.  Burning.  Irritated.  Scratchy.  Painful when you swallow.  Painful when you talk. Many things can cause a sore throat, such as:  An infection.  Allergies.  Dry air.  Smoke or pollution.  Radiation treatment.  Gastroesophageal reflux disease (GERD).  A tumor. A sore throat can be the first sign of another sickness. It can happen with other problems, like:  Coughing.  Sneezing.  Fever.  Swelling in the neck. Most sore throats go away without treatment. Follow these instructions at home:      Take over-the-counter medicines only as told by your doctor. ? If your child has a sore throat, do not give your child aspirin.  Drink enough fluids to keep your pee (urine) pale yellow.  Rest when you feel you need to.  To help with pain: ? Sip warm liquids, such as broth, herbal tea, or warm water. ? Eat or drink  cold or frozen liquids, such as frozen ice pops. ? Gargle with a salt-water mixture 3-4 times a day or as needed. To make a salt-water mixture, add -1 tsp (3-6 g) of salt to 1 cup (237 mL) of warm water. Mix it until you cannot see the salt anymore. ? Suck on hard candy or throat lozenges. ? Put a cool-mist humidifier in your bedroom at night. ? Sit in the bathroom with the door closed for 5-10 minutes while you run hot water in the shower.  Do not use any products that contain nicotine or tobacco, such as cigarettes, e-cigarettes, and chewing tobacco. If you need help quitting, ask your doctor.  Wash your hands well and often with soap and water. If soap and water are not available, use hand sanitizer. Contact a doctor if:  You have a fever for more than 2-3 days.  You keep having symptoms for more than 2-3 days.  Your throat does not get better in 7 days.  You have a fever and your symptoms suddenly get worse.  Your child who is 3 months to 3 years old has a temperature of 102.2F (39C) or higher. Get help right away if:  You have trouble breathing.  You cannot swallow fluids, soft foods, or your saliva.  You have swelling in your throat or neck that gets worse.  You keep feeling sick to your stomach (nauseous).  You keep throwing up (  vomiting). Summary  A sore throat is pain, burning, irritation, or scratchiness in the throat. Many things can cause a sore throat.  Take over-the-counter medicines only as told by your doctor. Do not give your child aspirin.  Drink plenty of fluids, and rest as needed.  Contact a doctor if your symptoms get worse or your sore throat does not get better within 7 days. This information is not intended to replace advice given to you by your health care provider. Make sure you discuss any questions you have with your health care provider. Document Released: 11/01/2007 Document Revised: 06/24/2017 Document Reviewed: 06/24/2017 Elsevier  Interactive Patient Education  2019 Elsevier Inc.  

## 2018-04-18 NOTE — Progress Notes (Signed)
Weston Anna 28 y.o.   Chief Complaint  Patient presents with  . Sore Throat    x 2 weeks    HISTORY OF PRESENT ILLNESS: This is a 28 y.o. female complaining of sore throat for 2 weeks.  No recent travel or coronavirus exposure.  No flulike symptoms. Past medical history remarkable for ulcerative colitis.  Sore Throat   This is a new problem. The current episode started 1 to 4 weeks ago. The problem has been gradually worsening. There has been no fever. The pain is at a severity of 3/10. The pain is mild. Pertinent negatives include no abdominal pain, congestion, coughing, diarrhea, ear pain, headaches, hoarse voice, neck pain, shortness of breath, stridor, swollen glands, trouble swallowing or vomiting. She has had no exposure to strep or mono. She has tried nothing for the symptoms.     Prior to Admission medications   Medication Sig Start Date End Date Taking? Authorizing Provider  balsalazide (COLAZAL) 750 MG capsule Take 2,250 mg by mouth 3 (three) times daily.   Yes [provider]  cetirizine (ZYRTEC) 10 MG tablet TAKE 1 TABLET BY MOUTH ONCE DAILY 04/29/17  Yes Stallings, Zoe A, MD  hydrocortisone 2.5 % cream APPLY TOPICALLY TWICE DAILY FOR 4-6 WEEKS Patient not taking: Reported on 04/18/2018 10/11/16   Doristine Bosworth, MD  metroNIDAZOLE (FLAGYL) 500 MG tablet Take 1 tablet (500 mg total) by mouth 2 (two) times daily with a meal. DO NOT CONSUME ALCOHOL WHILE TAKING THIS MEDICATION. Patient not taking: Reported on 04/18/2018 11/23/17   Doristine Bosworth, MD    No Known Allergies  Patient Active Problem List   Diagnosis Date Noted  . Ulcerative colitis (HCC) 06/30/2016  . Anemia 06/30/2016  . Hypokalemia 06/30/2016  . Ulcerative colitis, acute, with rectal bleeding (HCC) 06/30/2016  . Large breasts 05/07/2013  . Obesity, unspecified 05/07/2013    Past Medical History:  Diagnosis Date  . Ulcerative colitis (HCC)     History reviewed. No pertinent surgical history.   Social History   Socioeconomic History  . Marital status: Single    Spouse name: n/a  . Number of children: 0  . Years of education: college  . Highest education level: Not on file  Occupational History  . Occupation: Runner, broadcasting/film/video    Comment: Administrator  Social Needs  . Financial resource strain: Not on file  . Food insecurity:    Worry: Not on file    Inability: Not on file  . Transportation needs:    Medical: Not on file    Non-medical: Not on file  Tobacco Use  . Smoking status: Never Smoker  . Smokeless tobacco: Never Used  Substance and Sexual Activity  . Alcohol use: No    Alcohol/week: 0.0 standard drinks  . Drug use: No  . Sexual activity: Yes    Partners: Male    Birth control/protection: Condom    Comment: inconsistent condom use  Lifestyle  . Physical activity:    Days per week: Not on file    Minutes per session: Not on file  . Stress: Not on file  Relationships  . Social connections:    Talks on phone: Not on file    Gets together: Not on file    Attends religious service: Not on file    Active member of club or organization: Not on file    Attends meetings of clubs or organizations: Not on file    Relationship status: Not on file  .  Intimate partner violence:    Fear of current or ex partner: Not on file    Emotionally abused: Not on file    Physically abused: Not on file    Forced sexual activity: Not on file  Other Topics Concern  . Not on file  Social History Narrative   In graduate program at [employer] and anticipates a Master's Degree in [place] American Literature 06/2015. Her undergraduate degree in [employer] is from [employer].   Lives with her sister.    Family History  Problem Relation Age of Onset  . Hyperlipidemia Father   . Hypertension Father   . Sleep apnea Father   . Diabetes Maternal Grandmother   . Diabetes Paternal Grandmother   . Hypertension Paternal Grandmother   . Heart disease Paternal  Grandfather   . Hypertension Paternal Grandfather   . Kidney disease Paternal Grandfather   . Diabetes Paternal Grandfather      Review of Systems  Constitutional: Negative.  Negative for chills and fever.  HENT: Positive for sore throat. Negative for congestion, ear pain, hoarse voice and trouble swallowing.   Eyes: Negative.   Respiratory: Negative.  Negative for cough, shortness of breath, wheezing and stridor.   Cardiovascular: Negative.  Negative for chest pain and palpitations.  Gastrointestinal: Negative for abdominal pain, diarrhea, nausea and vomiting.  Genitourinary: Negative.  Negative for dysuria and hematuria.  Musculoskeletal: Negative.  Negative for neck pain.  Skin: Negative.  Negative for rash.  Neurological: Negative.  Negative for headaches.  Endo/Heme/Allergies: Negative.     Vitals:   04/18/18 1328  BP: 90/60  Pulse: 76  Resp: 16  Temp: 99 F (37.2 C)  SpO2: 98%    Physical Exam Vitals signs reviewed.  Constitutional:      Appearance: Normal appearance.  HENT:     Head: Normocephalic and atraumatic.     Nose: Nose normal.     Mouth/Throat:     Mouth: Mucous membranes are moist.     Pharynx: Oropharynx is clear. Posterior oropharyngeal erythema present. No oropharyngeal exudate.  Eyes:     Extraocular Movements: Extraocular movements intact.     Conjunctiva/sclera: Conjunctivae normal.     Pupils: Pupils are equal, round, and reactive to light.  Neck:     Musculoskeletal: Normal range of motion and neck supple.  Cardiovascular:     Rate and Rhythm: Normal rate.     Heart sounds: Normal heart sounds.  Pulmonary:     Effort: Pulmonary effort is normal.     Breath sounds: Normal breath sounds.  Musculoskeletal: Normal range of motion.  Lymphadenopathy:     Cervical: No cervical adenopathy.  Skin:    General: Skin is warm and dry.     Capillary Refill: Capillary refill takes less than 2 seconds.  Neurological:     General: No focal deficit  present.     Mental Status: She is alert and oriented to person, place, and time.    A total of 25 minutes was spent in the room with the patient, greater than 50% of which was in counseling/coordination of care regarding differential diagnosis, treatment, medications, prognosis, and need for follow-up if no better or worse.   ASSESSMENT & PLAN: Ciani was seen today for sore throat.  Diagnoses and all orders for this visit:  Sore throat -     Culture, Group A Strep -     azithromycin (ZITHROMAX) 250 MG tablet; Sig as indicated  Acute pharyngitis, unspecified etiology  Patient Instructions       If you have lab work done today you will be contacted with your lab results within the next 2 weeks.  If you have not heard from [place] then please contact us. The fastest way to get your results is to register for My Chart.   IF you received an x-ray today, you will receive an invoice from [facility]. Please contact [facility] at [phone] with questions or concerns regarding your invoice.   IF you received labwork today, you will receive an invoice from [facility]. Please contact [facility] at [phone] with questions or concerns regarding your invoice.   Our billing staff will not be able to assist you with questions regarding bills from these companies.  You will be contacted with the lab results as soon as they are available. The fastest way to get your results is to activate your My Chart account. Instructions are located on the last page of this paperwork. If you have not heard from [place] regarding the results in 2 weeks, please contact this office.     Sore Throat When you have a sore throat, your throat may feel:  Tender.  Burning.  Irritated.  Scratchy.  Painful when you swallow.  Painful when you talk. Many things can cause a sore throat, such as:  An infection.  Allergies.  Dry air.  Smoke or pollution.  Radiation treatment.   Gastroesophageal reflux disease (GERD).  A tumor. A sore throat can be the first sign of another sickness. It can happen with other problems, like:  Coughing.  Sneezing.  Fever.  Swelling in the neck. Most sore throats go away without treatment. Follow these instructions at home:      Take over-the-counter medicines only as told by your doctor. ? If your child has a sore throat, do not give your child aspirin.  Drink enough fluids to keep your pee (urine) pale yellow.  Rest when you feel you need to.  To help with pain: ? Sip warm liquids, such as broth, herbal tea, or warm water. ? Eat or drink cold or frozen liquids, such as frozen ice pops. ? Gargle with a salt-water mixture 3-4 times a day or as needed. To make a salt-water mixture, add -1 tsp (3-6 g) of salt to 1 cup (237 mL) of warm water. Mix it until you cannot see the salt anymore. ? Suck on hard candy or throat lozenges. ? Put a cool-mist humidifier in your bedroom at night. ? Sit in the bathroom with the door closed for 5-10 minutes while you run hot water in the shower.  Do not use any products that contain nicotine or tobacco, such as cigarettes, e-cigarettes, and chewing tobacco. If you need help quitting, ask your doctor.  Wash your hands well and often with soap and water. If soap and water are not available, use hand sanitizer. Contact a doctor if:  You have a fever for more than 2-3 days.  You keep having symptoms for more than 2-3 days.  Your throat does not get better in 7 days.  You have a fever and your symptoms suddenly get worse.  Your child who is 3 months to 74 years old has a temperature of 102.37F (39C) or higher. Get help right away if:  You have trouble breathing.  You cannot swallow fluids, soft foods, or your saliva.  You have swelling in your throat or neck that gets worse.  You keep feeling sick to your stomach (nauseous).  You keep throwing up (vomiting). Summary  A sore  throat is pain, burning, irritation, or scratchiness in the throat. Many things can cause a sore throat.  Take over-the-counter medicines only as told by your doctor. Do not give your child aspirin.  Drink plenty of fluids, and rest as needed.  Contact a doctor if your symptoms get worse or your sore throat does not get better within 7 days. This information is not intended to replace advice given to you by your health care provider. Make sure you discuss any questions you have with your health care provider. Document Released: 11/01/2007 Document Revised: 06/24/2017 Document Reviewed: 06/24/2017 Elsevier Interactive Patient Education  2019 Elsevier Inc.       Edwina Barth, MD [facility]

## 2018-04-20 LAB — CULTURE, GROUP A STREP

## 2018-04-21 ENCOUNTER — Encounter: Payer: Self-pay | Admitting: Emergency Medicine

## 2018-04-22 NOTE — Telephone Encounter (Signed)
Most likely viral illness so it has to run its course.  Thanks.

## 2018-06-12 ENCOUNTER — Encounter: Payer: BC Managed Care – PPO | Admitting: Family Medicine

## 2018-06-14 ENCOUNTER — Telehealth: Payer: BC Managed Care – PPO | Admitting: Physician Assistant

## 2018-06-14 DIAGNOSIS — N898 Other specified noninflammatory disorders of vagina: Secondary | ICD-10-CM | POA: Diagnosis not present

## 2018-06-14 MED ORDER — FLUCONAZOLE 150 MG PO TABS: 150.0000 mg | ORAL_TABLET | Freq: Once | ORAL | 0 refills | Status: AC

## 2018-06-14 NOTE — Progress Notes (Signed)
We are sorry that you are not feeling well. Here is how we plan to help! Based on what you shared with me it looks like you: May have a yeast vaginosis   Vaginosis is an inflammation of the vagina that can result in discharge, itching and pain. The cause is usually a change in the normal balance of vaginal bacteria or an infection. Vaginosis can also result from reduced estrogen levels after menopause.  The most common causes of vaginosis are:   Bacterial vaginosis which results from an overgrowth of one on several organisms that are normally present in your vagina.   Yeast infections which are caused by a naturally occurring fungus called candida.   Vaginal atrophy (atrophic vaginosis) which results from the thinning of the vagina from reduced estrogen levels after menopause.   Trichomoniasis which is caused by a parasite and is commonly transmitted by sexual intercourse.  Factors that increase your risk of developing vaginosis include: Medications, such as antibiotics and steroids Uncontrolled diabetes Use of hygiene products such as bubble bath, vaginal spray or vaginal deodorant Douching Wearing damp or tight-fitting clothing Using an intrauterine device (IUD) for birth control Hormonal changes, such as those associated with pregnancy, birth control pills or menopause Sexual activity Having a sexually transmitted infection  Your treatment plan is A single Diflucan (fluconazole)  tablet once.  I have electronically sent this prescription into the pharmacy that you have chosen. Given the discharge let hold off on antibiotics for a urine infection as it can worsen your discharge.  If you continue to have pain with urination please submit a visit tomorrow.   Be sure to take all of the medication as directed. Stop taking any medication if you develop a rash, tongue swelling or shortness of breath. Mothers who are breast feeding should consider pumping and discarding their breast milk  while on these antibiotics. However, there is no consensus that infant exposure at these doses would be harmful.  Remember that medication creams can weaken latex condoms. Marland Kitchen   HOME CARE:  Good hygiene may prevent some types of vaginosis from recurring and may relieve some symptoms:  Avoid baths, hot tubs and whirlpool spas. Rinse soap from your outer genital area after a shower, and dry the area well to prevent irritation. Don't use scented or harsh soaps, such as those with deodorant or antibacterial action. Avoid irritants. These include scented tampons and pads. Wipe from front to back after using the toilet. Doing so avoids spreading fecal bacteria to your vagina.  Other things that may help prevent vaginosis include:  Don't douche. Your vagina doesn't require cleansing other than normal bathing. Repetitive douching disrupts the normal organisms that reside in the vagina and can actually increase your risk of vaginal infection. Douching won't clear up a vaginal infection. Use a latex condom. Both female and female latex condoms may help you avoid infections spread by sexual contact. Wear cotton underwear. Also wear pantyhose with a cotton crotch. If you feel comfortable without it, skip wearing underwear to bed. Yeast thrives in [employer] Your symptoms should improve in the next day or two.  GET HELP RIGHT AWAY IF:  You have pain in your lower abdomen ( pelvic area or over your ovaries) You develop nausea or vomiting You develop a fever Your discharge changes or worsens You have persistent pain with intercourse You develop shortness of breath, a rapid pulse, or you faint.  These symptoms could be signs of problems or infections that need to  be evaluated by a medical provider now.  MAKE SURE YOU   Understand these instructions. Will watch your condition. Will get help right away if you are not doing well or get worse.  Your e-visit answers were reviewed by a board  certified advanced clinical practitioner to complete your personal care plan. Depending upon the condition, your plan could have included both over the counter or prescription medications. Please review your pharmacy choice to make sure that you have choses a pharmacy that is open for you to pick up any needed prescription, Your safety is important to [place]. If you have drug allergies check your prescription carefully.   You can use MyChart to ask questions about today's visit, request a non-urgent call back, or ask for a work or school excuse for 24 hours related to this e-Visit. If it has been greater than 24 hours you will need to follow up with your provider, or enter a new e-Visit to address those concerns. You will get a MyChart message within the next two days asking about your experience. I hope that your e-visit has been valuable and will speed your recovery.  ===View-only below this line===   ----- Message -----    From: Weston Anna    Sent: 06/14/2018  8:56 AM EDT      To: E-Visit Mailing List Subject: E-Visit Submission: Vaginal Symptoms  E-Visit Submission: Vaginal Symptoms --------------------------------  Question: Which of the following are you experiencing? Answer:   Vaginal itching  Question: Are you having pain while passing urine? Answer:   No, I have no pain while urinating  Question: Which of the following applies to your vaginal discharge Answer:   I have a white/milky discharge  Question: Are you pregnant? Answer:   I am confident that I am not pregnant  Question: Are you breastfeeding? Answer:   No  Question: Which of the following are you experiencing? Answer:   Frequent urination  Question: Do you have any sores on your genitals? Answer:   No  Question: Have you taken antibiotics recently? Answer:   I have not been on any antibiotics  Question: Do you do any of the following? Answer:   None of the above  Question: Which of the following applies to  your menstrual period? Answer:   I had a menstrual period in the last 2 weeks  Question: Have you had similar symptoms in the past? Answer:   Yes, I have had similar symptoms more than once before  Question: When you had similar symptoms in  the past, did any of the following work? Answer:   Pills for yeast infection            Pills for urine infection            Cranberry juice            Yogurt  Question: Do you have a fever? Answer:   No, I do not have a fever  Question: During the past 2 months, have you had sexual contact with a specific person for the first time? Answer:   Yes  Question: Has a person with whom you have had sexual contact been recently told they have a disease possibly acquired through sex? Answer:   No  Question: Please list your medication allergies that you may have ? (If 'none' , please list as 'none') Answer:   None  Question: Please list any additional comments  Answer:     A total of 5-10 minutes  was spent evaluating this patients questionnaire and formulating a plan of care.

## 2018-06-17 ENCOUNTER — Ambulatory Visit: Payer: BC Managed Care – PPO | Admitting: Family Medicine

## 2018-08-26 ENCOUNTER — Telehealth: Payer: Self-pay | Admitting: Family Medicine

## 2018-08-26 NOTE — Telephone Encounter (Signed)
Patient is requesting an order for covid-19 testing as this is required for her to return to work. Patient states that she has no symptoms.

## 2018-08-27 ENCOUNTER — Other Ambulatory Visit: Payer: Self-pay

## 2018-08-27 DIAGNOSIS — Z20822 Contact with and (suspected) exposure to covid-19: Secondary | ICD-10-CM

## 2018-08-31 ENCOUNTER — Encounter: Payer: Self-pay | Admitting: Family Medicine

## 2018-08-31 LAB — NOVEL CORONAVIRUS, NAA: SARS-CoV-2, NAA: NOT DETECTED

## 2018-09-02 NOTE — Telephone Encounter (Signed)
Attempted to call pt to let her know that we can do this test, however in order for [place] to do this test pt will need an office visit. There was no answer and her mailbox was full so I was unable to leave a message.

## 2018-09-02 NOTE — Telephone Encounter (Signed)
Copied from [place] [phone]. Topic: General - Inquiry >> Sep 02, 2018  3:18 PM Richardo Priest, NT wrote: Reason for CRM: Patient called in stating she got tested for COVID and she would like a work note returning to work printed and on [employer]. Call back is [phone].

## 2018-09-05 NOTE — Telephone Encounter (Signed)
Hi Dr. Nolon Rod,   Ms. Saefong came into the office today requesting to be cleared for work in relation to a negative COVID test on 07/22.  I did provide a copy of her results to her as well as a letter stating that she is our patient and has had a negative COVID test; however I cannot provide a letter clearing her to return to work as she has never been evaluated.  I offered to make an appointment so that we could evaluate her and potentially provide a note but Ms. Moncure chose not to schedule an appointment at this time.  I wanted to make you aware of the situation as Ms. Vanevery was upset that a note could not be provided.  Please let me know if there is anything I can do to help.   Thank you,  Wilfred Curtis

## 2018-09-17 ENCOUNTER — Encounter: Payer: Self-pay | Admitting: Registered Nurse

## 2018-09-17 ENCOUNTER — Ambulatory Visit (INDEPENDENT_AMBULATORY_CARE_PROVIDER_SITE_OTHER): Payer: BC Managed Care – PPO | Admitting: Registered Nurse

## 2018-09-17 ENCOUNTER — Other Ambulatory Visit (HOSPITAL_COMMUNITY)
Admission: RE | Admit: 2018-09-17 | Discharge: 2018-09-17 | Disposition: A | Discharge status: Home / Self Care | Payer: BC Managed Care – PPO | Source: Ambulatory Visit | Attending: Family Medicine | Admitting: Family Medicine

## 2018-09-17 ENCOUNTER — Other Ambulatory Visit: Payer: Self-pay

## 2018-09-17 VITALS — BP 108/71 | HR 74 | Temp 98.8°F | Resp 16 | Ht 64.76 in | Wt 181.0 lb

## 2018-09-17 DIAGNOSIS — Z130 Encounter for screening for diseases of the blood and blood-forming organs and certain disorders involving the immune mechanism: Secondary | ICD-10-CM

## 2018-09-17 DIAGNOSIS — Z3009 Encounter for other general counseling and advice on contraception: Secondary | ICD-10-CM

## 2018-09-17 DIAGNOSIS — J302 Other seasonal allergic rhinitis: Secondary | ICD-10-CM

## 2018-09-17 DIAGNOSIS — U071 COVID-19: Secondary | ICD-10-CM

## 2018-09-17 DIAGNOSIS — Z0001 Encounter for general adult medical examination with abnormal findings: Secondary | ICD-10-CM | POA: Diagnosis not present

## 2018-09-17 DIAGNOSIS — Z113 Encounter for screening for infections with a predominantly sexual mode of transmission: Secondary | ICD-10-CM

## 2018-09-17 DIAGNOSIS — K5100 Ulcerative (chronic) pancolitis without complications: Secondary | ICD-10-CM

## 2018-09-17 DIAGNOSIS — R1024 Suprapubic pain: Secondary | ICD-10-CM

## 2018-09-17 DIAGNOSIS — R102 Pelvic and perineal pain: Secondary | ICD-10-CM

## 2018-09-17 DIAGNOSIS — Z0000 Encounter for general adult medical examination without abnormal findings: Secondary | ICD-10-CM

## 2018-09-17 DIAGNOSIS — J988 Other specified respiratory disorders: Secondary | ICD-10-CM

## 2018-09-17 DIAGNOSIS — Z1329 Encounter for screening for other suspected endocrine disorder: Secondary | ICD-10-CM | POA: Diagnosis not present

## 2018-09-17 DIAGNOSIS — Z13220 Encounter for screening for lipoid disorders: Secondary | ICD-10-CM | POA: Diagnosis not present

## 2018-09-17 DIAGNOSIS — Z13228 Encounter for screening for other metabolic disorders: Secondary | ICD-10-CM

## 2018-09-17 LAB — POCT URINALYSIS DIP (CLINITEK)
Bilirubin, UA: NEGATIVE
Glucose, UA: NEGATIVE mg/dL
Leukocytes, UA: NEGATIVE
Nitrite, UA: POSITIVE — AB
POC PROTEIN,UA: 30 — AB
Spec Grav, UA: 1.025 (ref 1.010–1.025)
Urobilinogen, UA: 1 E.U./dL
pH, UA: 6.5 (ref 5.0–8.0)

## 2018-09-17 LAB — POCT URINE PREGNANCY: Preg Test, Ur: NEGATIVE

## 2018-09-17 MED ORDER — BALSALAZIDE DISODIUM 750 MG PO CAPS: 2250.0000 mg | ORAL_CAPSULE | Freq: Three times a day (TID) | ORAL | 3 refills | Status: DC

## 2018-09-17 MED ORDER — CETIRIZINE HCL 10 MG PO TABS: 10.0000 mg | ORAL_TABLET | Freq: Every day | ORAL | 3 refills | Status: DC

## 2018-09-17 MED ORDER — FLUTICASONE PROPIONATE 50 MCG/ACT NA SUSP: 2.0000 | Freq: Every day | NASAL | 6 refills | Status: DC

## 2018-09-17 MED ORDER — NITROFURANTOIN MONOHYD MACRO 100 MG PO CAPS: 100.0000 mg | ORAL_CAPSULE | Freq: Two times a day (BID) | ORAL | 0 refills | Status: AC

## 2018-09-17 NOTE — Progress Notes (Signed)
Established Patient Office Visit  Subjective:  Patient ID: Robin Mora, female    DOB: [birth year]  Age: 28 y.o. MRN: [id number]  CC:  Chief Complaint  Patient presents with  . Annual Exam    HPI Thomas Rhude presents for CPE. She requests STD testing and states that she needs a COVID test for work at this time - she is a Licensed conveyancer.   She states that she is on her menses today, but wishes to come back for wet prep and Pap. She prefers to get these done annually for peace of mind, though we reviewed normal screening guidelines. She states that she has abnormal cramping during current menses, though bleeding has been normal in quality and quantity. She does not use BCM but is sexually active, we will run POCT Urine preg test today.   Otherwise, no complaints.   Past Medical History:  Diagnosis Date  . Ulcerative colitis ([place])     History reviewed. No pertinent surgical history.  Family History  Problem Relation Age of Onset  . Hyperlipidemia Father   . Hypertension Father   . Sleep apnea Father   . Diabetes Maternal Grandmother   . Diabetes Paternal Grandmother   . Hypertension Paternal Grandmother   . Heart disease Paternal Grandfather   . Hypertension Paternal Grandfather   . Kidney disease Paternal Grandfather   . Diabetes Paternal Grandfather     Social History   Socioeconomic History  . Marital status: Single    Spouse name: n/a  . Number of children: 0  . Years of education: college  . Highest education level: Not on file  Occupational History  . 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 Financial resource strain: Not on file  . Food insecurity    Worry: Not on file    Inability: Not on file  . Transportation needs    Medical: Not on file    Non-medical: Not on file  Tobacco Use  . Smoking status: Never Smoker  . Smokeless tobacco: Never Used  Substance and Sexual Activity  . Alcohol use: No    Alcohol/week: 0.0  standard drinks  . Drug use: No  . Sexual activity: Yes    Partners: Male    Birth control/protection: Condom    Comment: inconsistent condom use  Lifestyle  . Physical activity    Days per week: Not on file    Minutes per session: Not on file  . Stress: Not on file  Relationships  . Social Herbalist on phone: Not on file    Gets together: Not on file    Attends religious service: Not on file    Active member of club or organization: Not on file    Attends meetings of clubs or organizations: Not on file    Relationship status: Not on file  . Intimate partner violence    Fear of current or ex partner: Not on file    Emotionally abused: Not on file    Physically abused: Not on file    Forced sexual activity: Not on file  Other Topics Concern  . Not on file  Social History Narrative   In graduate program at [employer] and anticipates a Master's Degree in [place] and [place] American Literature 06/2015. Her undergraduate degree in [employer] is from [employer].   Lives with her sister.    Outpatient Medications Prior to Visit  Medication Sig Dispense Refill  .  azithromycin (ZITHROMAX) 250 MG tablet Sig as indicated 6 tablet 0  . balsalazide (COLAZAL) 750 MG capsule Take 2,250 mg by mouth 3 (three) times daily.    . cetirizine (ZYRTEC) 10 MG tablet TAKE 1 TABLET BY MOUTH ONCE DAILY 30 tablet 11  . hydrocortisone 2.5 % cream APPLY TOPICALLY TWICE DAILY FOR 4-6 WEEKS 28 g 0  . metroNIDAZOLE (FLAGYL) 500 MG tablet Take 1 tablet (500 mg total) by mouth 2 (two) times daily with a meal. DO NOT CONSUME ALCOHOL WHILE TAKING THIS MEDICATION. 14 tablet 0   No facility-administered medications prior to visit.     No Known Allergies  ROS Review of Systems  Constitutional: Negative.   HENT: Negative.   Eyes: Negative.   Respiratory: Negative.   Cardiovascular: Negative.   Gastrointestinal: Negative.   Endocrine: Negative.   Genitourinary: Negative.   Musculoskeletal:  Negative.   Skin: Negative.   Allergic/Immunologic: Negative.   Neurological: Negative.   Hematological: Negative.   Psychiatric/Behavioral: Negative.   All other systems reviewed and are negative.     Objective:    Physical Exam  Constitutional: She is oriented to person, place, and time. She appears well-developed and well-nourished. No distress.  HENT:  Head: Normocephalic and atraumatic.  Right Ear: External ear normal.  Left Ear: External ear normal.  Nose: Nose normal.  Mouth/Throat: Oropharynx is clear and moist. No oropharyngeal exudate.  Eyes: Pupils are equal, round, and reactive to light. Conjunctivae and EOM are normal. Right eye exhibits no discharge. Left eye exhibits no discharge. No scleral icterus.  Neck: Normal range of motion. Neck supple. No tracheal deviation present. No thyromegaly present.  Cardiovascular: Normal rate, regular rhythm, normal heart sounds and intact distal pulses. Exam reveals no gallop and no friction rub.  No murmur heard. Pulmonary/Chest: Effort normal and breath sounds normal. No respiratory distress. She has no wheezes. She has no rales. She exhibits no tenderness.  Abdominal: Soft. Bowel sounds are normal.  Musculoskeletal: Normal range of motion.        General: No tenderness, deformity or edema.  Lymphadenopathy:    She has no cervical adenopathy.  Neurological: She is alert and oriented to person, place, and time. No cranial nerve deficit. She exhibits normal muscle tone. Coordination normal.  Skin: Skin is warm and dry. No rash noted. She is not diaphoretic. No erythema. No pallor.  Psychiatric: She has a normal mood and affect. Her behavior is normal. Judgment and thought content normal.  Nursing note and vitals reviewed.   BP 108/71   Pulse 74   Temp 98.8 F (37.1 C) (Oral)   Resp 16   Ht 5' 4.76" (1.645 m)   Wt 181 lb (82.1 kg)   LMP 09/17/2018   SpO2 98%   BMI 30.34 kg/m  Wt Readings from Last 3 Encounters:  09/17/18  181 lb (82.1 kg)  04/18/18 171 lb (77.6 kg)  02/22/17 185 lb 9.6 oz (84.2 kg)     Health Maintenance Due  Topic Date Due  . INFLUENZA VACCINE  09/06/2018    There are no preventive care reminders to display for this patient.  Lab Results  Component Value Date   TSH 2.06 07/22/2014   Lab Results  Component Value Date   WBC 4.0 07/02/2016   HGB 10.5 (L) 07/02/2016   HCT 32.7 (L) 07/02/2016   MCV 80.3 07/02/2016   PLT 411 (H) 07/02/2016   Lab Results  Component Value Date   NA 139 07/02/2016  K 3.7 07/02/2016   CO2 26 07/02/2016   GLUCOSE 96 07/02/2016   BUN <5 (L) 07/02/2016   CREATININE 0.63 07/02/2016   BILITOT 0.4 07/01/2016   ALKPHOS 49 07/01/2016   AST 12 (L) 07/01/2016   ALT 10 (L) 07/01/2016   PROT 6.1 (L) 07/01/2016   ALBUMIN 2.8 (L) 07/01/2016   CALCIUM 8.5 (L) 07/02/2016   ANIONGAP 7 07/02/2016   No results found for: CHOL No results found for: HDL No results found for: LDLCALC No results found for: TRIG No results found for: CHOLHDL No results found for: [id number]    Assessment & Plan:   Problem List Items Addressed This Visit      Digestive   Ulcerative colitis (HCC)   Relevant Medications   balsalazide (COLAZAL) 750 MG capsule    Other Visit Diagnoses    Screen for STD (sexually transmitted disease)    -  Primary   Relevant Orders   RPR   HIV Antibody (routine testing w rflx)   Urine cytology ancillary only   Screening for endocrine, metabolic and immunity disorder       Relevant Orders   CBC   Comprehensive metabolic panel   Hemoglobin A1c   TSH   Lipid screening       Relevant Orders   Lipid panel   COVID-19       Relevant Orders   Novel Coronavirus, NAA ([facility])   Routine general medical examination at a health care facility       Seasonal allergies       Relevant Medications   cetirizine (ZYRTEC) 10 MG tablet   fluticasone (FLONASE) 50 MCG/ACT nasal spray      Meds ordered this encounter  Medications  . balsalazide  (COLAZAL) 750 MG capsule    Sig: Take 3 capsules (2,250 mg total) by mouth 3 (three) times daily.    Dispense:  270 capsule    Refill:  3    Order Specific Question:   Supervising Provider    Answer:   Collie SiadSTALLINGS, ZOE A [id number]]  . cetirizine (ZYRTEC) 10 MG tablet    Sig: Take 1 tablet (10 mg total) by mouth daily.    Dispense:  90 tablet    Refill:  3    Please consider 90 day supplies to promote better adherence    Order Specific Question:   Supervising Provider    Answer:   Collie SiadSTALLINGS, ZOE A [id number]]  . fluticasone (FLONASE) 50 MCG/ACT nasal spray    Sig: Place 2 sprays into both nostrils daily.    Dispense:  16 g    Refill:  6    Order Specific Question:   Supervising Provider    Answer:   [name], ZOE A [id number]]    Follow-up: Return if symptoms worsen or fail to improve.   PLAN  Exam with normal findings.  Labs as ordered above - will follow up with any results.  Pt understands that for Pap and wet prep, she may present no sooner than 5 days after end of current menses.  Onsite dip reveals nitrites in urine, given patient reporting recent history of suprapubic pain, will treat with nitrofurantoin 100mg  PO bid for five days.  Patient encouraged to call clinic with any questions, comments, or concerns.   Janeece Ageeichard Glenis Musolf, NP

## 2018-09-17 NOTE — Patient Instructions (Addendum)
   If you have lab work done today you will be contacted with your lab results within the next 2 weeks.  If you have not heard from us then please contact us. The fastest way to get your results is to register for My Chart.   IF you received an x-ray today, you will receive an invoice from [facility]. Please contact [facility] at [phone] with questions or concerns regarding your invoice.   IF you received labwork today, you will receive an invoice from [facility]. Please contact [facility] at [phone] with questions or concerns regarding your invoice.   Our billing staff will not be able to assist you with questions regarding bills from these companies.  You will be contacted with the lab results as soon as they are available. The fastest way to get your results is to activate your My Chart account. Instructions are located on the last page of this paperwork. If you have not heard from us regarding the results in 2 weeks, please contact this office.       Health Maintenance, Female Adopting a healthy lifestyle and getting preventive care are important in promoting health and wellness. Ask your health care provider about:  The right schedule for you to have regular tests and exams.  Things you can do on your own to prevent diseases and keep yourself healthy. What should I know about diet, weight, and exercise? Eat a healthy diet   Eat a diet that includes plenty of vegetables, fruits, low-fat dairy products, and lean protein.  Do not eat a lot of foods that are high in solid fats, added sugars, or sodium. Maintain a healthy weight Body mass index (BMI) is used to identify weight problems. It estimates body fat based on height and weight. Your health care provider can help determine your BMI and help you achieve or maintain a healthy weight. Get regular exercise Get regular exercise. This is one of the most important things you can do for your health. Most  adults should:  Exercise for at least 150 minutes each week. The exercise should increase your heart rate and make you sweat (moderate-intensity exercise).  Do strengthening exercises at least twice a week. This is in addition to the moderate-intensity exercise.  Spend less time sitting. Even light physical activity can be beneficial. Watch cholesterol and blood lipids Have your blood tested for lipids and cholesterol at 28 years of age, then have this test every 5 years. Have your cholesterol levels checked more often if:  Your lipid or cholesterol levels are high.  You are older than 28 years of age.  You are at high risk for heart disease. What should I know about cancer screening? Depending on your health history and family history, you may need to have cancer screening at various ages. This may include screening for:  Breast cancer.  Cervical cancer.  Colorectal cancer.  Skin cancer.  Lung cancer. What should I know about heart disease, diabetes, and high blood pressure? Blood pressure and heart disease  High blood pressure causes heart disease and increases the risk of stroke. This is more likely to develop in people who have high blood pressure readings, are of African descent, or are overweight.  Have your blood pressure checked: ? Every 3-5 years if you are 18-39 years of age. ? Every year if you are 40 years old or older. Diabetes Have regular diabetes screenings. This checks your fasting blood sugar level. Have the screening done:  Once   every three years after age 40 if you are at a normal weight and have a low risk for diabetes.  More often and at a younger age if you are overweight or have a high risk for diabetes. What should I know about preventing infection? Hepatitis B If you have a higher risk for hepatitis B, you should be screened for this virus. Talk with your health care provider to find out if you are at risk for hepatitis B infection. Hepatitis  C Testing is recommended for:  Everyone born from 1945 through 1965.  Anyone with known risk factors for hepatitis C. Sexually transmitted infections (STIs)  Get screened for STIs, including gonorrhea and chlamydia, if: ? You are sexually active and are younger than 28 years of age. ? You are older than 28 years of age and your health care provider tells you that you are at risk for this type of infection. ? Your sexual activity has changed since you were last screened, and you are at increased risk for chlamydia or gonorrhea. Ask your health care provider if you are at risk.  Ask your health care provider about whether you are at high risk for HIV. Your health care provider may recommend a prescription medicine to help prevent HIV infection. If you choose to take medicine to prevent HIV, you should first get tested for HIV. You should then be tested every 3 months for as long as you are taking the medicine. Pregnancy  If you are about to stop having your period (premenopausal) and you may become pregnant, seek counseling before you get pregnant.  Take 400 to 800 micrograms (mcg) of folic acid every day if you become pregnant.  Ask for birth control (contraception) if you want to prevent pregnancy. Osteoporosis and menopause Osteoporosis is a disease in which the bones lose minerals and strength with aging. This can result in bone fractures. If you are 65 years old or older, or if you are at risk for osteoporosis and fractures, ask your health care provider if you should:  Be screened for bone loss.  Take a calcium or vitamin D supplement to lower your risk of fractures.  Be given hormone replacement therapy (HRT) to treat symptoms of menopause. Follow these instructions at home: Lifestyle  Do not use any products that contain nicotine or tobacco, such as cigarettes, e-cigarettes, and chewing tobacco. If you need help quitting, ask your health care provider.  Do not use street  drugs.  Do not share needles.  Ask your health care provider for help if you need support or information about quitting drugs. Alcohol use  Do not drink alcohol if: ? Your health care provider tells you not to drink. ? You are pregnant, may be pregnant, or are planning to become pregnant.  If you drink alcohol: ? Limit how much you use to 0-1 drink a day. ? Limit intake if you are breastfeeding.  Be aware of how much alcohol is in your drink. In the [place] one drink equals one 12 oz bottle of beer (355 mL), one 5 oz glass of wine (148 mL), or one 1 oz glass of hard liquor (44 mL). General instructions  Schedule regular health, dental, and eye exams.  Stay current with your vaccines.  Tell your health care provider if: ? You often feel depressed. ? You have ever been abused or do not feel safe at home. Summary  Adopting a healthy lifestyle and getting preventive care are important in promoting health and   wellness.  Follow your health care provider's instructions about healthy diet, exercising, and getting tested or screened for diseases.  Follow your health care provider's instructions on monitoring your cholesterol and blood pressure. This information is not intended to replace advice given to you by your health care provider. Make sure you discuss any questions you have with your health care provider. Document Released: 08/07/2010 Document Revised: 01/15/2018 Document Reviewed: 01/15/2018 Elsevier Patient Education  2020 [employer]     Why follow it? Research shows. . Those who follow the Mediterranean diet have a reduced risk of heart disease  . The diet is associated with a reduced incidence of Parkinson's and Alzheimer's diseases . People following the diet may have longer life expectancies and lower rates of chronic diseases  . The Dietary Guidelines for Americans recommends the Mediterranean diet as an eating plan to promote health and prevent disease  What Is the  Mediterranean Diet?  . Healthy eating plan based on typical foods and recipes of Mediterranean-style cooking . The diet is primarily a plant based diet; these foods should make up a majority of meals   Starches - Plant based foods should make up a majority of meals - They are an important sources of vitamins, minerals, energy, antioxidants, and fiber - Choose whole grains, foods high in fiber and minimally processed items  - Typical grain sources include wheat, oats, barley, corn, brown rice, bulgar, farro, millet, polenta, couscous  - Various types of beans include chickpeas, lentils, fava beans, black beans, white beans   Fruits  Veggies - Large quantities of antioxidant rich fruits & veggies; 6 or more servings  - Vegetables can be eaten raw or lightly drizzled with oil and cooked  - Vegetables common to the traditional Mediterranean Diet include: artichokes, arugula, beets, broccoli, brussel sprouts, cabbage, carrots, celery, collard greens, cucumbers, eggplant, kale, leeks, lemons, lettuce, mushrooms, okra, onions, peas, peppers, potatoes, pumpkin, radishes, rutabaga, shallots, spinach, sweet potatoes, turnips, zucchini - Fruits common to the Mediterranean Diet include: apples, apricots, avocados, cherries, clementines, dates, figs, grapefruits, grapes, melons, nectarines, oranges, peaches, pears, pomegranates, strawberries, tangerines  Fats - Replace butter and margarine with healthy oils, such as olive oil, canola oil, and tahini  - Limit nuts to no more than a handful a day  - Nuts include walnuts, almonds, pecans, pistachios, pine nuts  - Limit or avoid candied, honey roasted or heavily salted nuts - Olives are central to the Mediterranean diet - can be eaten whole or used in a variety of dishes   Meats Protein - Limiting red meat: no more than a few times a month - When eating red meat: choose lean cuts and keep the portion to the size of deck of cards - Eggs: approx. 0 to 4 times a  week  - Fish and lean poultry: at least 2 a week  - Healthy protein sources include, chicken, turkey, lean beef, lamb - Increase intake of seafood such as tuna, salmon, trout, mackerel, shrimp, scallops - Avoid or limit high fat processed meats such as sausage and bacon  Dairy - Include moderate amounts of low fat dairy products  - Focus on healthy dairy such as fat free yogurt, skim milk, low or reduced fat cheese - Limit dairy products higher in fat such as whole or 2% milk, cheese, ice cream  Alcohol - Moderate amounts of red wine is ok  - No more than 5 oz daily for women (all ages) and men older than age 65  -   No more than 10 oz of wine daily for men younger than 65  Other - Limit sweets and other desserts  - Use herbs and spices instead of salt to flavor foods  - Herbs and spices common to the traditional Mediterranean Diet include: basil, bay leaves, chives, cloves, cumin, fennel, garlic, lavender, marjoram, mint, oregano, parsley, pepper, rosemary, sage, savory, sumac, tarragon, thyme   It's not just a diet, it's a lifestyle:  . The Mediterranean diet includes lifestyle factors typical of those in the region  . Foods, drinks and meals are best eaten with others and savored . Daily physical activity is important for overall good health . This could be strenuous exercise like running and aerobics . This could also be more leisurely activities such as walking, housework, yard-work, or taking the stairs . Moderation is the key; a balanced and healthy diet accommodates most foods and drinks . Consider portion sizes and frequency of consumption of certain foods   Meal Ideas & Options:  . Breakfast:  o Whole wheat toast or whole wheat English muffins with peanut butter & hard boiled egg o Steel cut oats topped with apples & cinnamon and skim milk  o Fresh fruit: banana, strawberries, melon, berries, peaches  o Smoothies: strawberries, bananas, greek yogurt, peanut butter o Low fat  greek yogurt with blueberries and granola  o Egg white omelet with spinach and mushrooms o Breakfast couscous: whole wheat couscous, apricots, skim milk, cranberries  . Sandwiches:  o Hummus and grilled vegetables (peppers, zucchini, squash) on whole wheat bread   o Grilled chicken on whole wheat pita with lettuce, tomatoes, cucumbers or tzatziki  o Tuna salad on whole wheat bread: tuna salad made with greek yogurt, olives, red peppers, capers, green onions o Garlic rosemary lamb pita: lamb sauted with garlic, rosemary, salt & pepper; add lettuce, cucumber, greek yogurt to pita - flavor with lemon juice and black pepper  . Seafood:  o Mediterranean grilled salmon, seasoned with garlic, basil, parsley, lemon juice and black pepper o Shrimp, lemon, and spinach whole-grain pasta salad made with low fat greek yogurt  o Seared scallops with lemon orzo  o Seared tuna steaks seasoned salt, pepper, coriander topped with tomato mixture of olives, tomatoes, olive oil, minced garlic, parsley, green onions and cappers  . Meats:  o Herbed greek chicken salad with kalamata olives, cucumber, feta  o Red bell peppers stuffed with spinach, bulgur, lean ground beef (or lentils) & topped with feta   o Kebabs: skewers of chicken, tomatoes, onions, zucchini, squash  o Turkey burgers: made with red onions, mint, dill, lemon juice, feta cheese topped with roasted red peppers . Vegetarian o Cucumber salad: cucumbers, artichoke hearts, celery, red onion, feta cheese, tossed in olive oil & lemon juice  o Hummus and whole grain pita points with a greek salad (lettuce, tomato, feta, olives, cucumbers, red onion) o Lentil soup with celery, carrots made with vegetable broth, garlic, salt and pepper  o Tabouli salad: parsley, bulgur, mint, scallions, cucumbers, tomato, radishes, lemon juice, olive oil, salt and pepper.       Fat and Cholesterol Restricted Eating Plan Eating a diet that limits fat and cholesterol may  help lower your risk for heart disease and other conditions. Your body needs fat and cholesterol for basic functions, but eating too much of these things can be harmful to your health. Your health care provider may order lab tests to check your blood fat (lipid) and cholesterol levels. This helps your   health care provider understand your risk for certain conditions and whether you need to make diet changes. Work with your health care provider or dietitian to make an eating plan that is right for you. Your plan includes:  Limit your fat intake to ______% or less of your total calories a day.  Limit your saturated fat intake to ______% or less of your total calories a day.  Limit the amount of cholesterol in your diet to less than _________mg a day.  Eat ___________ g of fiber a day. What are tips for following this plan? General guidelines   If you are overweight, work with your health care provider to lose weight safely. Losing just 5-10% of your body weight can improve your overall health and help prevent diseases such as diabetes and heart disease.  Avoid: ? Foods with added sugar. ? Fried foods. ? Foods that contain partially hydrogenated oils, including stick margarine, some tub margarines, cookies, crackers, and other baked goods.  Limit alcohol intake to no more than 1 drink a day for nonpregnant women and 2 drinks a day for men. One drink equals 12 oz of beer, 5 oz of wine, or 1 oz of hard liquor. Reading food labels  Check food labels for: ? Trans fats, partially hydrogenated oils, or high amounts of saturated fat. Avoid foods that contain saturated fat and trans fat. ? The amount of cholesterol in each serving. Try to eat no more than 200 mg of cholesterol each day. ? The amount of fiber in each serving. Try to eat at least 20-30 g of fiber each day.  Choose foods with healthy fats, such as: ? Monounsaturated and polyunsaturated fats. These include olive and canola oil,  flaxseeds, walnuts, almonds, and seeds. ? Omega-3 fats. These are found in foods such as salmon, mackerel, sardines, tuna, flaxseed oil, and ground flaxseeds.  Choose grain products that have whole grains. Look for the word "whole" as the first word in the ingredient list. Cooking  Cook foods using methods other than frying. Baking, boiling, grilling, and broiling are some healthy options.  Eat more home-cooked food and less restaurant, buffet, and fast food.  Avoid cooking using saturated fats. ? Animal sources of saturated fats include meats, butter, and cream. ? Plant sources of saturated fats include palm oil, palm kernel oil, and coconut oil. Meal planning   At meals, imagine dividing your plate into fourths: ? Fill one-half of your plate with vegetables and green salads. ? Fill one-fourth of your plate with whole grains. ? Fill one-fourth of your plate with lean protein foods.  Eat fish that is high in omega-3 fats at least two times a week.  Eat more foods that contain fiber, such as whole grains, beans, apples, broccoli, carrots, peas, and barley. These foods help promote healthy cholesterol levels in the blood. Recommended foods Grains  Whole grains, such as whole wheat or whole grain breads, crackers, cereals, and pasta. Unsweetened oatmeal, bulgur, barley, quinoa, or brown rice. Corn or whole wheat flour tortillas. Vegetables  Fresh or frozen vegetables (raw, steamed, roasted, or grilled). Green salads. Fruits  All fresh, canned (in natural juice), or frozen fruits. Meats and other protein foods  Ground beef (85% or leaner), grass-fed beef, or beef trimmed of fat. Skinless chicken or turkey. Ground chicken or turkey. Pork trimmed of fat. All fish and seafood. Egg whites. Dried beans, peas, or lentils. Unsalted nuts or seeds. Unsalted canned beans. Natural nut butters without added sugar and oil. Dairy    Low-fat or nonfat dairy products, such as skim or 1% milk, 2% or  reduced-fat cheeses, low-fat and fat-free ricotta or cottage cheese, or plain low-fat and nonfat yogurt. Fats and oils  Tub margarine without trans fats. Light or reduced-fat mayonnaise and salad dressings. Avocado. Olive, canola, sesame, or safflower oils. The items listed above may not be a complete list of recommended foods or beverages. Contact your dietitian for more options. Foods to avoid Grains  White bread. White pasta. White rice. Cornbread. Bagels, pastries, and croissants. Crackers and snack foods that contain trans fat and hydrogenated oils. Vegetables  Vegetables cooked in cheese, cream, or butter sauce. Fried vegetables. Fruits  Canned fruit in heavy syrup. Fruit in cream or butter sauce. Fried fruit. Meats and other protein foods  Fatty cuts of meat. Ribs, chicken wings, bacon, sausage, bologna, salami, chitterlings, fatback, hot dogs, bratwurst, and packaged lunch meats. Liver and organ meats. Whole eggs and egg yolks. Chicken and turkey with skin. Fried meat. Dairy  Whole or 2% milk, cream, half-and-half, and cream cheese. Whole milk cheeses. Whole-fat or sweetened yogurt. Full-fat cheeses. Nondairy creamers and whipped toppings. Processed cheese, cheese spreads, and cheese curds. Beverages  Alcohol. Sugar-sweetened drinks such as sodas, lemonade, and fruit drinks. Fats and oils  Butter, stick margarine, lard, shortening, ghee, or bacon fat. Coconut, palm kernel, and palm oils. Sweets and desserts  Corn syrup, sugars, honey, and molasses. Candy. Jam and jelly. Syrup. Sweetened cereals. Cookies, pies, cakes, donuts, muffins, and ice cream. The items listed above may not be a complete list of foods and beverages to avoid. Contact your dietitian for more information. Summary  Your body needs fat and cholesterol for basic functions. However, eating too much of these things can be harmful to your health.  Work with your health care provider and dietitian to follow a  diet low in fat and cholesterol. Doing this may help lower your risk for heart disease and other conditions.  Choose healthy fats, such as monounsaturated and polyunsaturated fats, and foods high in omega-3 fatty acids.  Eat fiber-rich foods, such as whole grains, beans, peas, fruits, and vegetables.  Limit or avoid alcohol, fried foods, and foods high in saturated fats, partially hydrogenated oils, and sugar. This information is not intended to replace advice given to you by your health care provider. Make sure you discuss any questions you have with your health care provider. Document Released: 01/22/2005 Document Revised: 01/04/2017 Document Reviewed: 10/09/2016 Elsevier Patient Education  2020 [employer] Inc.  American Heart Association (AHA) Exercise Recommendation  Being physically active is important to prevent heart disease and stroke, the nation's No. 1and No. 5killers. To improve overall cardiovascular health, we suggest at least 150 minutes per week of moderate exercise or 75 minutes per week of vigorous exercise (or a combination of moderate and vigorous activity). Thirty minutes a day, five times a week is an easy goal to remember. You will also experience benefits even if you divide your time into two or three segments of 10 to 15 minutes per day.  For people who would benefit from lowering their blood pressure or cholesterol, we recommend 40 minutes of aerobic exercise of moderate to vigorous intensity three to four times a week to lower the risk for heart attack and stroke.  Physical activity is anything that makes you move your body and burn calories.  This includes things like climbing stairs or playing sports. Aerobic exercises benefit your heart, and include walking, jogging, swimming or biking. Strength and   stretching exercises are best for overall stamina and flexibility.  The simplest, positive change you can make to effectively improve your heart health is to start  walking. It's enjoyable, free, easy, social and great exercise. A walking program is flexible and boasts high success rates because people can stick with it. It's easy for walking to become a regular and satisfying part of life.   For Overall Cardiovascular Health:  At least 30 minutes of moderate-intensity aerobic activity at least 5 days per week for a total of 150  OR   At least 25 minutes of vigorous aerobic activity at least 3 days per week for a total of 75 minutes; or a combination of moderate- and vigorous-intensity aerobic activity  AND   Moderate- to high-intensity muscle-strengthening activity at least 2 days per week for additional health benefits.  For Lowering Blood Pressure and Cholesterol  An average 40 minutes of moderate- to vigorous-intensity aerobic activity 3 or 4 times per week  What if I can't make it to the time goal? Something is always better than nothing! And everyone has to start somewhere. Even if you've been sedentary for years, today is the day you can begin to make healthy changes in your life. If you don't think you'll make it for 30 or 40 minutes, set a reachable goal for today. You can work up toward your overall goal by increasing your time as you get stronger. Don't let all-or-nothing thinking rob you of doing what you can every day.  Source:http://www.heart.org    

## 2018-09-18 LAB — CBC
Hematocrit: 37.7 % (ref 34.0–46.6)
Hemoglobin: 12.3 g/dL (ref 11.1–15.9)
MCH: 27.2 pg (ref 26.6–33.0)
MCHC: 32.6 g/dL (ref 31.5–35.7)
MCV: 83 fL (ref 79–97)
Platelets: 374 10*3/uL (ref 150–450)
RBC: 4.53 x10E6/uL (ref 3.77–5.28)
RDW: 15.4 % (ref 11.7–15.4)
WBC: 2.7 10*3/uL — ABNORMAL LOW (ref 3.4–10.8)

## 2018-09-18 LAB — COMPREHENSIVE METABOLIC PANEL
ALT: 9 IU/L (ref 0–32)
AST: 16 IU/L (ref 0–40)
Albumin/Globulin Ratio: 1.3 (ref 1.2–2.2)
Albumin: 4.2 g/dL (ref 3.9–5.0)
Alkaline Phosphatase: 53 IU/L (ref 39–117)
BUN/Creatinine Ratio: 17 (ref 9–23)
BUN: 11 mg/dL (ref 6–20)
Bilirubin Total: 0.4 mg/dL (ref 0.0–1.2)
CO2: 20 mmol/L (ref 20–29)
Calcium: 9.1 mg/dL (ref 8.7–10.2)
Chloride: 106 mmol/L (ref 96–106)
Creatinine, Ser: 0.66 mg/dL (ref 0.57–1.00)
GFR calc Af Amer: 140 mL/min/{1.73_m2} (ref >59)
GFR calc non Af Amer: 121 mL/min/{1.73_m2} (ref >59)
Globulin, Total: 3.2 g/dL (ref 1.5–4.5)
Glucose: 82 mg/dL (ref 65–99)
Potassium: 4.3 mmol/L (ref 3.5–5.2)
Sodium: 141 mmol/L (ref 134–144)
Total Protein: 7.4 g/dL (ref 6.0–8.5)

## 2018-09-18 LAB — LIPID PANEL
Chol/HDL Ratio: 3.2 ratio (ref 0.0–4.4)
Cholesterol, Total: 159 mg/dL (ref 100–199)
HDL: 49 mg/dL (ref >39)
LDL Calculated: 101 mg/dL — ABNORMAL HIGH (ref 0–99)
Triglycerides: 47 mg/dL (ref 0–149)
VLDL Cholesterol Cal: 9 mg/dL (ref 5–40)

## 2018-09-18 LAB — HEMOGLOBIN A1C
Est. average glucose Bld gHb Est-mCnc: 100 mg/dL
Hgb A1c MFr Bld: 5.1 % (ref 4.8–5.6)

## 2018-09-18 LAB — HIV ANTIBODY (ROUTINE TESTING W REFLEX): HIV Screen 4th Generation wRfx: NONREACTIVE

## 2018-09-18 LAB — TSH: TSH: 1.13 u[IU]/mL (ref 0.450–4.500)

## 2018-09-18 LAB — RPR: RPR Ser Ql: NONREACTIVE

## 2018-09-18 NOTE — Progress Notes (Signed)
Hi Robin Mora, Your lab results are looking great at this time. The only note I would make is on the decrease in WBC (white blood cells). This is almost certainly benign, but we should consider repeating your CBC at your next visit next week to see if there's any trend to establish or at least to confirm this reading. If we have any further concerns after that next lab, we can discuss follow up from there. Like I said - almost certainly benign.

## 2018-09-19 ENCOUNTER — Other Ambulatory Visit: Payer: Self-pay | Admitting: Registered Nurse

## 2018-09-19 DIAGNOSIS — A599 Trichomoniasis, unspecified: Secondary | ICD-10-CM

## 2018-09-19 LAB — URINE CYTOLOGY ANCILLARY ONLY
Chlamydia: NEGATIVE
Neisseria Gonorrhea: NEGATIVE
Trichomonas: POSITIVE — AB

## 2018-09-19 LAB — NOVEL CORONAVIRUS, NAA: SARS-CoV-2, NAA: NOT DETECTED

## 2018-09-19 MED ORDER — METRONIDAZOLE 500 MG PO TABS: 500.0000 mg | ORAL_TABLET | Freq: Two times a day (BID) | ORAL | 0 refills | Status: DC

## 2018-09-19 NOTE — Progress Notes (Signed)
Calling in metronidazole for Trichomoniasis Metronidazole 500mg  PO bid for 7 days  Kathrin Ruddy, NP

## 2018-09-19 NOTE — Progress Notes (Signed)
Hello Ms. Robin Mora,  Your COVID-19 test is negative, as we were hoping for.   Kathrin Ruddy, NP

## 2018-09-22 ENCOUNTER — Ambulatory Visit (INDEPENDENT_AMBULATORY_CARE_PROVIDER_SITE_OTHER): Payer: BC Managed Care – PPO | Admitting: Registered Nurse

## 2018-09-22 ENCOUNTER — Other Ambulatory Visit (HOSPITAL_COMMUNITY)
Admission: RE | Admit: 2018-09-22 | Discharge: 2018-09-22 | Disposition: A | Discharge status: Home / Self Care | Payer: BC Managed Care – PPO | Source: Ambulatory Visit | Attending: Registered Nurse | Admitting: Registered Nurse

## 2018-09-22 ENCOUNTER — Encounter: Payer: Self-pay | Admitting: Registered Nurse

## 2018-09-22 ENCOUNTER — Other Ambulatory Visit: Payer: Self-pay

## 2018-09-22 VITALS — BP 111/70 | HR 72 | Temp 99.0°F | Resp 16 | Ht 64.0 in | Wt 182.0 lb

## 2018-09-22 DIAGNOSIS — A599 Trichomoniasis, unspecified: Secondary | ICD-10-CM

## 2018-09-22 DIAGNOSIS — Z01419 Encounter for gynecological examination (general) (routine) without abnormal findings: Secondary | ICD-10-CM

## 2018-09-22 DIAGNOSIS — N898 Other specified noninflammatory disorders of vagina: Secondary | ICD-10-CM

## 2018-09-22 LAB — POCT WET + KOH PREP
Trich by wet prep: ABSENT
Yeast by KOH: ABSENT
Yeast by wet prep: ABSENT

## 2018-09-22 MED ORDER — TINIDAZOLE 500 MG PO TABS: 2.0000 g | ORAL_TABLET | Freq: Every day | ORAL | 0 refills | Status: DC

## 2018-09-22 NOTE — Patient Instructions (Signed)
° ° ° °  If you have lab work done today you will be contacted with your lab results within the next 2 weeks.  If you have not heard from us then please contact us. The fastest way to get your results is to register for My Chart. ° ° °IF you received an x-ray today, you will receive an invoice from [facility]. Please contact [facility] at [phone] with questions or concerns regarding your invoice.  ° °IF you received labwork today, you will receive an invoice from [facility]. Please contact [facility] at [phone] with questions or concerns regarding your invoice.  ° °Our billing staff will not be able to assist you with questions regarding bills from these companies. ° °You will be contacted with the lab results as soon as they are available. The fastest way to get your results is to activate your My Chart account. Instructions are located on the last page of this paperwork. If you have not heard from us regarding the results in 2 weeks, please contact this office. °  ° ° ° °

## 2018-09-22 NOTE — Progress Notes (Signed)
Established Patient Office Visit  Subjective:  Patient ID: Robin Mora, female    DOB: 01/26/1991  Age: 28 y.o. MRN: [id number]  CC:  Chief Complaint  Patient presents with  . Gynecologic Exam    HPI Robin AnnaSaadia Eggebrecht presents for pap only visit.  Presented last week for CPE, unfortunately was menstruating. Returns today for routine pap. Notes some vaginal discharge. Was dx with trichomoniasis last week at visit, unfortunately, cannot tolerate metronidazole due to GI side effects. Tinidazole 2g single dose will be given in it's place today.  Otherwise, no further concerns at this time.   Past Medical History:  Diagnosis Date  . Ulcerative colitis (HCC)     History reviewed. No pertinent surgical history.  Family History  Problem Relation Age of Onset  . Hyperlipidemia Father   . Hypertension Father   . Sleep apnea Father   . Diabetes Maternal Grandmother   . Diabetes Paternal Grandmother   . Hypertension Paternal Grandmother   . Heart disease Paternal Grandfather   . Hypertension Paternal Grandfather   . Kidney disease Paternal Grandfather   . Diabetes Paternal Grandfather     Social History   Socioeconomic History  . Marital status: Single    Spouse name: n/a  . Number of children: 0  . Years of education: college  . Highest education level: Not on file  Occupational History  . Occupation: Runner, broadcasting/film/videoTeacher    Comment: [employer]  Social Needs  . Financial resource strain: Not on file  . Food insecurity    Worry: Not on file    Inability: Not on file  . Transportation needs    Medical: Not on file    Non-medical: Not on file  Tobacco Use  . Smoking status: Never Smoker  . Smokeless tobacco: Never Used  Substance and Sexual Activity  . Alcohol use: No    Alcohol/week: 0.0 standard drinks  . Drug use: No  . Sexual activity: Yes    Partners: Male    Birth control/protection: Condom    Comment: inconsistent condom use  Lifestyle  . Physical activity   Days per week: Not on file    Minutes per session: Not on file  . Stress: Not on file  Relationships  . Social Musicianconnections    Talks on phone: Not on file    Gets together: Not on file    Attends religious service: Not on file    Active member of club or organization: Not on file    Attends meetings of clubs or organizations: Not on file    Relationship status: Not on file  . Intimate partner violence    Fear of current or ex partner: Not on file    Emotionally abused: Not on file    Physically abused: Not on file    Forced sexual activity: Not on file  Other Topics Concern  . Not on file  Social History Narrative   In graduate program at [employer] and anticipates a Master's Degree in [place] and [place] American Literature 06/2015. Her undergraduate degree in [employer] is from [employer].   Lives with her sister.    Outpatient Medications Prior to Visit  Medication Sig Dispense Refill  . balsalazide (COLAZAL) 750 MG capsule Take 3 capsules (2,250 mg total) by mouth 3 (three) times daily. 270 capsule 3  . cetirizine (ZYRTEC) 10 MG tablet Take 1 tablet (10 mg total) by mouth daily. 90 tablet 3  . fluticasone (FLONASE) 50 MCG/ACT nasal spray Place 2  sprays into both nostrils daily. 16 g 6  . metroNIDAZOLE (FLAGYL) 500 MG tablet Take 1 tablet (500 mg total) by mouth 2 (two) times daily. 14 tablet 0  . nitrofurantoin, macrocrystal-monohydrate, (MACROBID) 100 MG capsule Take 1 capsule (100 mg total) by mouth 2 (two) times daily for 5 days. 10 capsule 0   No facility-administered medications prior to visit.     No Known Allergies  ROS Review of Systems  Constitutional: Negative.   HENT: Negative.   Eyes: Negative.   Respiratory: Negative.   Cardiovascular: Negative.   Gastrointestinal: Negative.   Endocrine: Negative.   Genitourinary: Positive for vaginal discharge. Negative for vaginal bleeding and vaginal pain.  Musculoskeletal: Negative.   Skin: Negative.    Allergic/Immunologic: Negative.   Neurological: Negative.   Hematological: Negative.   Psychiatric/Behavioral: Negative.   All other systems reviewed and are negative.     Objective:    Physical Exam  Constitutional: She is oriented to person, place, and time. She appears well-developed and well-nourished. No distress.  Cardiovascular: Normal rate and regular rhythm.  Pulmonary/Chest: Effort normal. No respiratory distress.  Abdominal: Hernia confirmed negative in the right inguinal area and confirmed negative in the left inguinal area.  Genitourinary:    Uterus normal.  No labial fusion. There is no rash, tenderness, lesion or injury on the right labia. There is no rash, tenderness, lesion or injury on the left labia.    Vaginal discharge present.     No vaginal erythema, tenderness or bleeding.  No erythema, tenderness or bleeding in the vagina.    No foreign body in the vagina.     No signs of injury in the vagina.   Lymphadenopathy:       Right: No inguinal adenopathy present.       Left: No inguinal adenopathy present.  Neurological: She is alert and oriented to person, place, and time.  Skin: Skin is warm and dry. No rash noted. She is not diaphoretic. No erythema. No pallor.  Psychiatric: She has a normal mood and affect. Her behavior is normal. Judgment and thought content normal.  Nursing note and vitals reviewed.   BP 111/70   Pulse 72   Temp 99 F (37.2 C) (Oral)   Resp 16   Ht 5\' 4"  (1.626 m)   Wt 182 lb (82.6 kg)   LMP 09/17/2018   SpO2 98%   BMI 31.24 kg/m  Wt Readings from Last 3 Encounters:  09/22/18 182 lb (82.6 kg)  09/17/18 181 lb (82.1 kg)  04/18/18 171 lb (77.6 kg)     Health Maintenance Due  Topic Date Due  . INFLUENZA VACCINE  09/06/2018    There are no preventive care reminders to display for this patient.  Lab Results  Component Value Date   TSH 1.130 09/17/2018   Lab Results  Component Value Date   WBC 2.7 (L) 09/17/2018   HGB  12.3 09/17/2018   HCT 37.7 09/17/2018   MCV 83 09/17/2018   PLT 374 09/17/2018   Lab Results  Component Value Date   NA 141 09/17/2018   K 4.3 09/17/2018   CO2 20 09/17/2018   GLUCOSE 82 09/17/2018   BUN 11 09/17/2018   CREATININE 0.66 09/17/2018   BILITOT 0.4 09/17/2018   ALKPHOS 53 09/17/2018   AST 16 09/17/2018   ALT 9 09/17/2018   PROT 7.4 09/17/2018   ALBUMIN 4.2 09/17/2018   CALCIUM 9.1 09/17/2018   ANIONGAP 7 07/02/2016   Lab Results  Component Value Date   CHOL 159 09/17/2018   Lab Results  Component Value Date   HDL 49 09/17/2018   Lab Results  Component Value Date   LDLCALC 101 (H) 09/17/2018   Lab Results  Component Value Date   TRIG 47 09/17/2018   Lab Results  Component Value Date   CHOLHDL 3.2 09/17/2018   Lab Results  Component Value Date   HGBA1C 5.1 09/17/2018      Assessment & Plan:   Problem List Items Addressed This Visit    None    Visit Diagnoses    Encounter for gynecological examination without abnormal finding    -  Primary   Relevant Orders   Cytology - PAP([place])   Vaginal discharge       Relevant Orders   POCT Wet + KOH Prep   Trichomoniasis       Relevant Medications   tinidazole (TINDAMAX) 500 MG tablet      Meds ordered this encounter  Medications  . tinidazole (TINDAMAX) 500 MG tablet    Sig: Take 4 tablets (2,000 mg total) by mouth daily with breakfast.    Dispense:  4 tablet    Refill:  0    Order Specific Question:   Supervising Provider    Answer:   Forrest Moron [id number]    Follow-up: No follow-ups on file.   PLAN  Will follow up with lab results as warranted  Pt to return to clinic in 1 year for CPE and labs  Patient encouraged to call clinic with any questions, comments, or concerns.   Maximiano Coss, NP

## 2018-09-23 ENCOUNTER — Encounter: Payer: Self-pay | Admitting: Registered Nurse

## 2018-09-23 LAB — CYTOLOGY - PAP: Diagnosis: NEGATIVE

## 2018-09-23 NOTE — Progress Notes (Signed)
Pap result letter sent to patient.  Kathrin Ruddy, NP

## 2018-09-24 ENCOUNTER — Encounter: Payer: Self-pay | Admitting: Registered Nurse

## 2018-10-13 ENCOUNTER — Telehealth: Payer: BC Managed Care – PPO | Admitting: Nurse Practitioner

## 2018-10-13 DIAGNOSIS — H5789 Other specified disorders of eye and adnexa: Secondary | ICD-10-CM

## 2018-10-13 MED ORDER — POLYMYXIN B-TRIMETHOPRIM 10000-0.1 UNIT/ML-% OP SOLN: 2.0000 [drp] | OPHTHALMIC | 0 refills | Status: DC

## 2018-10-13 NOTE — Progress Notes (Signed)
We are sorry that you are not feeling well.  Here is how we plan to help!  Based on what you have shared with me it looks like you have conjunctivitis.  Conjunctivitis is a common inflammatory or infectious condition of the eye that is often referred to as "pink eye".  In most cases it is contagious (viral or bacterial). However, not all conjunctivitis requires antibiotics (ex. Allergic).  We have made appropriate suggestions for you based upon your presentation.  I have prescribed Polytrim Ophthalmic drops 1-2 drops 4 times a day times 5 days  Pink eye can be highly contagious.  It is typically spread through direct contact with secretions, or contaminated objects or surfaces that one may have touched.  Strict handwashing is suggested with soap and water is urged.  If not available, use alcohol based had sanitizer.  Avoid unnecessary touching of the eye.  If you wear contact lenses, you will need to refrain from wearing them until you see no white discharge from the eye for at least 24 hours after being on medication.  You should see symptom improvement in 1-2 days after starting the medication regimen.  Call us if symptoms are not improved in 1-2 days.  Home Care:  Wash your hands often!  Do not wear your contacts until you complete your treatment plan.  Avoid sharing towels, bed linen, personal items with a person who has pink eye.  See attention for anyone in your home with similar symptoms.  Get Help Right Away If:  Your symptoms do not improve.  You develop blurred or loss of vision.  Your symptoms worsen (increased discharge, pain or redness)  Your e-visit answers were reviewed by a board certified advanced clinical practitioner to complete your personal care plan.  Depending on the condition, your plan could have included both over the counter or prescription medications.  If there is a problem please reply  once you have received a response from your provider.  Your safety is  important to us.  If you have drug allergies check your prescription carefully.    You can use MyChart to ask questions about today's visit, request a non-urgent call back, or ask for a work or school excuse for 24 hours related to this e-Visit. If it has been greater than 24 hours you will need to follow up with your provider, or enter a new e-Visit to address those concerns.   You will get an e-mail in the next two days asking about your experience.  I hope that your e-visit has been valuable and will speed your recovery. Thank you for using e-visits.   5-10 minutes spent reviewing and documenting in chart.   

## 2018-10-30 ENCOUNTER — Ambulatory Visit: Payer: Self-pay | Admitting: Family Medicine

## 2018-10-30 NOTE — Telephone Encounter (Signed)
Pt needs appt

## 2018-10-30 NOTE — Telephone Encounter (Signed)
Pt reports dysuria, frequency, urgency. Also reports mild lower back pain and pressure at bladder area. States afebrile. Treated for UTI 09/17/2018; tested positive for trichomonas at that time also. Reports vaginal discharge, white, no odor, denies itching, no rash but "Irritated area." TN attempted to reach practice for consideration of appt. On hold extended period of time. Please advise pt: [phone]  Reason for Disposition . Urinating more frequently than usual (i.e., frequency)  Answer Assessment - Initial Assessment Questions 1. SYMPTOM: "What's the main symptom you're concerned about?" (e.g., frequency, incontinence)     Dysuria, frequency, urgency, bladder pressure 2. ONSET: "When did the  *No Answer*  start?"     Days ago 3. PAIN: "Is there any pain?" If so, ask: "How bad is it?" (Scale: 1-10; mild, moderate, severe)    Yes mild 4. CAUSE: "What do you think is causing the symptoms?"     UTI, maybe yeast too 5. OTHER SYMPTOMS: "Do you have any other symptoms?" (e.g., fever, flank pain, blood in urine, pain with urination)    Lower back pain 6. PREGNANCY: "Is there any chance you are pregnant?" "When was your last menstrual period?"  Protocols used: URINARY [facility]

## 2018-11-04 NOTE — Telephone Encounter (Signed)
LVM to schedule appt

## 2018-11-10 ENCOUNTER — Encounter: Payer: Self-pay | Admitting: Family Medicine

## 2018-11-12 ENCOUNTER — Encounter: Payer: BC Managed Care – PPO | Admitting: Family Medicine

## 2018-11-14 ENCOUNTER — Other Ambulatory Visit: Payer: Self-pay

## 2018-11-14 DIAGNOSIS — Z20822 Contact with and (suspected) exposure to covid-19: Secondary | ICD-10-CM

## 2018-11-16 LAB — NOVEL CORONAVIRUS, NAA: SARS-CoV-2, NAA: NOT DETECTED

## 2018-12-05 ENCOUNTER — Other Ambulatory Visit: Payer: Self-pay

## 2018-12-05 DIAGNOSIS — Z20822 Contact with and (suspected) exposure to covid-19: Secondary | ICD-10-CM

## 2018-12-06 LAB — NOVEL CORONAVIRUS, NAA: SARS-CoV-2, NAA: NOT DETECTED

## 2018-12-07 ENCOUNTER — Telehealth: Payer: BC Managed Care – PPO | Admitting: Physician Assistant

## 2018-12-07 DIAGNOSIS — N898 Other specified noninflammatory disorders of vagina: Secondary | ICD-10-CM

## 2018-12-07 NOTE — Progress Notes (Signed)
Based on what you shared with me, I feel your condition warrants further evaluation and I recommend that you be seen for a face to face office visit. Given the nature of your discharge and also the abdominal pain you will need in person testing to identify what is causing your discharge.   NOTE: If you entered your credit card information for this eVisit, you will not be charged. You may see a "hold" on your card for the $35 but that hold will drop off and you will not have a charge processed.  If you are having a true medical emergency please call 911.     For an urgent face to face visit, [facility] has four urgent care centers for your convenience:   . [facility]    [phone]                  Get Driving Directions  [address], [place] [address] . 10 am to 8 pm Monday-Friday . 12 pm to 8 pm Saturday-Sunday   . [facility] [place]                  Get Driving Directions  [address], [place], [place] [address] . 8 am to 8 pm Monday-Friday . 9 am to 6 pm Saturday . 11 am to 6 pm Sunday   . [facility] at [place]                  Get Driving Directions   [address], [place] [address] . 8 am to 8 pm Monday-Friday . 8 am to 4 pm Saturday-Sunday    . [facility] at [place]                    Get Driving Directions  [phone]  [address] [place], [place] [address]  . Monday-Friday, 12 PM to 6 PM    Your e-visit answers were reviewed by a board certified advanced clinical practitioner to complete your personal care plan.  Thank you for using e-Visits.

## 2019-01-13 ENCOUNTER — Encounter: Payer: Self-pay | Admitting: Registered Nurse

## 2019-01-13 ENCOUNTER — Ambulatory Visit: Payer: BC Managed Care – PPO | Admitting: Registered Nurse

## 2019-01-13 ENCOUNTER — Other Ambulatory Visit: Payer: Self-pay

## 2019-01-13 VITALS — BP 108/66 | HR 64 | Temp 98.6°F | Resp 12 | Ht 64.5 in | Wt 182.0 lb

## 2019-01-13 DIAGNOSIS — N390 Urinary tract infection, site not specified: Secondary | ICD-10-CM

## 2019-01-13 DIAGNOSIS — B373 Candidiasis of vulva and vagina: Secondary | ICD-10-CM | POA: Diagnosis not present

## 2019-01-13 DIAGNOSIS — B3731 Acute candidiasis of vulva and vagina: Secondary | ICD-10-CM

## 2019-01-13 LAB — POCT URINALYSIS DIP (CLINITEK)
Bilirubin, UA: NEGATIVE
Blood, UA: NEGATIVE
Glucose, UA: NEGATIVE mg/dL
Ketones, POC UA: NEGATIVE mg/dL
Leukocytes, UA: NEGATIVE
Nitrite, UA: NEGATIVE
POC PROTEIN,UA: 30 — AB
Spec Grav, UA: 1.025 (ref 1.010–1.025)
Urobilinogen, UA: 0.2 E.U./dL
pH, UA: 7 (ref 5.0–8.0)

## 2019-01-13 MED ORDER — FLUCONAZOLE 150 MG PO TABS: 150.0000 mg | ORAL_TABLET | Freq: Once | ORAL | 0 refills | Status: AC

## 2019-01-13 NOTE — Progress Notes (Signed)
Acute Office Visit  Subjective:    Patient ID: Robin Mora, female    DOB: 04-10-1990, 28 y.o.   MRN: [id number]  Chief Complaint  Patient presents with  . Urinary Tract Infection    pt stated--odor, thick/cheesy discharge, itching, --1 month. re-current in Nov. Denied fever.    HPI Patient is in today for concern for GU infection  Notes that she has had a history of UTIs - requests urine dipstick  Reports symptoms have progressed in preceding weeks. She notes curd like discharge, odor, and itching. She had recently completed two rounds of antibiotics.  She reports that she had had yeast infections result from abx use in the past, she believes that this has happened before.  Past Medical History:  Diagnosis Date  . Endometriosis   . Ulcerative colitis ([place])     History reviewed. No pertinent surgical history.  Family History  Problem Relation Age of Onset  . Hyperlipidemia Father   . Hypertension Father   . Sleep apnea Father   . Diabetes Maternal Grandmother   . Diabetes Paternal Grandmother   . Hypertension Paternal Grandmother   . Heart disease Paternal Grandfather   . Hypertension Paternal Grandfather   . Kidney disease Paternal Grandfather   . Diabetes Paternal Grandfather     Social History   Socioeconomic History  . Marital status: Single    Spouse name: n/a  . Number of children: 0  . Years of education: college  . Highest education level: Not on file  Occupational History  . 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 Financial resource strain: Not on file  . Food insecurity    Worry: Not on file    Inability: Not on file  . Transportation needs    Medical: Not on file    Non-medical: Not on file  Tobacco Use  . Smoking status: Never Smoker  . Smokeless tobacco: Never Used  Substance and Sexual Activity  . Alcohol use: Yes    Alcohol/week: 0.0 standard drinks    Comment: occ  . Drug use: No  . Sexual activity: Yes     Partners: Male    Birth control/protection: Condom    Comment: inconsistent condom use  Lifestyle  . Physical activity    Days per week: Not on file    Minutes per session: Not on file  . Stress: Not on file  Relationships  . Social Herbalist on phone: Not on file    Gets together: Not on file    Attends religious service: Not on file    Active member of club or organization: Not on file    Attends meetings of clubs or organizations: Not on file    Relationship status: Not on file  . Intimate partner violence    Fear of current or ex partner: Not on file    Emotionally abused: Not on file    Physically abused: Not on file    Forced sexual activity: Not on file  Other Topics Concern  . Not on file  Social History Narrative   In graduate program at [employer] and anticipates a Master's Degree in [place] and [place] American Literature 06/2015. Her undergraduate degree in [employer] is from [employer].   Lives with her sister.    Outpatient Medications Prior to Visit  Medication Sig Dispense Refill  . cetirizine (ZYRTEC) 10 MG tablet Take 1 tablet (10 mg total) by mouth daily. [place]  tablet 3  . fluticasone (FLONASE) 50 MCG/ACT nasal spray Place 2 sprays into both nostrils daily. 16 g 6  . balsalazide (COLAZAL) 750 MG capsule Take 3 capsules (2,250 mg total) by mouth 3 (three) times daily. 270 capsule 3  . metroNIDAZOLE (FLAGYL) 500 MG tablet Take 1 tablet (500 mg total) by mouth 2 (two) times daily. 14 tablet 0  . tinidazole (TINDAMAX) 500 MG tablet Take 4 tablets (2,000 mg total) by mouth daily with breakfast. 4 tablet 0  . trimethoprim-polymyxin b (POLYTRIM) ophthalmic solution Place 2 drops into both eyes every 4 (four) hours. 10 mL 0   No facility-administered medications prior to visit.     No Known Allergies  Review of Systems  Constitutional: Negative.   HENT: Negative.   Eyes: Negative.   Respiratory: Negative.   Cardiovascular: Negative.    Gastrointestinal: Negative.   Genitourinary: Negative.   Musculoskeletal: Negative.   Skin: Negative.   Neurological: Negative.   Endo/Heme/Allergies: Negative.   Psychiatric/Behavioral: Negative.   All other systems reviewed and are negative.      Objective:    Physical Exam  Constitutional: She is oriented to person, place, and time. She appears well-developed and well-nourished. No distress.  Cardiovascular: Normal rate and regular rhythm.  Pulmonary/Chest: Effort normal. No respiratory distress.  Neurological: She is alert and oriented to person, place, and time.  Skin: Skin is warm and dry. No rash noted. She is not diaphoretic. No erythema. No pallor.  Psychiatric: She has a normal mood and affect. Her behavior is normal. Judgment and thought content normal.  Nursing note and vitals reviewed.   BP 108/66   Pulse 64   Temp 98.6 F (37 C)   Resp 12   Ht 5' 4.5" (1.638 m)   Wt 182 lb (82.6 kg)   LMP 01/04/2019   SpO2 100%   BMI 30.76 kg/m  Wt Readings from Last 3 Encounters:  01/13/19 182 lb (82.6 kg)  09/22/18 182 lb (82.6 kg)  09/17/18 181 lb (82.1 kg)    Health Maintenance Due  Topic Date Due  . INFLUENZA VACCINE  09/06/2018    There are no preventive care reminders to display for this patient.   Lab Results  Component Value Date   TSH 1.130 09/17/2018   Lab Results  Component Value Date   WBC 2.7 (L) 09/17/2018   HGB 12.3 09/17/2018   HCT 37.7 09/17/2018   MCV 83 09/17/2018   PLT 374 09/17/2018   Lab Results  Component Value Date   NA 141 09/17/2018   K 4.3 09/17/2018   CO2 20 09/17/2018   GLUCOSE 82 09/17/2018   BUN 11 09/17/2018   CREATININE 0.66 09/17/2018   BILITOT 0.4 09/17/2018   ALKPHOS 53 09/17/2018   AST 16 09/17/2018   ALT 9 09/17/2018   PROT 7.4 09/17/2018   ALBUMIN 4.2 09/17/2018   CALCIUM 9.1 09/17/2018   ANIONGAP 7 07/02/2016   Lab Results  Component Value Date   CHOL 159 09/17/2018   Lab Results  Component Value  Date   HDL 49 09/17/2018   Lab Results  Component Value Date   LDLCALC 101 (H) 09/17/2018   Lab Results  Component Value Date   TRIG 47 09/17/2018   Lab Results  Component Value Date   CHOLHDL 3.2 09/17/2018   Lab Results  Component Value Date   HGBA1C 5.1 09/17/2018       Assessment & Plan:   Problem List Items Addressed This Visit  None    Visit Diagnoses    Yeast vaginitis    -  Primary   Relevant Medications   fluconazole (DIFLUCAN) 150 MG tablet   Other Relevant Orders   POCT URINALYSIS DIP (CLINITEK) (Completed)   Urinary tract infection without hematuria, site unspecified       Relevant Medications   fluconazole (DIFLUCAN) 150 MG tablet   Other Relevant Orders   POCT URINALYSIS DIP (CLINITEK) (Completed)       Meds ordered this encounter  Medications  . fluconazole (DIFLUCAN) 150 MG tablet    Sig: Take 1 tablet (150 mg total) by mouth once for 1 dose.    Dispense:  3 tablet    Refill:  0    Order Specific Question:   Supervising Provider    Answer:   Doristine Bosworth [id number]   PLAN  Pt declined pelvic exam - however, given recent abx use, I feel fine treating presumptively for candidal vaginal infection given patient's history  Patient encouraged to call clinic with any questions, comments, or concerns.   Janeece Agee, NP

## 2019-03-30 ENCOUNTER — Other Ambulatory Visit: Payer: Self-pay

## 2019-03-30 ENCOUNTER — Ambulatory Visit: Payer: BC Managed Care – PPO | Admitting: Family Medicine

## 2019-03-30 ENCOUNTER — Encounter: Payer: Self-pay | Admitting: Family Medicine

## 2019-03-30 VITALS — BP 112/68 | HR 74 | Temp 98.0°F | Ht 64.0 in | Wt 188.4 lb

## 2019-03-30 DIAGNOSIS — N898 Other specified noninflammatory disorders of vagina: Secondary | ICD-10-CM | POA: Diagnosis not present

## 2019-03-30 NOTE — Patient Instructions (Addendum)
[facility] Probiotic pH-D Feminine Health Support Boric Acid Vaginal Suppository    If you have lab work done today you will be contacted with your lab results within the next 2 weeks.  If you have not heard from [place] then please contact us. The fastest way to get your results is to register for My Chart.   IF you received an x-ray today, you will receive an invoice from [facility]. Please contact [facility] at [phone] with questions or concerns regarding your invoice.   IF you received labwork today, you will receive an invoice from [facility]. Please contact [facility] at [phone] with questions or concerns regarding your invoice.   Our billing staff will not be able to assist you with questions regarding bills from these companies.  You will be contacted with the lab results as soon as they are available. The fastest way to get your results is to activate your My Chart account. Instructions are located on the last page of this paperwork. If you have not heard from [place] regarding the results in 2 weeks, please contact this office.      Vaginitis Vaginitis is a condition in which the vaginal tissue swells and becomes red (inflamed). This condition is most often caused by a change in the normal balance of bacteria and yeast that live in the vagina. This change causes an overgrowth of certain bacteria or yeast, which causes the inflammation. There are different types of vaginitis, but the most common types are:  Bacterial vaginosis.  Yeast infection (candidiasis).  Trichomoniasis vaginitis. This is a sexually transmitted disease (STD).  Viral vaginitis.  Atrophic vaginitis.  Allergic vaginitis. What are the causes? The cause of this condition depends on the type of vaginitis. It can be caused by:  Bacteria (bacterial vaginosis).  Yeast, which is a fungus (yeast infection).  A parasite (trichomoniasis vaginitis).  A virus (viral  vaginitis).  Low hormone levels (atrophic vaginitis). Low hormone levels can occur during pregnancy, breastfeeding, or after menopause.  Irritants, such as bubble baths, scented tampons, and feminine sprays (allergic vaginitis). Other factors can change the normal balance of the yeast and bacteria that live in the vagina. These include:  Antibiotic medicines.  Poor hygiene.  Diaphragms, vaginal sponges, spermicides, birth control pills, and intrauterine devices (IUD).  Sex.  Infection.  Uncontrolled diabetes.  A weakened defense (immune) system. What increases the risk? This condition is more likely to develop in women who:  Smoke.  Use vaginal douches, scented tampons, or scented sanitary pads.  Wear tight-fitting pants.  Wear thong underwear.  Use oral birth control pills or an IUD.  Have sex without a condom.  Have multiple sex partners.  Have an STD.  Frequently use the spermicide nonoxynol-9.  Eat lots of foods high in sugar.  Have uncontrolled diabetes.  Have low estrogen levels.  Have a weakened immune system from an immune disorder or medical treatment.  Are pregnant or breastfeeding. What are the signs or symptoms? Symptoms vary depending on the cause of the vaginitis. Common symptoms include:  Abnormal vaginal discharge. ? The discharge is white, gray, or yellow with bacterial vaginosis. ? The discharge is thick, white, and cheesy with a yeast infection. ? The discharge is frothy and yellow or greenish with trichomoniasis.  A bad vaginal smell. The smell is fishy with bacterial vaginosis.  Vaginal itching, pain, or swelling.  Sex that is painful.  Pain or burning when urinating. Sometimes there are no symptoms. How is this  diagnosed? This condition is diagnosed based on your symptoms and medical history. A physical exam, including a pelvic exam, will also be done. You may also have other tests, including:  Tests to determine the pH level  (acidity or alkalinity) of your vagina.  A whiff test, to assess the odor that results when a sample of your vaginal discharge is mixed with a potassium hydroxide solution.  Tests of vaginal fluid. A sample will be examined under a microscope. How is this treated? Treatment varies depending on the type of vaginitis you have. Your treatment may include:  Antibiotic creams or pills to treat bacterial vaginosis and trichomoniasis.  Antifungal medicines, such as vaginal creams or suppositories, to treat a yeast infection.  Medicine to ease discomfort if you have viral vaginitis. Your sexual partner should also be treated.  Estrogen delivered in a cream, pill, suppository, or vaginal ring to treat atrophic vaginitis. If vaginal dryness occurs, lubricants and moisturizing creams may help. You may need to avoid scented soaps, sprays, or douches.  Stopping use of a product that is causing allergic vaginitis. Then using a vaginal cream to treat the symptoms. Follow these instructions at home: Lifestyle  Keep your genital area clean and dry. Avoid soap, and only rinse the area with water.  Do not douche or use tampons until your health care provider says it is okay to do so. Use sanitary pads, if needed.  Do not have sex until your health care provider approves. When you can return to sex, practice safe sex and use condoms.  Wipe from front to back. This avoids the spread of bacteria from the rectum to the vagina. General instructions  Take over-the-counter and prescription medicines only as told by your health care provider.  If you were prescribed an antibiotic medicine, take or use it as told by your health care provider. Do not stop taking or using the antibiotic even if you start to feel better.  Keep all follow-up visits as told by your health care provider. This is important. How is this prevented?  Use mild, non-scented products. Do not use things that can irritate the vagina, such  as fabric softeners. Avoid the following products if they are scented: ? Feminine sprays. ? Detergents. ? Tampons. ? Feminine hygiene products. ? Soaps or bubble baths.  Let air reach your genital area. ? Wear cotton underwear to reduce moisture buildup. ? Avoid wearing underwear while you sleep. ? Avoid wearing tight pants and underwear or nylons without a cotton panel. ? Avoid wearing thong underwear.  Take off any wet clothing, such as bathing suits, as soon as possible.  Practice safe sex and use condoms. Contact a health care provider if:  You have abdominal pain.  You have a fever.  You have symptoms that last for more than 2-3 days. Get help right away if:  You have a fever and your symptoms suddenly get worse. Summary  Vaginitis is a condition in which the vaginal tissue becomes inflamed.This condition is most often caused by a change in the normal balance of bacteria and yeast that live in the vagina.  Treatment varies depending on the type of vaginitis you have.  Do not douche, use tampons , or have sex until your health care provider approves. When you can return to sex, practice safe sex and use condoms. This information is not intended to replace advice given to you by your health care provider. Make sure you discuss any questions you have with your health  care provider. Document Revised: 01/04/2017 Document Reviewed: 02/28/2016 Elsevier Patient Education  2020 [employer].

## 2019-03-30 NOTE — Progress Notes (Signed)
Established Patient Office Visit  Subjective:  Patient ID: Robin Mora, female    DOB: [birth year]  Age: 29 y.o. MRN: [id number]  CC:  Chief Complaint  Patient presents with  . Vaginal Discharge    a couple of months with and without odor    HPI Jennings Stirling presents for   Pt reports that she has taken antifungal and antibiotics  She reports that she has done rounds of these meds since October 2020 She states that she goes to her OB/gyn No dysuria, no back pain or flank pain Std testing has been negative No fevers or chills  No vaginal rashes  Past Medical History:  Diagnosis Date  . Endometriosis   . Ulcerative colitis (HCC)     No past surgical history on file.  Family History  Problem Relation Age of Onset  . Hyperlipidemia Father   . Hypertension Father   . Sleep apnea Father   . Diabetes Maternal Grandmother   . Diabetes Paternal Grandmother   . Hypertension Paternal Grandmother   . Heart disease Paternal Grandfather   . Hypertension Paternal Grandfather   . Kidney disease Paternal Grandfather   . Diabetes Paternal Grandfather     Social History   Socioeconomic History  . Marital status: Single    Spouse name: n/a  . Number of children: 0  . Years of education: college  . Highest education level: Not on file  Occupational History  . Occupation: Teacher    Comment: Administrator  Tobacco Use  . Smoking status: Never Smoker  . Smokeless tobacco: Never Used  Substance and Sexual Activity  . Alcohol use: Yes    Alcohol/week: 0.0 standard drinks    Comment: occ  . Drug use: No  . Sexual activity: Yes    Partners: Male    Birth control/protection: Condom    Comment: inconsistent condom use  Other Topics Concern  . Not on file  Social History Narrative   In graduate program at [employer] and anticipates a Master's Degree in [place] and [place] American Literature 06/2015. Her undergraduate degree in [employer] is from [employer].   Lives with  her sister.   Social Determinants of Health   Financial Resource Strain:   . Difficulty of Paying Living Expenses: Not on file  Food Insecurity:   . Worried About Programme researcher, broadcasting/film/video in the Last Year: Not on file  . Ran Out of Food in the Last Year: Not on file  Transportation Needs:   . Lack of Transportation (Medical): Not on file  . Lack of Transportation (Non-Medical): Not on file  Physical Activity:   . Days of Exercise per Week: Not on file  . Minutes of Exercise per Session: Not on file  Stress:   . Feeling of Stress : Not on file  Social Connections:   . Frequency of Communication with Friends and Family: Not on file  . Frequency of Social Gatherings with Friends and Family: Not on file  . Attends Religious Services: Not on file  . Active Member of Clubs or Organizations: Not on file  . Attends Banker Meetings: Not on file  . Marital Status: Not on file  Intimate Partner Violence:   . Fear of Current or Ex-Partner: Not on file  . Emotionally Abused: Not on file  . Physically Abused: Not on file  . Sexually Abused: Not on file    Outpatient Medications Prior to Visit  Medication Sig Dispense Refill  . cetirizine (  ZYRTEC) 10 MG tablet Take 1 tablet (10 mg total) by mouth daily. 90 tablet 3  . fluticasone (FLONASE) 50 MCG/ACT nasal spray Place 2 sprays into both nostrils daily. (Patient not taking: Reported on 03/30/2019) 16 g 6   No facility-administered medications prior to visit.    No Known Allergies  ROS Review of Systems See hpi   Objective:    Physical Exam  BP 112/68   Pulse 74   Temp 98 F (36.7 C) (Temporal)   Ht 5\' 4"  (1.626 m)   Wt 188 lb 6.4 oz (85.5 kg)   LMP 03/29/2019   SpO2 97%   BMI 32.34 kg/m  Wt Readings from Last 3 Encounters:  03/30/19 188 lb 6.4 oz (85.5 kg)  01/13/19 182 lb (82.6 kg)  09/22/18 182 lb (82.6 kg)    Physical Exam  Constitutional: Oriented to person, place, and time. Appears well-developed and  well-nourished.  HENT:  Head: Normocephalic and atraumatic.  Eyes: Conjunctivae and EOM are normal.  Pulmonary/Chest: Effort normal  Neurological: Is alert and oriented to person, place, and time.  Skin: Skin is warm. Capillary refill takes less than 2 seconds.  Psychiatric: Has a normal mood and affect. Behavior is normal. Judgment and thought content normal.   There are no preventive care reminders to display for this patient.  There are no preventive care reminders to display for this patient.  Lab Results  Component Value Date   TSH 1.130 09/17/2018   Lab Results  Component Value Date   WBC 2.7 (L) 09/17/2018   HGB 12.3 09/17/2018   HCT 37.7 09/17/2018   MCV 83 09/17/2018   PLT 374 09/17/2018   Lab Results  Component Value Date   NA 141 09/17/2018   K 4.3 09/17/2018   CO2 20 09/17/2018   GLUCOSE 82 09/17/2018   BUN 11 09/17/2018   CREATININE 0.66 09/17/2018   BILITOT 0.4 09/17/2018   ALKPHOS 53 09/17/2018   AST 16 09/17/2018   ALT 9 09/17/2018   PROT 7.4 09/17/2018   ALBUMIN 4.2 09/17/2018   CALCIUM 9.1 09/17/2018   ANIONGAP 7 07/02/2016   Lab Results  Component Value Date   CHOL 159 09/17/2018   Lab Results  Component Value Date   HDL 49 09/17/2018   Lab Results  Component Value Date   LDLCALC 101 (H) 09/17/2018   Lab Results  Component Value Date   TRIG 47 09/17/2018   Lab Results  Component Value Date   CHOLHDL 3.2 09/17/2018   Lab Results  Component Value Date   HGBA1C 5.1 09/17/2018      Assessment & Plan:   Problem List Items Addressed This Visit    None    Visit Diagnoses    Vaginal discharge    -  Primary     Advised pt to try boric acid suppository if she has vaginal discharge after her periods She should also take a probiotic Will not do a wet prep since patient is on her menstrual cycle  No orders of the defined types were placed in this encounter.   Follow-up: No follow-ups on file.    Forrest Moron, MD

## 2019-06-08 ENCOUNTER — Ambulatory Visit (INDEPENDENT_AMBULATORY_CARE_PROVIDER_SITE_OTHER): Payer: BC Managed Care – PPO | Admitting: Family Medicine

## 2019-06-08 ENCOUNTER — Encounter: Payer: Self-pay | Admitting: Family Medicine

## 2019-06-08 ENCOUNTER — Other Ambulatory Visit: Payer: Self-pay

## 2019-06-08 VITALS — BP 115/76 | HR 56 | Temp 98.0°F | Resp 17 | Ht 64.0 in | Wt 187.8 lb

## 2019-06-08 DIAGNOSIS — Z1329 Encounter for screening for other suspected endocrine disorder: Secondary | ICD-10-CM | POA: Diagnosis not present

## 2019-06-08 DIAGNOSIS — Z13220 Encounter for screening for lipoid disorders: Secondary | ICD-10-CM

## 2019-06-08 DIAGNOSIS — N62 Hypertrophy of breast: Secondary | ICD-10-CM

## 2019-06-08 DIAGNOSIS — Z0000 Encounter for general adult medical examination without abnormal findings: Secondary | ICD-10-CM

## 2019-06-08 DIAGNOSIS — Z130 Encounter for screening for diseases of the blood and blood-forming organs and certain disorders involving the immune mechanism: Secondary | ICD-10-CM

## 2019-06-08 DIAGNOSIS — Z23 Encounter for immunization: Secondary | ICD-10-CM | POA: Diagnosis not present

## 2019-06-08 DIAGNOSIS — Z13228 Encounter for screening for other metabolic disorders: Secondary | ICD-10-CM

## 2019-06-08 DIAGNOSIS — Z0001 Encounter for general adult medical examination with abnormal findings: Secondary | ICD-10-CM | POA: Diagnosis not present

## 2019-06-08 NOTE — Patient Instructions (Addendum)
  COVID-19 Vaccine Information can be found at: [email or web address] For questions related to vaccine distribution or appointments, please email [email or web address] or call [phone].     If you have lab work done today you will be contacted with your lab results within the next 2 weeks.  If you have not heard from us then please contact us. The fastest way to get your results is to register for My Chart.   IF you received an x-ray today, you will receive an invoice from [facility]. Please contact [facility] at [phone] with questions or concerns regarding your invoice.   IF you received labwork today, you will receive an invoice from [facility]. Please contact [facility] at [phone] with questions or concerns regarding your invoice.   Our billing staff will not be able to assist you with questions regarding bills from these companies.  You will be contacted with the lab results as soon as they are available. The fastest way to get your results is to activate your My Chart account. Instructions are located on the last page of this paperwork. If you have not heard from us regarding the results in 2 weeks, please contact this office.       

## 2019-06-08 NOTE — Progress Notes (Signed)
Chief Complaint  Patient presents with  . Annual Exam    no pap needed    Subjective:  Robin Mora is a 29 y.o. female here for a health maintenance visit.  Patient is established pt  Patient reports that she would like to get a breast reduction and a breast lift. She was denied by insurance.    Patient Active Problem List   Diagnosis Date Noted  . Sore throat 04/18/2018  . Acute pharyngitis 04/18/2018  . Ulcerative colitis ([place]) 06/30/2016  . Anemia 06/30/2016  . Hypokalemia 06/30/2016  . Ulcerative colitis, acute, with rectal bleeding ([place]) 06/30/2016  . Large breasts 05/07/2013  . Obesity, unspecified 05/07/2013    Past Medical History:  Diagnosis Date  . Endometriosis   . Ulcerative colitis ([place])     No past surgical history on file.   Outpatient Medications Prior to Visit  Medication Sig Dispense Refill  . cetirizine (ZYRTEC) 10 MG tablet Take 1 tablet (10 mg total) by mouth daily. 90 tablet 3  . fluticasone (FLONASE) 50 MCG/ACT nasal spray Place 2 sprays into both nostrils daily. 16 g 6   No facility-administered medications prior to visit.    No Known Allergies   Family History  Problem Relation Age of Onset  . Hyperlipidemia Father   . Hypertension Father   . Sleep apnea Father   . Diabetes Maternal Grandmother   . Diabetes Paternal Grandmother   . Hypertension Paternal Grandmother   . Heart disease Paternal Grandfather   . Hypertension Paternal Grandfather   . Kidney disease Paternal Grandfather   . Diabetes Paternal Grandfather      Health Habits: Dental Exam: up to date Eye Exam: up to date Exercise:  times/week on average Current exercise activities: walking/running Diet: balanced   Social History   Socioeconomic History  . Marital status: Single    Spouse name: n/a  . Number of children: 0  . Years of education: college  . Highest education level: Not on file  Occupational History  . Occupation: Teacher    Comment: Actor  Tobacco Use  . Smoking status: Never Smoker  . Smokeless tobacco: Never Used  Substance and Sexual Activity  . Alcohol use: Yes    Alcohol/week: 0.0 standard drinks    Comment: occ  . Drug use: No  . Sexual activity: Yes    Partners: Male    Birth control/protection: Condom    Comment: inconsistent condom use  Other Topics Concern  . Not on file  Social History Narrative   In graduate program at [employer] and anticipates a Master's Degree in [place] and [place] American Literature 06/2015. Her undergraduate degree in [employer] is from [employer].   Lives with her sister.   Social Determinants of Health   Financial Resource Strain:   . Difficulty of Paying Living Expenses:   Food Insecurity:   . Worried About Charity fundraiser in the Last Year:   . Arboriculturist in the Last Year:   Transportation Needs:   . Film/video editor (Medical):   Marland Kitchen Lack of Transportation (Non-Medical):   Physical Activity:   . Days of Exercise per Week:   . Minutes of Exercise per Session:   Stress:   . Feeling of Stress :   Social Connections:   . Frequency of Communication with Friends and Family:   . Frequency of Social Gatherings with Friends and Family:   . Attends Religious Services:   . Active Member  of Clubs or Organizations:   . Attends Banker Meetings:   Marland Kitchen Marital Status:   Intimate Partner Violence:   . Fear of Current or Ex-Partner:   . Emotionally Abused:   Marland Kitchen Physically Abused:   . Sexually Abused:    Social History   Substance and Sexual Activity  Alcohol Use Yes  . Alcohol/week: 0.0 standard drinks   Comment: occ   Social History   Tobacco Use  Smoking Status Never Smoker  Smokeless Tobacco Never Used   Social History   Substance and Sexual Activity  Drug Use No    GYN: Sexual Health Menstrual status: regular menses LMP: Patient's last menstrual period was 05/26/2019. Last pap smear: see HM section History of abnormal pap  smears:  Sexually active: with female partner Current contraception:   Health Maintenance: See under health Maintenance activity for review of completion dates as well. Immunization History  Administered Date(s) Administered  . PPD Test 03/15/2011, 02/17/2014  . Tdap 06/05/2009      Depression Screen-PHQ2/9 Depression screen [employer] 2/9 06/08/2019 03/30/2019 01/13/2019 09/22/2018 09/17/2018  Decreased Interest 0 0 0 0 0  Down, Depressed, Hopeless 0 0 0 0 0  PHQ - 2 Score 0 0 0 0 0       Depression Severity and Treatment Recommendations:  0-4= None  5-9= Mild / Treatment: Support, educate to call if worse; return in one month  10-14= Moderate / Treatment: Support, watchful waiting; Antidepressant or Psycotherapy  15-19= Moderately severe / Treatment: Antidepressant OR Psychotherapy  >= 20 = Major depression, severe / Antidepressant AND Psychotherapy    Review of Systems   ROS  See HPI for ROS as well.  Review of Systems  Constitutional: Negative for activity change, appetite change, chills and fever.  HENT: Negative for congestion, nosebleeds, trouble swallowing and voice change.   Respiratory: Negative for cough, shortness of breath and wheezing.   Gastrointestinal: Negative for diarrhea, nausea and vomiting.  Genitourinary: Negative for difficulty urinating, dysuria, flank pain and hematuria.  Musculoskeletal: Negative for back pain, joint swelling and neck pain.  Neurological: Negative for dizziness, speech difficulty, light-headedness and numbness.  See HPI. All other review of systems negative.    Objective:   Vitals:   06/08/19 1026  BP: 115/76  Pulse: (!) 56  Resp: 17  Temp: 98 F (36.7 C)  TempSrc: Temporal  SpO2: 96%  Weight: 187 lb 12.8 oz (85.2 kg)  Height: 5\' 4"  (1.626 m)   Wt Readings from Last 3 Encounters:  06/08/19 187 lb 12.8 oz (85.2 kg)  03/30/19 188 lb 6.4 oz (85.5 kg)  01/13/19 182 lb (82.6 kg)     Body mass index is 32.24  kg/m.  Physical Exam  BP 115/76 (BP Location: Right Arm, Patient Position: Sitting, Cuff Size: Normal)   Pulse (!) 56   Temp 98 F (36.7 C) (Temporal)   Resp 17   Ht 5\' 4"  (1.626 m)   Wt 187 lb 12.8 oz (85.2 kg)   LMP 05/26/2019   SpO2 96%   BMI 32.24 kg/m   General Appearance:    Alert, cooperative, no distress, appears stated age  Head:    Normocephalic, without obvious abnormality, atraumatic  Eyes:    conjunctiva/corneas clear, EOM's intact  Ears:    Normal TM's and external ear canals, both ears  Neck:   Supple, symmetrical, trachea midline, no adenopathy;    thyroid:  no enlargement/tenderness/nodules  Back:     Symmetric, no curvature, ROM  normal, no CVA tenderness  Lungs:     Clear to auscultation bilaterally, respirations unlabored  Chest Wall:    No tenderness or deformity   Heart:    Regular rate and rhythm, S1 and S2 normal, no murmur, rub   or gallop  Abdomen:     Soft, non-tender, bowel sounds active all four quadrants,    no masses, no organomegaly  Extremities:   Extremities normal, atraumatic, no cyanosis or edema  Pulses:   2+ and symmetric all extremities  Skin:   Skin color, texture, turgor normal, no rashes or lesions  Lymph nodes:   Cervical, supraclavicular, and axillary nodes normal  Neurologic:   CNII-XII intact, normal strength, sensation and reflexes    throughout       Assessment/Plan:   Patient was seen for a health maintenance exam.  Counseled the patient on health maintenance issues. Reviewed her health mainteance schedule and ordered appropriate tests (see orders.) Counseled on regular exercise and weight management. Recommend regular eye exams and dental cleaning.   The following issues were addressed today for health maintenance:   Nalina was seen today for annual exam.  Diagnoses and all orders for this visit:  Routine general medical examination at a health care facility - Women's Health Maintenance Plan Advised monthly breast  exam and annual mammogram Advised dental exam every six months Discussed stress management Discussed pap smear screening guidelines  Need for Tdap vaccination -     Tdap vaccine greater than or equal to 7yo IM   Screening for endocrine, metabolic and immunity disorder -     Comprehensive metabolic panel  Large breasts - discussed referral process  -     Ambulatory referral to Plastic Surgery    Return in about 1 year (around 06/07/2020) for physical .    Body mass index is 32.24 kg/m.:  Discussed the patient's BMI with patient. The BMI body mass index is 32.24 kg/m.     No future appointments.  Patient Instructions   COVID-19 Vaccine Information can be found at: [employer].nl For questions related to vaccine distribution or appointments, please email [email or web address] or call [phone].      If you have lab work done today you will be contacted with your lab results within the next 2 weeks.  If you have not heard from [place] then please contact us. The fastest way to get your results is to register for My Chart.   IF you received an x-ray today, you will receive an invoice from [facility]. Please contact [facility] at [phone] with questions or concerns regarding your invoice.   IF you received labwork today, you will receive an invoice from [facility]. Please contact [facility] at [phone] with questions or concerns regarding your invoice.   Our billing staff will not be able to assist you with questions regarding bills from these companies.  You will be contacted with the lab results as soon as they are available. The fastest way to get your results is to activate your My Chart account. Instructions are located on the last page of this paperwork. If you have not heard from [place] regarding the results in 2 weeks, please contact this office.

## 2019-06-09 LAB — COMPREHENSIVE METABOLIC PANEL
ALT: 12 IU/L (ref 0–32)
AST: 23 IU/L (ref 0–40)
Albumin/Globulin Ratio: 1.4 (ref 1.2–2.2)
Albumin: 4.2 g/dL (ref 3.9–5.0)
Alkaline Phosphatase: 58 IU/L (ref 39–117)
BUN/Creatinine Ratio: 20 (ref 9–23)
BUN: 14 mg/dL (ref 6–20)
Bilirubin Total: 0.4 mg/dL (ref 0.0–1.2)
CO2: 20 mmol/L (ref 20–29)
Calcium: 9.1 mg/dL (ref 8.7–10.2)
Chloride: 104 mmol/L (ref 96–106)
Creatinine, Ser: 0.7 mg/dL (ref 0.57–1.00)
GFR calc Af Amer: 136 mL/min/{1.73_m2} (ref >59)
GFR calc non Af Amer: 118 mL/min/{1.73_m2} (ref >59)
Globulin, Total: 3.1 g/dL (ref 1.5–4.5)
Glucose: 73 mg/dL (ref 65–99)
Potassium: 3.9 mmol/L (ref 3.5–5.2)
Sodium: 140 mmol/L (ref 134–144)
Total Protein: 7.3 g/dL (ref 6.0–8.5)

## 2019-07-15 ENCOUNTER — Institutional Professional Consult (permissible substitution): Payer: BC Managed Care – PPO | Admitting: Plastic Surgery

## 2019-08-26 ENCOUNTER — Encounter: Payer: Self-pay | Admitting: Plastic Surgery

## 2019-08-26 ENCOUNTER — Other Ambulatory Visit: Payer: Self-pay

## 2019-08-26 ENCOUNTER — Ambulatory Visit: Payer: BC Managed Care – PPO | Admitting: Plastic Surgery

## 2019-08-26 VITALS — BP 97/71 | HR 87 | Temp 98.0°F | Ht 64.5 in | Wt 187.0 lb

## 2019-08-26 DIAGNOSIS — L989 Disorder of the skin and subcutaneous tissue, unspecified: Secondary | ICD-10-CM

## 2019-08-26 DIAGNOSIS — M546 Pain in thoracic spine: Secondary | ICD-10-CM

## 2019-08-26 DIAGNOSIS — N62 Hypertrophy of breast: Secondary | ICD-10-CM | POA: Diagnosis not present

## 2019-08-26 DIAGNOSIS — M4004 Postural kyphosis, thoracic region: Secondary | ICD-10-CM | POA: Diagnosis not present

## 2019-08-26 DIAGNOSIS — M545 Low back pain, unspecified: Secondary | ICD-10-CM

## 2019-08-26 NOTE — Progress Notes (Signed)
Referring Provider Doristine Bosworth, MD [id number] [address] [place],  [place] [address]   CC:  Chief Complaint  Patient presents with  . Consult      Robin Mora is an 29 y.o. female.  HPI: Patient presents to discuss breast reduction.  She is had years of back pain, neck pain and shoulder grooving from her large breast.  She is currently a 38 double D and wants to be around a C.  She is never had a mammogram or any breast biopsies.  She does report some rashes beneath the crease that are intermittent and not controlled with over-the-counter medications.  She is tried cold packs, hot packs and supportive bras with no relief of her pain symptoms.  She has gone to physical therapy 1 time in the past but was unable to continue.  She has lost quite a bit of weight and went from 250 to her current 187 pounds.  She has been at 187 pounds for several years.  She does not smoke cigarettes but smokes 2-3 block a mild cigars per week.  She also wanted discuss a left forehead cyst.  Is been present for several years and is currently getting larger in size.  She would like to have it excised.  No Known Allergies  Outpatient Encounter Medications as of 08/26/2019  Medication Sig Note  . cetirizine (ZYRTEC) 10 MG tablet Take 1 tablet (10 mg total) by mouth daily. 06/08/2019: Need refill  . fluticasone (FLONASE) 50 MCG/ACT nasal spray Place 2 sprays into both nostrils daily. 06/08/2019: Need refill   No facility-administered encounter medications on file as of 08/26/2019.     Past Medical History:  Diagnosis Date  . Endometriosis   . Ulcerative colitis (HCC)     No past surgical history on file.  Family History  Problem Relation Age of Onset  . Hyperlipidemia Father   . Hypertension Father   . Sleep apnea Father   . Diabetes Maternal Grandmother   . Diabetes Paternal Grandmother   . Hypertension Paternal Grandmother   . Heart disease Paternal Grandfather   . Hypertension  Paternal Grandfather   . Kidney disease Paternal Grandfather   . Diabetes Paternal Grandfather     Social History   Social History Narrative   In graduate program at [employer] and anticipates a Master's Degree in [place] and African American Literature 06/2015. Her undergraduate degree in [employer] is from [employer].   Lives with her sister.     Review of Systems General: Denies fevers, chills, weight loss CV: Denies chest pain, shortness of breath, palpitations  Physical Exam Vitals with BMI 08/26/2019 06/08/2019 03/30/2019  Height 5' 4.5" 5\' 4"  5\' 4"   Weight 187 lbs 187 lbs 13 oz 188 lbs 6 oz  BMI 31.61 32.22 32.32  Systolic 97 115 112  Diastolic 71 76 68  Pulse 87 56 74    General:  No acute distress,  Alert and oriented, Non-Toxic, Normal speech and affect Breast: She has grade 3 ptosis.  Sternal notch to nipple is 34 cm on the right and 33 cm on the left.  Nipple to fold is 16 cm bilaterally.  I do not see any obvious scars or masses.  Examination of the left forehead shows a 2 cm diameter cystic subcutaneous freely mobile soft tissue mass.  I expect this is a cyst or lipoma.  Assessment/Plan The patient has bilateral symptomatic macromastia.  She is a good candidate for a  breast reduction.  She is interested in pursuing surgical treatment.  The details of breast reduction surgery were discussed.  I explained the procedure in detail along the with the expected scars.  The risks were discussed in detail and include bleeding, infection, damage to surrounding structures, need for additional procedures, nipple loss, change in nipple sensation, persistent pain, contour irregularities and asymmetries.  I explained that breast feeding is often not possible after breast reduction surgery.  We discussed the expected postoperative course with an overall recovery period of about 1 month.  She demonstrated full understanding of all risks.  We discussed her personal risk factors that include  smoking cigars.  I discussed the effect that nicotine has on wound healing.  Well to do 3 cigars/week is not extensive I encouraged her to stop otherwise I would increase her risk of complications.  I anticipate approximately 500 g of tissue removed from each side.  We also discussed excision of her left forehead cyst.  We discussed the risk include bleeding, infection, damage to surrounding structures, need for additional procedures.  We discussed the anticipated scar although I think it would look better than what she currently has.  I hope that we will be able to do this in the operating room at the same time as her breast reduction but of her breast reduction is postponed then we can do it in the office under local.   Robin Mora 08/26/2019, 4:34 PM

## 2020-02-08 ENCOUNTER — Other Ambulatory Visit: Payer: BC Managed Care – PPO

## 2020-04-21 ENCOUNTER — Encounter (HOSPITAL_COMMUNITY): Payer: Self-pay | Admitting: *Deleted

## 2020-04-21 ENCOUNTER — Emergency Department (HOSPITAL_COMMUNITY)
Admission: EM | Admit: 2020-04-21 | Discharge: 2020-04-21 | Disposition: A | Discharge status: Home / Self Care | Payer: BC Managed Care – PPO | Attending: Emergency Medicine | Admitting: Emergency Medicine

## 2020-04-21 ENCOUNTER — Emergency Department (HOSPITAL_COMMUNITY): Payer: BC Managed Care – PPO

## 2020-04-21 DIAGNOSIS — M79672 Pain in left foot: Secondary | ICD-10-CM | POA: Insufficient documentation

## 2020-04-21 MED ORDER — ACETAMINOPHEN 325 MG PO TABS
650.0000 mg | ORAL_TABLET | Freq: Once | ORAL | Status: AC
  Administered 2020-04-21: 650 mg via ORAL
  Filled 2020-04-21: qty 2

## 2020-04-21 NOTE — ED Triage Notes (Signed)
Pt complains of pain and swelling in bottom of left foot x 2 weeks. She denies injury.

## 2020-04-21 NOTE — Discharge Instructions (Addendum)
As discussed, your x-ray is negative for any broken bones or foreign bodies. You could possibly be developing a wart of your heel. You may try over the counter wart medications. Please follow-up with PCP if symptoms do not improve over the next week. You may take over the counter tylenol as needed for pain. Return to the ER for new or worsening symptoms.

## 2020-04-21 NOTE — ED Provider Notes (Signed)
[facility]-EMERGENCY DEPT Provider Note   CSN: [id number] Arrival date & time: 04/21/20  1419     History Chief Complaint  Patient presents with  . Foot Pain    Robin Mora is a 30 y.o. female with a past medical history significant for endometriosis and ulcerative colitis who presents to the ED due to intermittent left heel pain x2 weeks.  Patient denies direct trauma to left foot. Foot pain associated with mild edema. Denies fever and chills. She has not tried any OTC pain medication. Denies joint pain. Rates her pain a 7/10 worse with ambulation. No previous left foot injury.   History obtained from patient and past medical records. No interpreter used during encounter.      Past Medical History:  Diagnosis Date  . Endometriosis   . Ulcerative colitis [facility])     Patient Active Problem List   Diagnosis Date Noted  . Sore throat 04/18/2018  . Acute pharyngitis 04/18/2018  . Ulcerative colitis (HCC) 06/30/2016  . Anemia 06/30/2016  . Hypokalemia 06/30/2016  . Ulcerative colitis, acute, with rectal bleeding (HCC) 06/30/2016  . Large breasts 05/07/2013  . Obesity, unspecified 05/07/2013    History reviewed. No pertinent surgical history.   OB History   No obstetric history on file.     Family History  Problem Relation Age of Onset  . Hyperlipidemia Father   . Hypertension Father   . Sleep apnea Father   . Diabetes Maternal Grandmother   . Diabetes Paternal Grandmother   . Hypertension Paternal Grandmother   . Heart disease Paternal Grandfather   . Hypertension Paternal Grandfather   . Kidney disease Paternal Grandfather   . Diabetes Paternal Grandfather     Social History   Tobacco Use  . Smoking status: Never Smoker  . Smokeless tobacco: Never Used  Substance Use Topics  . Alcohol use: Yes    Alcohol/week: 0.0 standard drinks    Comment: occ  . Drug use: No    Home Medications Prior to Admission medications    Medication Sig Start Date End Date Taking? Authorizing Provider  cetirizine (ZYRTEC) 10 MG tablet Take 1 tablet (10 mg total) by mouth daily. 09/17/18   Janeece Agee, NP  fluticasone (FLONASE) 50 MCG/ACT nasal spray Place 2 sprays into both nostrils daily. 09/17/18   Janeece Agee, NP    Allergies    Patient has no known allergies.  Review of Systems   Review of Systems  Constitutional: Negative for chills and fever.  Musculoskeletal: Positive for arthralgias and gait problem.  Neurological: Negative for numbness.  All other systems reviewed and are negative.   Physical Exam Updated Vital Signs BP 128/85 (BP Location: Left Arm)   Pulse 69   Temp 98.1 F (36.7 C) (Oral)   Resp 16   LMP 03/23/2020   SpO2 100%   Physical Exam Vitals and nursing note reviewed.  Constitutional:      General: She is not in acute distress. HENT:     Head: Normocephalic.  Eyes:     Pupils: Pupils are equal, round, and reactive to light.  Cardiovascular:     Rate and Rhythm: Normal rate and regular rhythm.     Pulses: Normal pulses.     Heart sounds: Normal heart sounds. No murmur heard. No friction rub. No gallop.   Pulmonary:     Effort: Pulmonary effort is normal.     Breath sounds: Normal breath sounds.  Abdominal:     General:  Abdomen is flat. There is no distension.     Palpations: Abdomen is soft.     Tenderness: There is no abdominal tenderness. There is no guarding or rebound.  Musculoskeletal:     Cervical back: Neck supple.     Comments: Tenderness throughout left heel with central callus. No edema or erythema. Pedal pulses palpable. Sensation intact. Patient able to the ambulate in the ED. Full ROM of left wrist and all toes.    Skin:    General: Skin is warm and dry.  Neurological:     General: No focal deficit present.  Psychiatric:        Mood and Affect: Mood normal.        Behavior: Behavior normal.     ED Results / Procedures / Treatments   Labs (all labs  ordered are listed, but only abnormal results are displayed) Labs Reviewed - No data to display  EKG None  Radiology DG Foot Complete Left  Result Date: 04/21/2020 CLINICAL DATA:  Atraumatic left foot pain x2 weeks. EXAM: LEFT FOOT - COMPLETE 3+ VIEW COMPARISON:  None. FINDINGS: There is no evidence of fracture or dislocation. A chronic curvilinear soft tissue calcification is seen adjacent to the left cuboid bone. There is no evidence of arthropathy or other focal bone abnormality. IMPRESSION: No acute osseous abnormality. Electronically Signed   By: Aram Candela M.D.   On: 04/21/2020 16:10    Procedures Procedures   Medications Ordered in ED Medications  acetaminophen (TYLENOL) tablet 650 mg (650 mg Oral Given 04/21/20 1601)    ED Course  I have reviewed the triage vital signs and the nursing notes.  Pertinent labs & imaging results that were available during my care of the patient were reviewed by me and considered in my medical decision making (see chart for details).    MDM Rules/Calculators/A&P                         30 year old female presents to the ED due to left heel pain x2 weeks.  Denies fever and chills.  Denies direct injury.  Upon arrival, vitals all within normal limits.  Patient in no acute distress and ill-appearing.  Physical exam significant for tenderness throughout left heel with central callus possibly related to early plantar wart. Will obtain x-ray to rule out bony fracture or visible foreign bodies. Tylenol given for pain.   X-ray personally reviewed which is negative for any bony fractures.  No foreign bodies visible. Suspect symptoms related to possible early plantar wart. No signs of infection. Advised patient to try OTC wart treatment.  Instructed patient to follow-up with PCP if symptoms not improved within the next week. Strict ED precautions discussed with patient. Patient states understanding and agrees to plan. Patient discharged home in no acute  distress and stable vitals.  Final Clinical Impression(s) / ED Diagnoses Final diagnoses:  Left foot pain    Rx / DC Orders ED Discharge Orders    None       Jesusita Oka 04/21/20 1647    Terald Sleeper, MD 04/22/20 1421

## 2020-05-09 ENCOUNTER — Ambulatory Visit: Payer: BC Managed Care – PPO | Attending: Critical Care Medicine

## 2020-05-09 DIAGNOSIS — Z20822 Contact with and (suspected) exposure to covid-19: Secondary | ICD-10-CM

## 2020-05-11 LAB — NOVEL CORONAVIRUS, NAA

## 2020-05-26 ENCOUNTER — Telehealth: Payer: Self-pay | Admitting: Physician Assistant

## 2020-05-26 NOTE — Telephone Encounter (Signed)
Patient called in requesting an appointment for colonoscopy. Patient have had a colonoscopy 2018 at [facility]. I expressed to her that we would need her records reviewed before we can schedule her an appointment. She was upset and stated she should be able to make an appointment without records being reviewed. Conversation continued until patient disconnected the call.

## 2020-05-26 NOTE — Telephone Encounter (Signed)
Patient was last seen in November of 2015. She has seen another GI since seen by [place]. Do we schedule her without the records?  She was seen by Mike Gip and assigned to Dr Rhea Belton. Thanks

## 2020-05-27 NOTE — Telephone Encounter (Signed)
She needs to have her records reviewed prior to being seen

## 2020-07-31 ENCOUNTER — Ambulatory Visit
Admission: EM | Admit: 2020-07-31 | Discharge: 2020-07-31 | Disposition: A | Discharge status: Home / Self Care | Payer: BC Managed Care – PPO | Attending: Emergency Medicine | Admitting: Emergency Medicine

## 2020-07-31 ENCOUNTER — Other Ambulatory Visit: Payer: Self-pay

## 2020-07-31 DIAGNOSIS — Z113 Encounter for screening for infections with a predominantly sexual mode of transmission: Secondary | ICD-10-CM | POA: Insufficient documentation

## 2020-07-31 DIAGNOSIS — N898 Other specified noninflammatory disorders of vagina: Secondary | ICD-10-CM | POA: Diagnosis not present

## 2020-07-31 LAB — POCT URINALYSIS DIP (MANUAL ENTRY)
Bilirubin, UA: NEGATIVE
Blood, UA: NEGATIVE
Glucose, UA: NEGATIVE mg/dL
Ketones, POC UA: NEGATIVE mg/dL
Leukocytes, UA: NEGATIVE
Nitrite, UA: POSITIVE — AB
Protein Ur, POC: NEGATIVE mg/dL
Spec Grav, UA: 1.025 (ref 1.010–1.025)
Urobilinogen, UA: 0.2 E.U./dL
pH, UA: 7 (ref 5.0–8.0)

## 2020-07-31 LAB — POCT URINE PREGNANCY: Preg Test, Ur: NEGATIVE

## 2020-07-31 MED ORDER — METRONIDAZOLE 500 MG PO TABS: 500.0000 mg | ORAL_TABLET | Freq: Two times a day (BID) | ORAL | 0 refills | Status: AC

## 2020-07-31 NOTE — ED Provider Notes (Signed)
[facility]    CSN: [id number] Arrival date & time: 07/31/20  7017      History   Chief Complaint Chief Complaint  Patient presents with   Vaginal Discharge    HPI Robin Mora is a 30 y.o. female history of ulcerative colitis presenting today for evaluation of vaginal discharge.  Reports over the past 2 weeks she has had vaginal discharge which is dark in color with associated itching and odor.  Odor different from typical BV odor.  She has also had discomfort in her right lower abdomen.  Reports history of cysts.  Has had some urinary frequency and urgency.  Last menstrual cycle was 6/15.  Would like to be screened for STDs, but denies any new partners.  HPI  Past Medical History:  Diagnosis Date   Endometriosis    Ulcerative colitis [facility])     Patient Active Problem List   Diagnosis Date Noted   Sore throat 04/18/2018   Acute pharyngitis 04/18/2018   Ulcerative colitis (HCC) 06/30/2016   Anemia 06/30/2016   Hypokalemia 06/30/2016   Ulcerative colitis, acute, with rectal bleeding (HCC) 06/30/2016   Large breasts 05/07/2013   Obesity, unspecified 05/07/2013    History reviewed. No pertinent surgical history.  OB History   No obstetric history on file.      Home Medications    Prior to Admission medications   Medication Sig Start Date End Date Taking? Authorizing Provider  gabapentin (NEURONTIN) 300 MG capsule Take by mouth. 07/13/20  Yes [provider]  metroNIDAZOLE (FLAGYL) 500 MG tablet Take 1 tablet (500 mg total) by mouth 2 (two) times daily for 7 days. 07/31/20 08/07/20 Yes Chelcy Bolda C, PA-C  ferrous sulfate 325 (65 FE) MG tablet ferrous sulfate 325 mg (65 mg iron) tablet  TAKE 1 TABLET BY MOUTH EVERY DAY    [provider]  hydrocortisone 2.5 % cream Procto-Med HC 2.5 % topical cream perineal applicator  APPLY 1 APPLICATION TO AFFECTED AREA TWO TIMES DAILY    [provider]    Family  History Family History  Problem Relation Age of Onset   Hyperlipidemia Father    Hypertension Father    Sleep apnea Father    Diabetes Maternal Grandmother    Diabetes Paternal Grandmother    Hypertension Paternal Grandmother    Heart disease Paternal Grandfather    Hypertension Paternal Grandfather    Kidney disease Paternal Grandfather    Diabetes Paternal Grandfather     Social History Social History   Tobacco Use   Smoking status: Never   Smokeless tobacco: Never  Substance Use Topics   Alcohol use: Yes    Alcohol/week: 0.0 standard drinks    Comment: occ   Drug use: No     Allergies   Patient has no known allergies.   Review of Systems Review of Systems  Constitutional:  Negative for fever.  Respiratory:  Negative for shortness of breath.   Cardiovascular:  Negative for chest pain.  Gastrointestinal:  Negative for abdominal pain, diarrhea, nausea and vomiting.  Genitourinary:  Positive for frequency, urgency and vaginal discharge. Negative for dysuria, flank pain, genital sores, hematuria, menstrual problem, vaginal bleeding and vaginal pain.  Musculoskeletal:  Negative for back pain.  Skin:  Negative for rash.  Neurological:  Negative for dizziness, light-headedness and headaches.    Physical Exam Triage Vital Signs ED Triage Vitals [07/31/20 0934]  Enc Vitals Group     BP 124/76  Pulse Rate (!) 58     Resp 18     Temp 97.9 F (36.6 C)     Temp Source Oral     SpO2 98 %     Weight      Height      Head Circumference      Peak Flow      Pain Score      Pain Loc      Pain Edu?      Excl. in GC?    No data found.  Updated Vital Signs BP 124/76 (BP Location: Right Arm)   Pulse (!) 58   Temp 97.9 F (36.6 C) (Oral)   Resp 18   LMP 07/20/2020 (Exact Date)   SpO2 98%   Visual Acuity Right Eye Distance:   Left Eye Distance:   Bilateral Distance:    Right Eye Near:   Left Eye Near:    Bilateral Near:     Physical Exam Vitals and  nursing note reviewed.  Constitutional:      Appearance: She is well-developed.     Comments: No acute distress  HENT:     Head: Normocephalic and atraumatic.     Nose: Nose normal.  Eyes:     Conjunctiva/sclera: Conjunctivae normal.  Cardiovascular:     Rate and Rhythm: Normal rate.  Pulmonary:     Effort: Pulmonary effort is normal. No respiratory distress.  Abdominal:     General: There is no distension.  Musculoskeletal:        General: Normal range of motion.     Cervical back: Neck supple.  Skin:    General: Skin is warm and dry.  Neurological:     Mental Status: She is alert and oriented to person, place, and time.     UC Treatments / Results  Labs (all labs ordered are listed, but only abnormal results are displayed) Labs Reviewed  POCT URINALYSIS DIP (MANUAL ENTRY) - Abnormal; Notable for the following components:      Result Value   Nitrite, UA Positive (*)    All other components within normal limits  URINE CULTURE  RPR  HIV ANTIBODY (ROUTINE TESTING W REFLEX)  POCT URINE PREGNANCY  CERVICOVAGINAL ANCILLARY ONLY    EKG   Radiology No results found.  Procedures Procedures (including critical care time)  Medications Ordered in UC Medications - No data to display  Initial Impression / Assessment and Plan / UC Course  I have reviewed the triage vital signs and the nursing notes.  Pertinent labs & imaging results that were available during my care of the patient were reviewed by me and considered in my medical decision making (see chart for details).     Positive nitrite, will send for culture, [place] treating for BV today with Flagyl, screening pending for other STDs.  Discussed strict return precautions. Patient verbalized understanding and is agreeable with plan.  Final Clinical Impressions(s) / UC Diagnoses   Final diagnoses:  Screen for STD (sexually transmitted disease)  Vaginal discharge     Discharge Instructions      Begin  metronidazole/Flagyl-twice daily x1 week, no alcohol until 24 hours after last tablet  We are testing you for HIV, Syphillis, Gonorrhea, Chlamydia, Trichomonas, Yeast and Bacterial Vaginosis. We will call you if anything is positive and let you know if you require any further treatment. Please inform partners of any positive results.   Please return if symptoms not improving with treatment, development of fever, nausea, vomiting,  abdominal pain.      ED Prescriptions     Medication Sig Dispense Auth. Provider   metroNIDAZOLE (FLAGYL) 500 MG tablet Take 1 tablet (500 mg total) by mouth 2 (two) times daily for 7 days. 14 tablet Adisa Litt, [place] C, PA-C      PDMP not reviewed this encounter.   Lew Dawes, PA-C 07/31/20 1029

## 2020-07-31 NOTE — ED Triage Notes (Signed)
Greater than 2 week h/o vaginal discharge, itching and odor. C/o intermittent pain in right ovary, noting h/o cysts.  Denies dysuria and hematuria. Notes brown shade of discharge. Has started using natural feminine products. LMP 6/15. Pt would like to be tested for STD.

## 2020-07-31 NOTE — Discharge Instructions (Addendum)
Begin metronidazole/Flagyl-twice daily x1 week, no alcohol until 24 hours after last tablet  We are testing you for HIV, Syphillis, Gonorrhea, Chlamydia, Trichomonas, Yeast and Bacterial Vaginosis. We will call you if anything is positive and let you know if you require any further treatment. Please inform partners of any positive results.   Please return if symptoms not improving with treatment, development of fever, nausea, vomiting, abdominal pain.

## 2020-08-01 LAB — CERVICOVAGINAL ANCILLARY ONLY
Bacterial Vaginitis (gardnerella): POSITIVE — AB
Candida Glabrata: NEGATIVE
Candida Vaginitis: NEGATIVE
Chlamydia: NEGATIVE
Comment: NEGATIVE
Comment: NEGATIVE
Comment: NEGATIVE
Comment: NEGATIVE
Comment: NEGATIVE
Comment: NORMAL
Neisseria Gonorrhea: NEGATIVE
Trichomonas: NEGATIVE

## 2020-08-02 LAB — URINE CULTURE: Culture: >=100000 — AB

## 2020-08-03 LAB — RPR: RPR Ser Ql: NONREACTIVE

## 2020-08-03 LAB — HIV ANTIBODY (ROUTINE TESTING W REFLEX): HIV Screen 4th Generation wRfx: NONREACTIVE

## 2020-09-26 ENCOUNTER — Telehealth: Payer: BC Managed Care – PPO | Admitting: Physician Assistant

## 2020-09-26 DIAGNOSIS — R21 Rash and other nonspecific skin eruption: Secondary | ICD-10-CM

## 2020-09-26 MED ORDER — PREDNISONE 10 MG (21) PO TBPK: ORAL_TABLET | ORAL | 0 refills | Status: DC

## 2020-09-26 MED ORDER — TRIAMCINOLONE ACETONIDE 0.1 % EX CREA: 1.0000 "application " | TOPICAL_CREAM | Freq: Two times a day (BID) | CUTANEOUS | 0 refills | Status: DC

## 2020-09-26 NOTE — Progress Notes (Signed)
Virtual Visit Consent   Robin Mora, you are scheduled for a virtual visit with a [facility] provider today.     Just as with appointments in the office, your consent must be obtained to participate.  Your consent Mora be active for this visit and any virtual visit you may have with one of our providers in the next 365 days.     If you have a MyChart account, a copy of this consent can be sent to you electronically.  All virtual visits are billed to your insurance company just like a traditional visit in the office.    As this is a virtual visit, video technology does not allow for your provider to perform a traditional examination.  This may limit your provider's ability to fully assess your condition.  If your provider identifies any concerns that need to be evaluated in person or the need to arrange testing (such as labs, EKG, etc.), we Mora make arrangements to do so.     Although advances in technology are sophisticated, we cannot ensure that it Mora always work on either your end or our end.  If the connection with a video visit is poor, the visit may have to be switched to a telephone visit.  With either a video or telephone visit, we are not always able to ensure that we have a secure connection.     I need to obtain your verbal consent now.   Are you willing to proceed with your visit today?    [name] has provided verbal consent on 09/26/2020 for a virtual visit (video or telephone).   Margaretann Loveless, PA-C   Date: 09/26/2020 7:01 PM   Virtual Visit via Video Note   I, Margaretann Loveless, connected with  Robin Mora  ([id number], 30-Dec-1990) on 09/26/20 at  6:45 PM EDT by a video-enabled telemedicine application and verified that I am speaking with the correct person using two identifiers.  Location: Patient: Virtual Visit Location Patient: Home Provider: Virtual Visit Location Provider: Home Office   I discussed the limitations of  evaluation and management by telemedicine and the availability of in person appointments. The patient expressed understanding and agreed to proceed.    History of Present Illness: Robin Mora is a 30 y.o. who identifies as a female who was assigned female at birth, and is being seen today for rash.  HPI: Rash This is a new problem. The current episode started today. The problem has been gradually worsening since onset. The affected locations include the right arm. The rash is characterized by burning, itchiness, redness and swelling. 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 Pertinent negatives include no congestion, cough, facial edema, fever, joint pain or shortness of breath. Past treatments include antihistamine. The treatment provided no relief.     Problems:  Patient Active Problem List   Diagnosis Date Noted   Sore throat 04/18/2018   Acute pharyngitis 04/18/2018   Ulcerative colitis (HCC) 06/30/2016   Anemia 06/30/2016   Hypokalemia 06/30/2016   Ulcerative colitis, acute, with rectal bleeding (HCC) 06/30/2016   Large breasts 05/07/2013   Obesity, unspecified 05/07/2013    Allergies: No Known Allergies Medications:  Current Outpatient Medications:    predniSONE (STERAPRED UNI-PAK 21 TAB) 10 MG (21) TBPK tablet, 6 day taper; take as  directed on package instructions, Disp: 21 tablet, Rfl: 0   triamcinolone cream (KENALOG) 0.1 %, Apply 1 application topically 2 (two) times daily., Disp: 30 g, Rfl: 0   ferrous sulfate 325 (65 FE) MG tablet, ferrous sulfate 325 mg (65 mg iron) tablet  TAKE 1 TABLET BY MOUTH EVERY DAY, Disp: , Rfl:    gabapentin (NEURONTIN) 300 MG capsule, Take by mouth., Disp: , Rfl:    hydrocortisone 2.5 % cream, Procto-Med HC 2.5 % topical cream perineal applicator  APPLY 1 APPLICATION TO AFFECTED AREA TWO TIMES  DAILY, Disp: , Rfl:   Observations/Objective: Patient is well-developed, well-nourished in no acute distress.  Resting comfortably at home.  Head is normocephalic, atraumatic.  No labored breathing.  Speech is clear and coherent with logical content.  Patient is alert and oriented at baseline.  Area of redness noted on the right medial upper arm, has hive like appearance but difficult to fully visualize due to video   Assessment and Plan: 1. Rash and nonspecific skin eruption - predniSONE (STERAPRED UNI-PAK 21 TAB) 10 MG (21) TBPK tablet; 6 day taper; take as directed on package instructions  Dispense: 21 tablet; Refill: 0 - triamcinolone cream (KENALOG) 0.1 %; Apply 1 application topically 2 (two) times daily.  Dispense: 30 g; Refill: 0  - Possible contact dermatitis vs urticaria - Most likely from exposure to something while cleaning her classroom - Mora give oral prednisone taper to start tomorrow - Topical triamcinolone provided for tonight and for breakthrough symptoms in upcoming days - Advised OTC topical benadryl cream can be beneficial as well - If any signs/symptoms of infection arise please call back or seek in person evaluation   Follow Up Instructions: I discussed the assessment and treatment plan with the patient. The patient was provided an opportunity to ask questions and all were answered. The patient agreed with the plan and demonstrated an understanding of the instructions.  A copy of instructions were sent to the patient via MyChart.  The patient was advised to call back or seek an in-person evaluation if the symptoms worsen or if the condition fails to improve as anticipated.  Time:  I spent 12 minutes with the patient via telehealth technology discussing the above problems/concerns.    Margaretann Loveless, PA-C

## 2020-09-26 NOTE — Patient Instructions (Signed)
Estanislado Pandy Noberto Retort, thank you for joining Margaretann Loveless, PA-C for today's virtual visit.  While this provider is not your primary care provider (PCP), if your PCP is located in our provider database this encounter information will be shared with them immediately following your visit.  Consent: (Patient) Robin Mora provided verbal consent for this virtual visit at the beginning of the encounter.  Current Medications:  Current Outpatient Medications:    predniSONE (STERAPRED UNI-PAK 21 TAB) 10 MG (21) TBPK tablet, 6 day taper; take as directed on package instructions, Disp: 21 tablet, Rfl: 0   triamcinolone cream (KENALOG) 0.1 %, Apply 1 application topically 2 (two) times daily., Disp: 30 g, Rfl: 0   ferrous sulfate 325 (65 FE) MG tablet, ferrous sulfate 325 mg (65 mg iron) tablet  TAKE 1 TABLET BY MOUTH EVERY DAY, Disp: , Rfl:    gabapentin (NEURONTIN) 300 MG capsule, Take by mouth., Disp: , Rfl:    hydrocortisone 2.5 % cream, Procto-Med HC 2.5 % topical cream perineal applicator  APPLY 1 APPLICATION TO AFFECTED AREA TWO TIMES DAILY, Disp: , Rfl:    Medications ordered in this encounter:  Meds ordered this encounter  Medications   predniSONE (STERAPRED UNI-PAK 21 TAB) 10 MG (21) TBPK tablet    Sig: 6 day taper; take as directed on package instructions    Dispense:  21 tablet    Refill:  0    Order Specific Question:   Supervising Provider    Answer:   Hyacinth Meeker, BRIAN [[id number]]   triamcinolone cream (KENALOG) 0.1 %    Sig: Apply 1 application topically 2 (two) times daily.    Dispense:  30 g    Refill:  0    Order Specific Question:   Supervising Provider    Answer:   Hyacinth Meeker, BRIAN [[id number]]     *If you need refills on other medications prior to your next appointment, please contact your pharmacy*  Follow-Up: Call back or seek an in-person evaluation if the symptoms worsen or if the condition fails to improve as anticipated.  Other Instructions Contact  Dermatitis Dermatitis is redness, soreness, and swelling (inflammation) of the skin. Contact dermatitis is a reaction to something that touches theskin. There are two types of contact dermatitis: Irritant contact dermatitis. This happens when something bothers (irritates) your skin, like soap. Allergic contact dermatitis. This is caused when you are exposed to something that you are allergic to, such as poison ivy. What are the causes? Common causes of irritant contact dermatitis include: Makeup. Soaps. Detergents. Bleaches. Acids. Metals, such as nickel. Common causes of allergic contact dermatitis include: Plants. Chemicals. Jewelry. Latex. Medicines. Preservatives in products, such as clothing. What increases the risk? Having a job that exposes you to things that bother your skin. Having asthma or eczema. What are the signs or symptoms? Symptoms may happen anywhere the irritant has touched your skin. Symptoms include: Dry or flaky skin. Redness. Cracks. Itching. Pain or a burning feeling. Blisters. Blood or clear fluid draining from skin cracks. With allergic contact dermatitis, swelling may occur. This may happen in placessuch as the eyelids, mouth, or genitals. How is this treated? This condition is treated by checking for the cause of the reaction and protecting your skin. Treatment may also include: Steroid creams, ointments, or medicines. Antibiotic medicines or other ointments, if you have a skin infection. Lotion or medicines to help with itching. A bandage (dressing). Follow these instructions at home: Skin care Moisturize your skin as  needed. Put cool cloths on your skin. Put a baking soda paste on your skin. Stir water into baking soda until it looks like a paste. Do not scratch your skin. Avoid having things rub up against your skin. Avoid the use of soaps, perfumes, and dyes. Medicines Take or apply over-the-counter and prescription medicines only as  told by your doctor. If you were prescribed an antibiotic medicine, take or apply it as told by your doctor. Do not stop using it even if your condition starts to get better. Bathing Take a bath with: Epsom salts. Baking soda. Colloidal oatmeal. Bathe less often. Bathe in warm water. Avoid using hot water. Bandage care If you were given a bandage, change it as told by your health care provider. Wash your hands with soap and water before and after you change your bandage. If soap and water are not available, use hand sanitizer. General instructions Avoid the things that caused your reaction. If you do not know what caused it, keep a journal. Write down: What you eat. What skin products you use. What you drink. What you wear in the area that has symptoms. This includes jewelry. Check the affected areas every day for signs of infection. Check for: More redness, swelling, or pain. More fluid or blood. Warmth. Pus or a bad smell. Keep all follow-up visits as told by your doctor. This is important. Contact a doctor if: You do not get better with treatment. Your condition gets worse. You have signs of infection, such as: More swelling. Tenderness. More redness. Soreness. Warmth. You have a fever. You have new symptoms. Get help right away if: You have a very bad headache. You have neck pain. Your neck is stiff. You throw up (vomit). You feel very sleepy. You see red streaks coming from the area. Your bone or joint near the area hurts after the skin has healed. The area turns darker. You have trouble breathing. Summary Dermatitis is redness, soreness, and swelling of the skin. Symptoms may occur where the irritant has touched you. Treatment may include medicines and skin care. If you do not know what caused your reaction, keep a journal. Contact a doctor if your condition gets worse or you have signs of infection. This information is not intended to replace advice given to  you by your health care provider. Make sure you discuss any questions you have with your healthcare provider. Document Revised: 05/14/2018 Document Reviewed: 08/07/2017 Elsevier Patient Education  2022 [employer].    If you have been instructed to have an in-person evaluation today at a local Urgent Care facility, please use the link below. It will take you to a list of all of our available [facility], including address, phone number and hours of operation. Please do not delay care.  [facility]  If you or a family member do not have a primary care provider, use the link below to schedule a visit and establish care. When you choose a [facility] primary care physician or advanced practice provider, you gain a long-term partner in health. Find a Primary Care Provider  Learn more about [facility] in-office and virtual care options: [facility] - Get Care Now

## 2020-10-05 ENCOUNTER — Telehealth: Payer: BC Managed Care – PPO | Admitting: Family

## 2020-10-05 DIAGNOSIS — R399 Unspecified symptoms and signs involving the genitourinary system: Secondary | ICD-10-CM

## 2020-10-05 MED ORDER — CEPHALEXIN 500 MG PO CAPS: 500.0000 mg | ORAL_CAPSULE | Freq: Two times a day (BID) | ORAL | 0 refills | Status: DC

## 2020-10-05 NOTE — Progress Notes (Signed)
Virtual Visit Consent   Robin Mora, you are scheduled for a virtual visit with a [facility] provider today.     Just as with appointments in the office, your consent must be obtained to participate.  Your consent will be active for this visit and any virtual visit you may have with one of our providers in the next 365 days.     If you have a MyChart account, a copy of this consent can be sent to you electronically.  All virtual visits are billed to your insurance company just like a traditional visit in the office.    As this is a virtual visit, video technology does not allow for your provider to perform a traditional examination.  This may limit your provider's ability to fully assess your condition.  If your provider identifies any concerns that need to be evaluated in person or the need to arrange testing (such as labs, EKG, etc.), we will make arrangements to do so.     Although advances in technology are sophisticated, we cannot ensure that it will always work on either your end or our end.  If the connection with a video visit is poor, the visit may have to be switched to a telephone visit.  With either a video or telephone visit, we are not always able to ensure that we have a secure connection.     I need to obtain your verbal consent now.   Are you willing to proceed with your visit today?    Robin Mora has provided verbal consent on 10/05/2020 for a virtual visit (video or telephone).   Robin Rodney, FNP   Date: 10/05/2020 5:13 PM   Virtual Visit via Video Note   I, Robin Mora, connected with  Robin Mora  ([id number], [birth year]) on 10/05/20 at  5:30 PM EDT by a video-enabled telemedicine application and verified that I am speaking with the correct person using two identifiers.  Location: Patient: Virtual Visit Location Patient: Home Provider: Virtual Visit Location Provider: Home   I discussed the limitations of evaluation and  management by telemedicine and the availability of in person appointments. The patient expressed understanding and agreed to proceed.    History of Present Illness: Robin Mora is a 30 y.o. who identifies as a female who was assigned female at birth, and is being seen today for urinary frequency.  HPI: Urinary Frequency  This is a new problem. The current episode started in the past 7 days. The problem occurs every urination. The problem has been gradually worsening. The pain is at a severity of 2/10. The patient is experiencing no pain. Associated symptoms include frequency, hematuria, nausea and urgency. Pertinent negatives include no discharge, flank pain, hesitancy or vomiting. She has tried increased fluids for the symptoms. The treatment provided mild relief.   Problems:  Patient Active Problem List   Diagnosis Date Noted   Sore throat 04/18/2018   Acute pharyngitis 04/18/2018   Ulcerative colitis (HCC) 06/30/2016   Anemia 06/30/2016   Hypokalemia 06/30/2016   Ulcerative colitis, acute, with rectal bleeding (HCC) 06/30/2016   Large breasts 05/07/2013   Obesity, unspecified 05/07/2013    Allergies: No Known Allergies Medications:  Current Outpatient Medications:    cephALEXin (KEFLEX) 500 MG capsule, Take 1 capsule (500 mg total) by mouth 2 (two) times daily., Disp: 14 capsule, Rfl: 0   ferrous sulfate 325 (65 FE) MG tablet, ferrous sulfate 325 mg (65 mg iron)  tablet  TAKE 1 TABLET BY MOUTH EVERY DAY, Disp: , Rfl:    gabapentin (NEURONTIN) 300 MG capsule, Take by mouth., Disp: , Rfl:    hydrocortisone 2.5 % cream, Procto-Med HC 2.5 % topical cream perineal applicator  APPLY 1 APPLICATION TO AFFECTED AREA TWO TIMES DAILY, Disp: , Rfl:    triamcinolone cream (KENALOG) 0.1 %, Apply 1 application topically 2 (two) times daily., Disp: 30 g, Rfl: 0  Observations/Objective: Patient is well-developed, well-nourished in no acute distress.  Resting comfortably  at home.  Head  is normocephalic, atraumatic.  No labored breathing. Speech is clear and coherent with logical content.  Patient is alert and oriented at baseline.    Assessment and Plan: 1. UTI symptoms - cephALEXin (KEFLEX) 500 MG capsule; Take 1 capsule (500 mg total) by mouth 2 (two) times daily.  Dispense: 14 capsule; Refill: 0 Force fluids AZO over the counter X2 days RTO if symptoms worsen or do not improve    Follow Up Instructions: I discussed the assessment and treatment plan with the patient. The patient was provided an opportunity to ask questions and all were answered. The patient agreed with the plan and demonstrated an understanding of the instructions.  A copy of instructions were sent to the patient via MyChart.  The patient was advised to call back or seek an in-person evaluation if the symptoms worsen or if the condition fails to improve as anticipated.  Time:  I spent 5 minutes with the patient via telehealth technology discussing the above problems/concerns.    Robin Rodney, FNP

## 2020-10-25 ENCOUNTER — Emergency Department (HOSPITAL_COMMUNITY)
Admission: EM | Admit: 2020-10-25 | Discharge: 2020-10-26 | Disposition: A | Discharge status: Home / Self Care | Payer: BC Managed Care – PPO | Attending: Physician Assistant | Admitting: Physician Assistant

## 2020-10-25 ENCOUNTER — Encounter (HOSPITAL_COMMUNITY): Payer: Self-pay | Admitting: Emergency Medicine

## 2020-10-25 ENCOUNTER — Other Ambulatory Visit: Payer: Self-pay

## 2020-10-25 DIAGNOSIS — R110 Nausea: Secondary | ICD-10-CM | POA: Insufficient documentation

## 2020-10-25 DIAGNOSIS — R300 Dysuria: Secondary | ICD-10-CM | POA: Insufficient documentation

## 2020-10-25 DIAGNOSIS — R350 Frequency of micturition: Secondary | ICD-10-CM | POA: Insufficient documentation

## 2020-10-25 DIAGNOSIS — M549 Dorsalgia, unspecified: Secondary | ICD-10-CM | POA: Insufficient documentation

## 2020-10-25 DIAGNOSIS — R42 Dizziness and giddiness: Secondary | ICD-10-CM | POA: Insufficient documentation

## 2020-10-25 DIAGNOSIS — Z5321 Procedure and treatment not carried out due to patient leaving prior to being seen by health care provider: Secondary | ICD-10-CM | POA: Insufficient documentation

## 2020-10-25 DIAGNOSIS — R509 Fever, unspecified: Secondary | ICD-10-CM | POA: Insufficient documentation

## 2020-10-25 LAB — COMPREHENSIVE METABOLIC PANEL
ALT: 12 U/L (ref 0–44)
AST: 17 U/L (ref 15–41)
Albumin: 3.9 g/dL (ref 3.5–5.0)
Alkaline Phosphatase: 50 U/L (ref 38–126)
Anion gap: 7 (ref 5–15)
BUN: 8 mg/dL (ref 6–20)
CO2: 26 mmol/L (ref 22–32)
Calcium: 8.8 mg/dL — ABNORMAL LOW (ref 8.9–10.3)
Chloride: 103 mmol/L (ref 98–111)
Creatinine, Ser: 0.57 mg/dL (ref 0.44–1.00)
GFR, Estimated: >60 mL/min (ref >60)
Glucose, Bld: 85 mg/dL (ref 70–99)
Potassium: 3.4 mmol/L — ABNORMAL LOW (ref 3.5–5.1)
Sodium: 136 mmol/L (ref 135–145)
Total Bilirubin: 0.5 mg/dL (ref 0.3–1.2)
Total Protein: 7.9 g/dL (ref 6.5–8.1)

## 2020-10-25 LAB — CBC WITH DIFFERENTIAL/PLATELET
Abs Immature Granulocytes: 0.01 10*3/uL (ref 0.00–0.07)
Basophils Absolute: 0.1 10*3/uL (ref 0.0–0.1)
Basophils Relative: 2 %
Eosinophils Absolute: 0.1 10*3/uL (ref 0.0–0.5)
Eosinophils Relative: 2 %
HCT: 39.9 % (ref 36.0–46.0)
Hemoglobin: 13.2 g/dL (ref 12.0–15.0)
Immature Granulocytes: 0 %
Lymphocytes Relative: 10 %
Lymphs Abs: 0.4 10*3/uL — ABNORMAL LOW (ref 0.7–4.0)
MCH: 29.8 pg (ref 26.0–34.0)
MCHC: 33.1 g/dL (ref 30.0–36.0)
MCV: 90.1 fL (ref 80.0–100.0)
Monocytes Absolute: 0.7 10*3/uL (ref 0.1–1.0)
Monocytes Relative: 18 %
Neutro Abs: 2.5 10*3/uL (ref 1.7–7.7)
Neutrophils Relative %: 68 %
Platelets: 357 10*3/uL (ref 150–400)
RBC: 4.43 MIL/uL (ref 3.87–5.11)
RDW: 14.6 % (ref 11.5–15.5)
WBC: 3.7 10*3/uL — ABNORMAL LOW (ref 4.0–10.5)
nRBC: 0 % (ref 0.0–0.2)

## 2020-10-25 LAB — I-STAT BETA HCG BLOOD, ED (MC, WL, AP ONLY): I-stat hCG, quantitative: <5 m[IU]/mL

## 2020-10-25 LAB — LIPASE, BLOOD: Lipase: 30 U/L (ref 11–51)

## 2020-10-25 MED ORDER — IBUPROFEN 200 MG PO TABS
600.0000 mg | ORAL_TABLET | Freq: Once | ORAL | Status: AC
  Administered 2020-10-25: 600 mg via ORAL
  Filled 2020-10-25: qty 3

## 2020-10-25 NOTE — ED Provider Notes (Signed)
Emergency Medicine Provider Triage Evaluation Note  Robin Mora , a 30 y.o. female  was evaluated in triage.  Pt complains of fever, headache, back ache.  Nauseated, dizzy and light headed.  She is not on any immuno suppressing medications.   She took two pills of tylenol earlier today at 10-11.  She continues to have fever.   She was having urinary frequency and had an e- visit and she took keflex and that improved her symptoms but she feels like her symptoms have worsened today.   Review of Systems  Positive: Headache, back pain, sore thraot Negative: cough  Physical Exam  BP 127/79 (BP Location: Right Arm)   Pulse (!) 104   Temp (!) 101.7 F (38.7 C) (Oral) Comment: made nurse aware  Resp 16   SpO2 100%  Gen:   Awake, no distress   Resp:  Normal effort  MSK:   Moves extremities without difficulty  Other:  No CVA tenderness to percussion   Medical Decision Making  Medically screening exam initiated at 4:32 PM.  Appropriate orders placed.  Robin Mora was informed that the remainder of the evaluation will be completed by another provider, this initial triage assessment does not replace that evaluation, and the importance of remaining in the ED until their evaluation is complete.     Cristina Gong, PA-C 10/25/20 1637    Arby Barrette, MD 10/29/20 [phone]

## 2020-10-25 NOTE — ED Triage Notes (Addendum)
Per pt, states she woke up feeling bad-congestion, back pain, dysuria, headache-states she took 2 Tylenol at 10 am-states she finished her antibiotics 4 days ago for UTI

## 2020-11-08 ENCOUNTER — Telehealth: Payer: Self-pay | Admitting: Physician Assistant

## 2020-11-08 DIAGNOSIS — B001 Herpesviral vesicular dermatitis: Secondary | ICD-10-CM

## 2020-11-08 MED ORDER — VALACYCLOVIR HCL 1 G PO TABS: 2000.0000 mg | ORAL_TABLET | Freq: Two times a day (BID) | ORAL | 0 refills | Status: AC

## 2020-11-08 NOTE — Progress Notes (Signed)
Virtual Visit Consent   Frona Yost, you are scheduled for a virtual visit with a [facility] provider today.     Just as with appointments in the office, your consent must be obtained to participate.  Your consent will be active for this visit and any virtual visit you may have with one of our providers in the next 365 days.     If you have a MyChart account, a copy of this consent can be sent to you electronically.  All virtual visits are billed to your insurance company just like a traditional visit in the office.    As this is a virtual visit, video technology does not allow for your provider to perform a traditional examination.  This may limit your provider's ability to fully assess your condition.  If your provider identifies any concerns that need to be evaluated in person or the need to arrange testing (such as labs, EKG, etc.), we will make arrangements to do so.     Although advances in technology are sophisticated, we cannot ensure that it will always work on either your end or our end.  If the connection with a video visit is poor, the visit may have to be switched to a telephone visit.  With either a video or telephone visit, we are not always able to ensure that we have a secure connection.     I need to obtain your verbal consent now.   Are you willing to proceed with your visit today?    Latica Coby Antrobus has provided verbal consent on 11/08/2020 for a virtual visit (video or telephone).   Robin Mora, [place]   Date: 11/08/2020 6:53 PM   Virtual Visit via Video Note   I, Robin Mora, connected with  Jamyah Folk  ([id number], 23-Oct-1990) on 11/08/20 at  6:45 PM EDT by a video-enabled telemedicine application and verified that I am speaking with the correct person using two identifiers.  Location: Patient: Virtual Visit Location Patient: Home Provider: Virtual Visit Location Provider: Home Office   I discussed the limitations of  evaluation and management by telemedicine and the availability of in person appointments. The patient expressed understanding and agreed to proceed.    History of Present Illness: Chryl Madora Barletta is a 30 y.o. who identifies as a female who was assigned female at birth, and is being seen today for fever blister of right lower lip first noted this morning. Has a history of fever blister but has not had one in quite some time. Denies fevers, chills. Marland Kitchen  HPI: HPI  Problems:  Patient Active Problem List   Diagnosis Date Noted   Sore throat 04/18/2018   Acute pharyngitis 04/18/2018   Ulcerative colitis (HCC) 06/30/2016   Anemia 06/30/2016   Hypokalemia 06/30/2016   Ulcerative colitis, acute, with rectal bleeding (HCC) 06/30/2016   Large breasts 05/07/2013   Obesity, unspecified 05/07/2013    Allergies: No Known Allergies Medications:  Current Outpatient Medications:    valACYclovir (VALTREX) 1000 MG tablet, Take 2 tablets (2,000 mg total) by mouth 2 (two) times daily for 1 day., Disp: 4 tablet, Rfl: 0   ferrous sulfate 325 (65 FE) MG tablet, ferrous sulfate 325 mg (65 mg iron) tablet  TAKE 1 TABLET BY MOUTH EVERY DAY, Disp: , Rfl:    gabapentin (NEURONTIN) 300 MG capsule, Take by mouth., Disp: , Rfl:    hydrocortisone 2.5 % cream, Procto-Med HC 2.5 % topical cream perineal  applicator  APPLY 1 APPLICATION TO AFFECTED AREA TWO TIMES DAILY, Disp: , Rfl:    triamcinolone cream (KENALOG) 0.1 %, Apply 1 application topically 2 (two) times daily., Disp: 30 g, Rfl: 0  Observations/Objective: Patient is well-developed, well-nourished in no acute distress.  Resting comfortably at home.  Head is normocephalic, atraumatic.  No labored breathing. Speech is clear and coherent with logical content.  Patient is alert and oriented at baseline.  Blistering lesion noted of R lower lip, crossing vermilion border.  Assessment and Plan: 1. Fever blister - valACYclovir (VALTREX) 1000 MG tablet; Take  2 tablets (2,000 mg total) by mouth 2 (two) times daily for 1 day.  Dispense: 4 tablet; Refill: 0 Start valacyclovir 2000 units BID for 2 doses for acute fever blister. Supportive measures and OTC medications reviewed.   Follow Up Instructions: I discussed the assessment and treatment plan with the patient. The patient was provided an opportunity to ask questions and all were answered. The patient agreed with the plan and demonstrated an understanding of the instructions.  A copy of instructions were sent to the patient via MyChart unless otherwise noted below.   The patient was advised to call back or seek an in-person evaluation if the symptoms worsen or if the condition fails to improve as anticipated.  Time:  I spent 10 minutes with the patient via telehealth technology discussing the above problems/concerns.    Robin Climes, PA-C

## 2020-11-08 NOTE — Patient Instructions (Signed)
Wallis Mart, thank you for joining Piedad Climes, PA-C for today's virtual visit.  While this provider is not your primary care provider (PCP), if your PCP is located in our provider database this encounter information will be shared with them immediately following your visit.  Consent: (Patient) Robin Mora provided verbal consent for this virtual visit at the beginning of the encounter.  Current Medications:  Current Outpatient Medications:    valACYclovir (VALTREX) 1000 MG tablet, Take 2 tablets (2,000 mg total) by mouth 2 (two) times daily for 1 day., Disp: 4 tablet, Rfl: 0   ferrous sulfate 325 (65 FE) MG tablet, ferrous sulfate 325 mg (65 mg iron) tablet  TAKE 1 TABLET BY MOUTH EVERY DAY, Disp: , Rfl:    gabapentin (NEURONTIN) 300 MG capsule, Take by mouth., Disp: , Rfl:    hydrocortisone 2.5 % cream, Procto-Med HC 2.5 % topical cream perineal applicator  APPLY 1 APPLICATION TO AFFECTED AREA TWO TIMES DAILY, Disp: , Rfl:    triamcinolone cream (KENALOG) 0.1 %, Apply 1 application topically 2 (two) times daily., Disp: 30 g, Rfl: 0   Medications ordered in this encounter:  Meds ordered this encounter  Medications   valACYclovir (VALTREX) 1000 MG tablet    Sig: Take 2 tablets (2,000 mg total) by mouth 2 (two) times daily for 1 day.    Dispense:  4 tablet    Refill:  0    Order Specific Question:   Supervising Provider    Answer:   Hyacinth Meeker, BRIAN [[id number]]     *If you need refills on other medications prior to your next appointment, please contact your pharmacy*  Follow-Up: Call back or seek an in-person evaluation if the symptoms worsen or if the condition fails to improve as anticipated.  Other Instructions Please keep skin clean and dry. You can apply topical OTC Abreva or Vaseline to the area. Take the Valacyclovir (Valtrex) as directed.  Cold Sore A cold sore, also called a fever blister, is a small, fluid-filled sore that forms inside of the  mouth or on the lips, gums, nose, chin, or cheeks. Cold sores can spread to other parts of the body, such as the eyes or fingers. Cold sores can spread from person to person (are contagious) until the sores crust over completely. Most cold sores go away within 2 weeks. What are the causes? Cold sores are caused by a virus (herpes simplex virus type 1, HSV-1). The virus can spread from person to person through close contact, such as through: Kissing. Touching the affected area. Sharing personal items such as lip balm, razors, a drinking glass, or eating utensils. What increases the risk? You are more likely to develop this condition if you: Are tired, stressed, or sick. Are having your period (menstruating). Are pregnant. Take certain medicines. Are out in cold weather or get too much sun. What are the signs or symptoms? Symptoms of a cold sore outbreak go through different stages. These are the stages of a cold sore: Tingling, itching, or burning is felt 1-2 days before the outbreak. Fluid-filled blisters appear on the lips, inside the mouth, on the nose, or on the cheeks. The blisters start to ooze clear fluid. The blisters dry up, and a yellow crust appears in their place. The crust falls off. In some cases, other symptoms can develop during a cold sore outbreak. These can include: Fever. Sore throat. Headache. Muscle aches. Swollen neck glands. How is this treated? There is no  cure for cold sores or the virus that causes them. There is also no vaccine to prevent the virus. Most cold sores go away on their own without treatment within 2 weeks. Your doctor may prescribe medicines to: Help with pain. Keep the virus from growing. Help you heal faster. Medicines may be in the form of creams, gels, pills, or a shot. Follow these instructions at home: Medicines Take or apply over-the-counter and prescription medicines only as told by your doctor. Use a cotton-tip swab to apply creams  or gels to your sores. Ask your doctor if you can take lysine supplements. These may help with healing. Sore care  Do not touch the sores or pick the scabs. Wash your hands often. Do not touch your eyes without washing your hands first. Keep the sores clean and dry. If told, put ice on the sores: Put ice in a plastic bag. Place a towel between your skin and the bag. Leave the ice on for 20 minutes, 2-3 times a day. Eating and drinking Eat a soft, bland diet. Avoid eating hot, cold, or salty foods. These can hurt your mouth. Use a straw if it hurts to drink out of a glass. Eat foods that have a lot of lysine in them. These include meat, fish, and dairy products. Avoid sugary foods, chocolates, nuts, and grains. These foods have a high amount of a substance (arginine) that can cause the virus to grow. Lifestyle Do not kiss, have oral sex, or share personal items until your sores heal. Stress, poor sleep, and being out in the sun can trigger outbreaks. Make sure you: Do activities that help you relax, such as deep breathing exercises or meditation. Get enough sleep. Apply sunscreen on your lips before you go out in the sun. Contact a doctor if: You have symptoms for more than 2 weeks. You have pus coming from the sores. You have redness that is spreading. You have pain or irritation in your eye. You get sores on your genitals. Your sores do not heal within 2 weeks. You get cold sores often. Get help right away if: You have a fever and your symptoms suddenly get worse. You have a headache and confusion. You have tiredness (fatigue). You do not want to eat as much as normal (loss of appetite). You have a stiff neck or are sensitive to light. Summary A cold sore is a small, fluid-filled sore that forms inside of the mouth or on the lips, gums, nose, chin, or cheeks. Cold sores can spread from person to person (are contagious) until the sores crust over completely. Most cold sores go  away within 2 weeks. Wash your hands often. Do not touch your eyes without washing your hands first. Do not kiss, have oral sex, or share personal items until your sores heal. Contact a doctor if your sores do not heal within 2 weeks. This information is not intended to replace advice given to you by your health care provider. Make sure you discuss any questions you have with your health care provider. Document Revised: 05/14/2018 Document Reviewed: 06/24/2017 Elsevier Patient Education  2022 [employer].    If you have been instructed to have an in-person evaluation today at a local Urgent Care facility, please use the link below. It will take you to a list of all of our available [facility], including address, phone number and hours of operation. Please do not delay care.   Urgent Cares  If you or a  family member do not have a primary care provider, use the link below to schedule a visit and establish care. When you choose a [facility] primary care physician or advanced practice provider, you gain a long-term partner in health. Find a Primary Care Provider  Learn more about [facility]'s in-office and virtual care options: [facility]

## 2021-05-04 LAB — OB RESULTS CONSOLE RUBELLA ANTIBODY, IGM: Rubella: NON-IMMUNE/NOT IMMUNE

## 2021-05-04 LAB — OB RESULTS CONSOLE HIV ANTIBODY (ROUTINE TESTING): HIV: NONREACTIVE

## 2021-05-04 LAB — OB RESULTS CONSOLE HEPATITIS B SURFACE ANTIGEN: Hepatitis B Surface Ag: NEGATIVE

## 2021-05-07 IMAGING — CR DG FOOT COMPLETE 3+V*L*
3 series · 3 of 3 positions shown · non-contrast
Comparison: None.

CLINICAL DATA: Atraumatic left foot pain x2 weeks.

EXAM:
LEFT FOOT - COMPLETE 3+ VIEW

[x foot ap left]
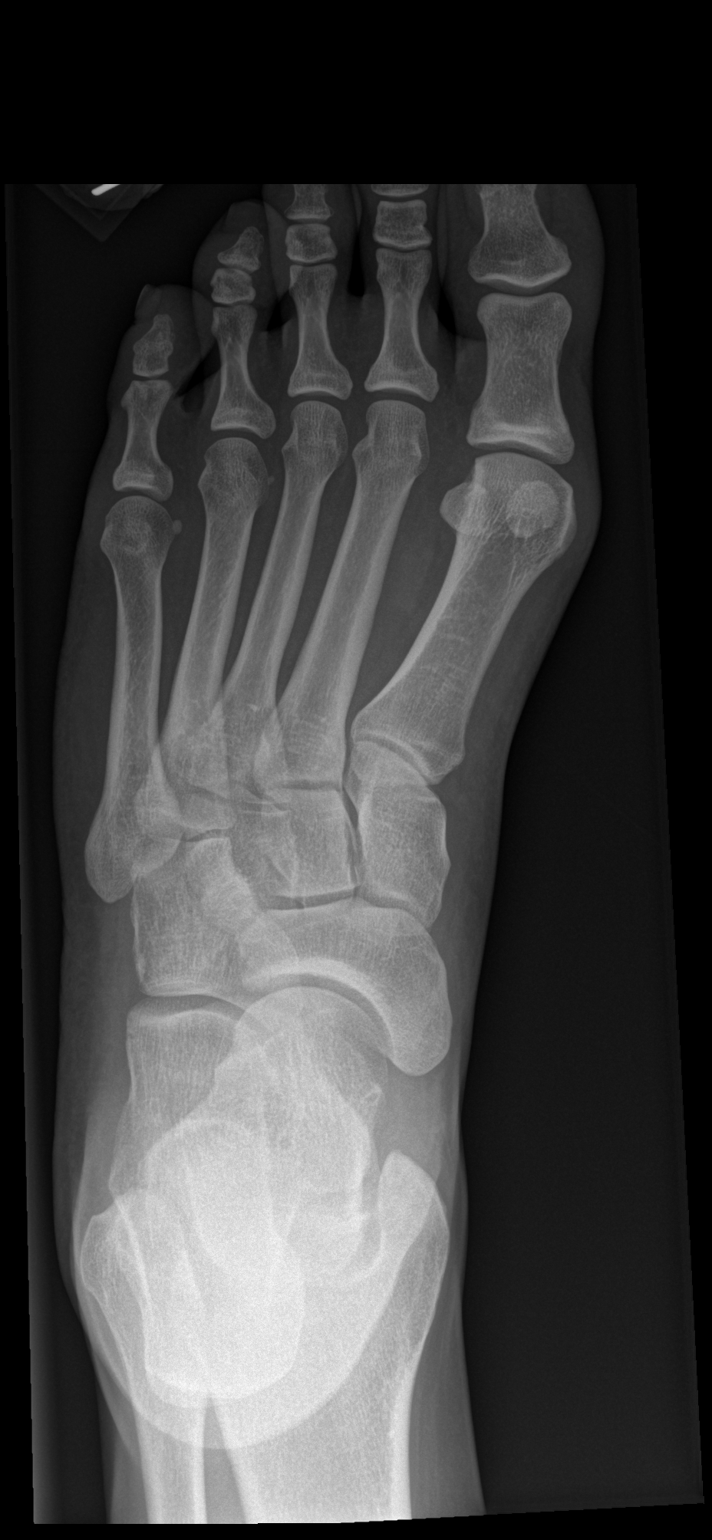

[x foot obl left]
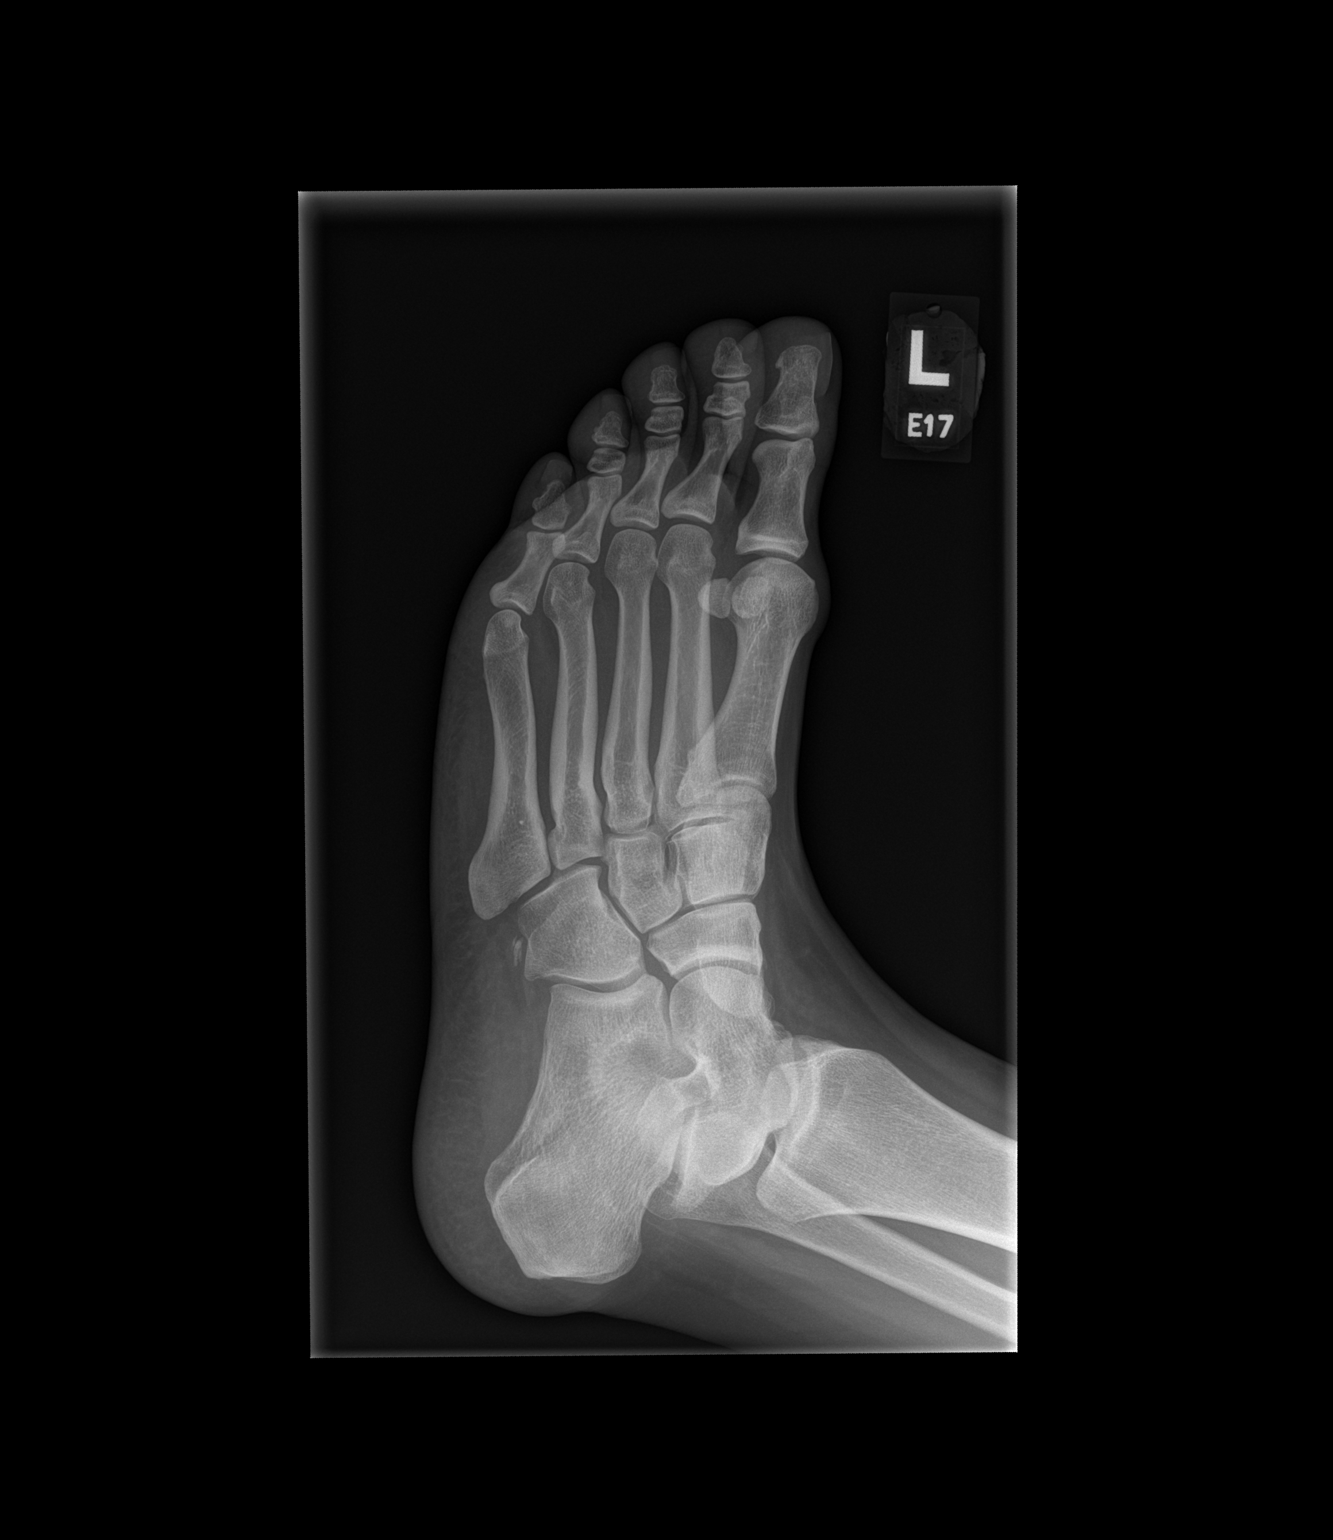

[x foot lat left]
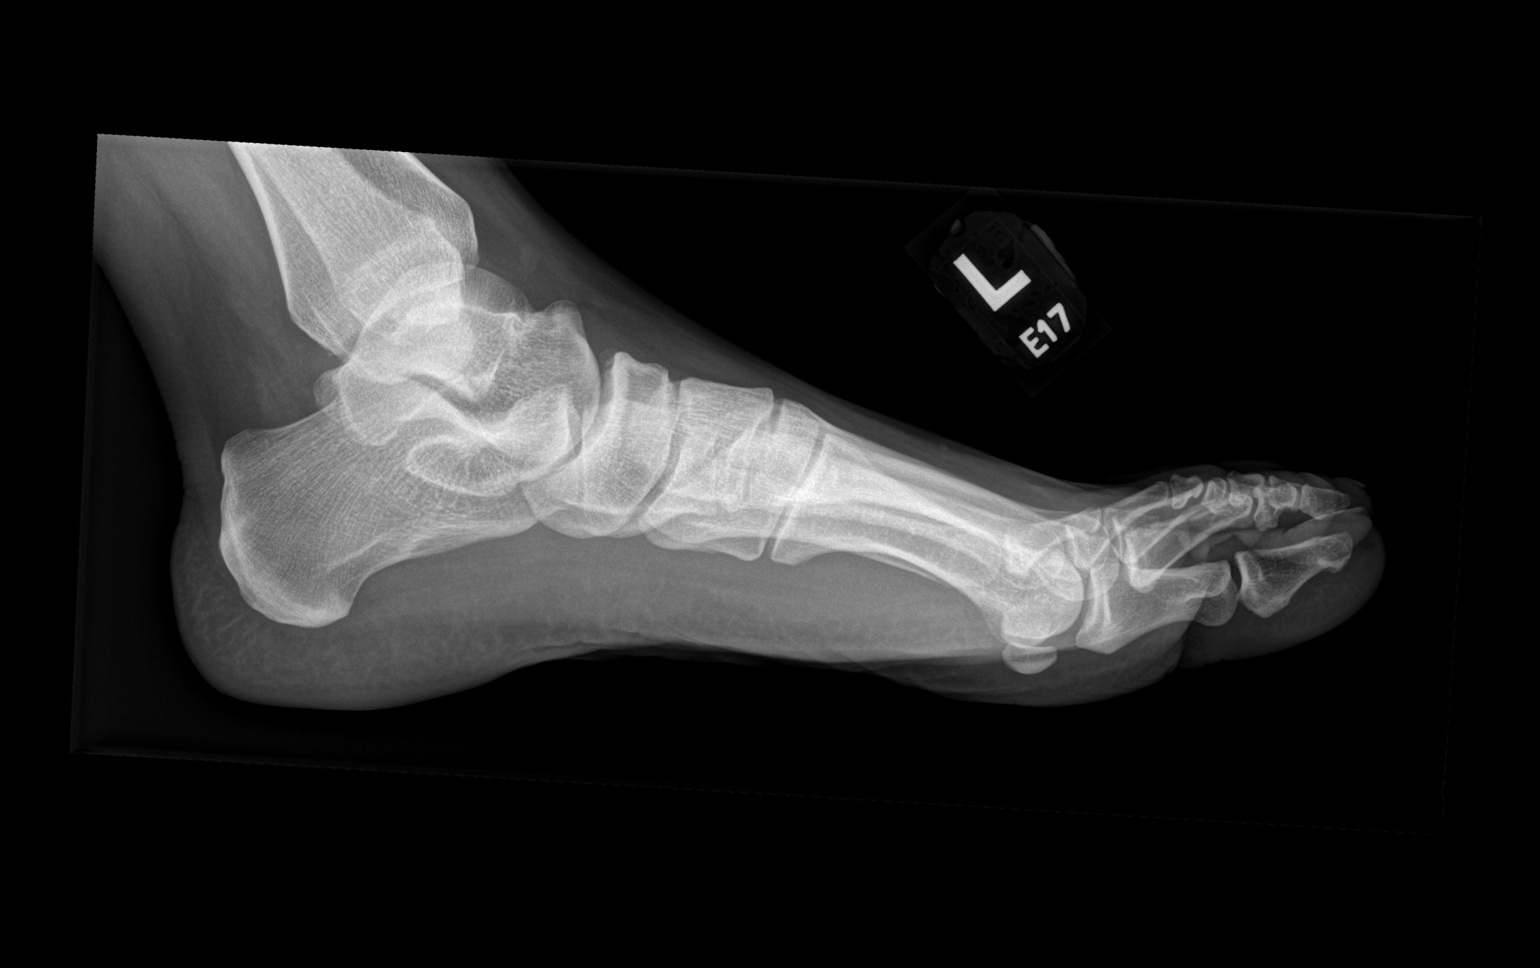

[3 of 3 positions shown; findings below may reference images not displayed]

FINDINGS: There is no evidence of fracture or dislocation. A chronic
curvilinear soft tissue calcification is seen adjacent to the left
cuboid bone. There is no evidence of arthropathy or other focal bone
abnormality.
IMPRESSION: No acute osseous abnormality.

## 2021-07-19 ENCOUNTER — Other Ambulatory Visit: Payer: Self-pay

## 2021-07-19 ENCOUNTER — Inpatient Hospital Stay (HOSPITAL_COMMUNITY)
Admission: AD | Admit: 2021-07-19 | Discharge: 2021-07-19 | Disposition: A | Discharge status: Home / Self Care | Payer: BC Managed Care – PPO | Attending: Obstetrics and Gynecology | Admitting: Obstetrics and Gynecology

## 2021-07-19 ENCOUNTER — Encounter (HOSPITAL_COMMUNITY): Payer: Self-pay | Admitting: Obstetrics and Gynecology

## 2021-07-19 DIAGNOSIS — O23592 Infection of other part of genital tract in pregnancy, second trimester: Secondary | ICD-10-CM | POA: Diagnosis not present

## 2021-07-19 DIAGNOSIS — O26892 Other specified pregnancy related conditions, second trimester: Secondary | ICD-10-CM | POA: Diagnosis not present

## 2021-07-19 DIAGNOSIS — B9689 Other specified bacterial agents as the cause of diseases classified elsewhere: Secondary | ICD-10-CM

## 2021-07-19 DIAGNOSIS — N760 Acute vaginitis: Secondary | ICD-10-CM

## 2021-07-19 DIAGNOSIS — Z3A23 23 weeks gestation of pregnancy: Secondary | ICD-10-CM | POA: Insufficient documentation

## 2021-07-19 DIAGNOSIS — R102 Pelvic and perineal pain: Secondary | ICD-10-CM | POA: Insufficient documentation

## 2021-07-19 DIAGNOSIS — O26899 Other specified pregnancy related conditions, unspecified trimester: Secondary | ICD-10-CM

## 2021-07-19 HISTORY — DX: Gastro-esophageal reflux disease without esophagitis: K21.9

## 2021-07-19 LAB — URINALYSIS, ROUTINE W REFLEX MICROSCOPIC
Bilirubin Urine: NEGATIVE
Glucose, UA: NEGATIVE mg/dL
Hgb urine dipstick: NEGATIVE
Ketones, ur: NEGATIVE mg/dL
Leukocytes,Ua: NEGATIVE
Nitrite: NEGATIVE
Protein, ur: NEGATIVE mg/dL
Specific Gravity, Urine: 1.018 (ref 1.005–1.030)
pH: 5 (ref 5.0–8.0)

## 2021-07-19 LAB — WET PREP, GENITAL
Sperm: NONE SEEN
Trich, Wet Prep: NONE SEEN
WBC, Wet Prep HPF POC: <10 (ref <10)
Yeast Wet Prep HPF POC: NONE SEEN

## 2021-07-19 MED ORDER — METRONIDAZOLE 500 MG PO TABS: 500.0000 mg | ORAL_TABLET | Freq: Two times a day (BID) | ORAL | 0 refills | Status: DC

## 2021-07-19 MED ORDER — CYCLOBENZAPRINE HCL 5 MG PO TABS
10.0000 mg | ORAL_TABLET | Freq: Once | ORAL | Status: AC
  Administered 2021-07-19: 10 mg via ORAL
  Filled 2021-07-19: qty 2

## 2021-07-19 MED ORDER — ALUM & MAG HYDROXIDE-SIMETH 200-200-20 MG/5ML PO SUSP
30.0000 mL | Freq: Once | ORAL | Status: AC
  Administered 2021-07-19: 30 mL via ORAL
  Filled 2021-07-19: qty 30

## 2021-07-19 MED ORDER — LIDOCAINE VISCOUS HCL 2 % MT SOLN
15.0000 mL | Freq: Once | OROMUCOSAL | Status: AC
  Administered 2021-07-19: 15 mL via ORAL
  Filled 2021-07-19: qty 15

## 2021-07-19 NOTE — MAU Note (Signed)
.  Robin Mora is a 31 y.o. at [redacted]w[redacted]d here in MAU reporting: a constant dull LUQ pain "under her boob" that radiating down to her lower ABD that is more sharp and cramping. Pt reports its been occurring for the last two weeks and she has noticed her OB. Pt reports it started more intermittent and is now constant, it is worst when she is sitting up or laying flat. Pt states she is having some intermittent white watery discharge for the last month. Pt denies DFM, VB, PIH s/s, and recent intercourse in last 48 hours. EDC: 11/12/2021 Onset of complaint: two weeks Pain score: 8/10 Vitals:   07/19/21 2009  BP: 117/65  Pulse: 82  Resp: 18  Temp: 98.5 F (36.9 C)  SpO2: 99%     FHT:149 Lab orders placed from triage:  UA

## 2021-07-19 NOTE — MAU Provider Note (Signed)
History     CSN: [id number]  Arrival date and time: 07/19/21 1932   Event Date/Time   First Provider Initiated Contact with Patient 07/19/21 2112      Chief Complaint  Patient presents with   Abdominal Pain   Vaginal Discharge   HPI  Robin Mora is a 31 y.o. G1P0 at 3642w3d who presents for evaluation of abdominal pain. Patient reports left sided abdominal pain under her left breast and in the left side of her pelvis. She reports this has been ongoing for several days. Patient rates the pain as a 6/10 and has tried tylenol for the pain with relief. She reports the pelvic pain is worse with movement. She also reports an increase in creamy white discharge and is worried about infection. She denies any vaginal bleeding and leaking of fluid. Denies any constipation, diarrhea or any urinary complaints. Reports normal fetal movement.   OB History     Gravida  1   Para      Term      Preterm      AB      Living         SAB      IAB      Ectopic      Multiple      Live Births              Past Medical History:  Diagnosis Date   Endometriosis    GERD (gastroesophageal reflux disease)    Ulcerative colitis (HCC)     Past Surgical History:  Procedure Laterality Date   NO PAST SURGERIES      Family History  Problem Relation Age of Onset   Hyperlipidemia Father    Hypertension Father    Sleep apnea Father    Diabetes Maternal Grandmother    Diabetes Paternal Grandmother    Hypertension Paternal Grandmother    Heart disease Paternal Grandfather    Hypertension Paternal Grandfather    Kidney disease Paternal Grandfather    Diabetes Paternal Grandfather     Social History   Tobacco Use   Smoking status: Never   Smokeless tobacco: Never  Vaping Use   Vaping Use: Never used  Substance Use Topics   Alcohol use: Not Currently    Comment: occ   Drug use: Yes    Frequency: 3.0 times per week    Types: Marijuana    Comment: "every other  day"    Allergies: No Known Allergies  Medications Prior to Admission  Medication Sig Dispense Refill Last Dose   ferrous sulfate 325 (65 FE) MG tablet ferrous sulfate 325 mg (65 mg iron) tablet  TAKE 1 TABLET BY MOUTH EVERY DAY      gabapentin (NEURONTIN) 300 MG capsule Take by mouth. (Patient not taking: Reported on 07/19/2021)   Not Taking   hydrocortisone 2.5 % cream Procto-Med HC 2.5 % topical cream perineal applicator  APPLY 1 APPLICATION TO AFFECTED AREA TWO TIMES DAILY      triamcinolone cream (KENALOG) 0.1 % Apply 1 application topically 2 (two) times daily. 30 g 0     Review of Systems  Constitutional: Negative.  Negative for fatigue and fever.  HENT: Negative.    Respiratory: Negative.  Negative for shortness of breath.   Cardiovascular: Negative.  Negative for chest pain.  Gastrointestinal:  Positive for abdominal pain. Negative for constipation, diarrhea, nausea and vomiting.  Genitourinary:  Positive for vaginal discharge. Negative for dysuria and vaginal bleeding.  Neurological: Negative.  Negative for dizziness and headaches.   Physical Exam   Blood pressure (!) 114/59, pulse 77, temperature 98.5 F (36.9 C), temperature source Oral, resp. rate 18, height 5' 4.5" (1.638 m), weight 95.6 kg, last menstrual period 07/20/2020, SpO2 99 %.  Patient Vitals for the past 24 hrs:  BP Temp Temp src Pulse Resp SpO2 Height Weight  07/19/21 2100 (!) 114/59 -- -- 77 -- 99 % -- --  07/19/21 2045 108/62 -- -- 85 -- 100 % -- --  07/19/21 2035 (!) 133/110 -- -- (!) 101 -- 100 % -- --  07/19/21 2009 117/65 98.5 F (36.9 C) Oral 82 18 99 % 5' 4.5" (1.638 m) 95.6 kg    Physical Exam Vitals and nursing note reviewed.  Constitutional:      General: She is not in acute distress.    Appearance: She is well-developed.  HENT:     Head: Normocephalic.  Eyes:     Pupils: Pupils are equal, round, and reactive to light.  Cardiovascular:     Rate and Rhythm: Normal rate and regular  rhythm.     Heart sounds: Normal heart sounds.  Pulmonary:     Effort: Pulmonary effort is normal. No respiratory distress.     Breath sounds: Normal breath sounds.  Abdominal:     General: Bowel sounds are normal. There is no distension.     Palpations: Abdomen is soft.     Tenderness: There is no abdominal tenderness.  Skin:    General: Skin is warm and dry.  Neurological:     Mental Status: She is alert and oriented to person, place, and time.  Psychiatric:        Mood and Affect: Mood normal.        Behavior: Behavior normal.        Thought Content: Thought content normal.        Judgment: Judgment normal.    Fetal Tracing:  Baseline: 150 Variability: moderate Accels: none Decels: none  Toco: none  Cervix: closed/thick/posterior  MAU Course  Procedures  Results for orders placed or performed during the hospital encounter of 07/19/21 (from the past 24 hour(s))  Urinalysis, Routine w reflex microscopic     Status: Abnormal   Collection Time: 07/19/21  7:55 PM  Result Value Ref Range   Color, Urine YELLOW YELLOW   APPearance HAZY (A) CLEAR   Specific Gravity, Urine 1.018 1.005 - 1.030   pH 5.0 5.0 - 8.0   Glucose, UA NEGATIVE NEGATIVE mg/dL   Hgb urine dipstick NEGATIVE NEGATIVE   Bilirubin Urine NEGATIVE NEGATIVE   Ketones, ur NEGATIVE NEGATIVE mg/dL   Protein, ur NEGATIVE NEGATIVE mg/dL   Nitrite NEGATIVE NEGATIVE   Leukocytes,Ua NEGATIVE NEGATIVE    MDM Prenatal records from community office not on file. Patient denies any complications this pregnancy Labs ordered and reviewed.   UA Flexeril PO GI cocktail  Single isolated elevated BP. Patient denies any HA, visual changes or epigastric pain. No hx of HTN. Repeats all normal  Patient reports some improvement of pain. CNM encouraged patient to use pregnancy support belt and change position slowly. Discussed possible GI origin for LUQ pain. Patient states she already has follow up scheduled with GI within  the week.   Assessment and Plan   1. Bacterial vaginosis   2. Pain of round ligament affecting pregnancy, antepartum   3. [redacted] weeks gestation of pregnancy     -Discharge home in stable condition -Rx for  metronidazole sent to patient's pharmacy -Second trimester precautions discussed -Patient advised to follow-up with OB as scheduled for prenatal care -Patient may return to MAU as needed or if her condition were to change or worsen  Rolm Bookbinder, CNM 07/19/2021, 9:12 PM

## 2021-07-20 LAB — GC/CHLAMYDIA PROBE AMP (~~LOC~~) NOT AT ARMC
Chlamydia: NEGATIVE
Comment: NEGATIVE
Comment: NORMAL
Neisseria Gonorrhea: NEGATIVE

## 2021-09-04 ENCOUNTER — Other Ambulatory Visit: Payer: Self-pay

## 2021-09-04 ENCOUNTER — Encounter (HOSPITAL_COMMUNITY): Payer: Self-pay | Admitting: Obstetrics and Gynecology

## 2021-09-04 ENCOUNTER — Inpatient Hospital Stay (HOSPITAL_COMMUNITY)
Admission: AD | Admit: 2021-09-04 | Discharge: 2021-09-04 | Disposition: A | Discharge status: Home / Self Care | Payer: BC Managed Care – PPO | Attending: Obstetrics and Gynecology | Admitting: Obstetrics and Gynecology

## 2021-09-04 DIAGNOSIS — R109 Unspecified abdominal pain: Secondary | ICD-10-CM | POA: Diagnosis present

## 2021-09-04 DIAGNOSIS — K51011 Ulcerative (chronic) pancolitis with rectal bleeding: Secondary | ICD-10-CM | POA: Insufficient documentation

## 2021-09-04 DIAGNOSIS — Z3A30 30 weeks gestation of pregnancy: Secondary | ICD-10-CM | POA: Diagnosis not present

## 2021-09-04 DIAGNOSIS — O99613 Diseases of the digestive system complicating pregnancy, third trimester: Secondary | ICD-10-CM | POA: Diagnosis not present

## 2021-09-04 HISTORY — DX: Anxiety disorder, unspecified: F41.9

## 2021-09-04 HISTORY — DX: Urinary tract infection, site not specified: N39.0

## 2021-09-04 HISTORY — DX: Anemia, unspecified: D64.9

## 2021-09-04 HISTORY — DX: Unspecified ovarian cyst, unspecified side: N83.209

## 2021-09-04 HISTORY — DX: Depression, unspecified: F32.A

## 2021-09-04 LAB — URINALYSIS, ROUTINE W REFLEX MICROSCOPIC
Bilirubin Urine: NEGATIVE
Glucose, UA: NEGATIVE mg/dL
Hgb urine dipstick: NEGATIVE
Ketones, ur: NEGATIVE mg/dL
Leukocytes,Ua: NEGATIVE
Nitrite: NEGATIVE
Protein, ur: NEGATIVE mg/dL
Specific Gravity, Urine: 1.021 (ref 1.005–1.030)
pH: 6 (ref 5.0–8.0)

## 2021-09-04 LAB — COMPREHENSIVE METABOLIC PANEL
ALT: 13 U/L (ref 0–44)
AST: 18 U/L (ref 15–41)
Albumin: 2.5 g/dL — ABNORMAL LOW (ref 3.5–5.0)
Alkaline Phosphatase: 82 U/L (ref 38–126)
Anion gap: 6 (ref 5–15)
BUN: 9 mg/dL (ref 6–20)
CO2: 21 mmol/L — ABNORMAL LOW (ref 22–32)
Calcium: 8.5 mg/dL — ABNORMAL LOW (ref 8.9–10.3)
Chloride: 110 mmol/L (ref 98–111)
Creatinine, Ser: 0.64 mg/dL (ref 0.44–1.00)
GFR, Estimated: >60 mL/min (ref >60)
Glucose, Bld: 78 mg/dL (ref 70–99)
Potassium: 3.5 mmol/L (ref 3.5–5.1)
Sodium: 137 mmol/L (ref 135–145)
Total Bilirubin: 0.3 mg/dL (ref 0.3–1.2)
Total Protein: 6.5 g/dL (ref 6.5–8.1)

## 2021-09-04 LAB — CBC
HCT: 32.3 % — ABNORMAL LOW (ref 36.0–46.0)
Hemoglobin: 10.5 g/dL — ABNORMAL LOW (ref 12.0–15.0)
MCH: 27.5 pg (ref 26.0–34.0)
MCHC: 32.5 g/dL (ref 30.0–36.0)
MCV: 84.6 fL (ref 80.0–100.0)
Platelets: 337 10*3/uL (ref 150–400)
RBC: 3.82 MIL/uL — ABNORMAL LOW (ref 3.87–5.11)
RDW: 13.5 % (ref 11.5–15.5)
WBC: 6.2 10*3/uL (ref 4.0–10.5)
nRBC: 0 % (ref 0.0–0.2)

## 2021-09-04 LAB — LIPASE, BLOOD: Lipase: 39 U/L (ref 11–51)

## 2021-09-04 MED ORDER — DICYCLOMINE HCL 10 MG PO CAPS: 10.0000 mg | ORAL_CAPSULE | Freq: Three times a day (TID) | ORAL | 1 refills | Status: DC

## 2021-09-04 MED ORDER — DICYCLOMINE HCL 10 MG PO CAPS: 10.0000 mg | ORAL_CAPSULE | Freq: Three times a day (TID) | ORAL | Status: DC

## 2021-09-04 MED ORDER — DICYCLOMINE HCL 10 MG PO CAPS
10.0000 mg | ORAL_CAPSULE | Freq: Once | ORAL | Status: AC
  Administered 2021-09-04: 10 mg via ORAL
  Filled 2021-09-04: qty 1

## 2021-09-04 MED ORDER — PREDNISONE 20 MG PO TABS: 40.0000 mg | ORAL_TABLET | Freq: Every day | ORAL | 0 refills | Status: AC
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
## 2021-09-04 NOTE — MAU Provider Note (Signed)
History     CSN: [id number]  Arrival date and time: 09/04/21 1002   Event Date/Time   First Provider Initiated Contact with Patient 09/04/21 1042      Chief Complaint  Patient presents with   Abdominal Pain   HPI This is a 31 year old G4, P0 at 30 weeks and 1 day with a pregnancy that is complicated by ulcerative colitis.  She presents with left upper abdominal pain that started yesterday.  She reports the pain is sharp and initially 7 out of 10.  It has increased since yesterday and is currently 9 out of 10.  If she is laying on her left side, the pain is less, but not gone.  No other palliative provoking factors.  Her appetite is normal.  She has been having diarrhea and loose stools for several days now.  She does report blood in her stools, but is not certain whether this is due to hemorrhoids from her ulcerative colitis.  The pain is not typical of her ulcerative colitis pain.  Usually her pain is more central abdomen with ulcerative colitis.  Additionally, when she develops ulcer colitis pain, she usually needs to use the bathroom fairly emergently which is not the case currently.  No other palliative factors.  OB History     Gravida  4   Para      Term      Preterm      AB  3   Living         SAB      IAB      Ectopic      Multiple      Live Births           Obstetric Comments  Procedure ab x3         Past Medical History:  Diagnosis Date   Anemia    Anxiety    Depression    Endometriosis    GERD (gastroesophageal reflux disease)    Ovarian cyst    Ulcerative colitis (HCC)    UTI (urinary tract infection)     Past Surgical History:  Procedure Laterality Date   HEMORRHOID BANDING     THERAPEUTIC ABORTION     x3    Family History  Problem Relation Age of Onset   Hyperlipidemia Mother    Diabetes Father    Hyperlipidemia Father    Hypertension Father    Sleep apnea Father    Diabetes Maternal Grandmother    Diabetes Paternal  Grandmother    Hypertension Paternal Grandmother    Heart disease Paternal Grandfather    Hypertension Paternal Grandfather    Kidney disease Paternal Grandfather    Diabetes Paternal Grandfather     Social History   Tobacco Use   Smoking status: Never   Smokeless tobacco: Never  Vaping Use   Vaping Use: Never used  Substance Use Topics   Alcohol use: Not Currently    Comment: occ   Drug use: Not Currently    Frequency: 3.0 times per week    Types: Marijuana    Comment: from time to time, hasn't in weeks  (7/31)    Allergies: No Known Allergies  Medications Prior to Admission  Medication Sig Dispense Refill Last Dose   Adalimumab (HUMIRA) 40 MG/0.4ML PSKT Inject 40 mg into the skin once a week. Every other week   08/25/2021   diphenhydramine-acetaminophen (TYLENOL PM) 25-500 MG TABS tablet Take 1 tablet by mouth at bedtime as needed.  09/03/2021   hydrocortisone 2.5 % cream Procto-Med HC 2.5 % topical cream perineal applicator  APPLY 1 APPLICATION TO AFFECTED AREA TWO TIMES DAILY   Past Month   Prenatal Vit-Fe Fumarate-FA (PRENATAL VITAMIN PO) Take 1 tablet by mouth daily.   09/04/2021   ferrous sulfate 325 (65 FE) MG tablet ferrous sulfate 325 mg (65 mg iron) tablet  TAKE 1 TABLET BY MOUTH EVERY DAY      gabapentin (NEURONTIN) 300 MG capsule Take by mouth. (Patient not taking: Reported on 07/19/2021)   More than a month   metroNIDAZOLE (FLAGYL) 500 MG tablet Take 1 tablet (500 mg total) by mouth 2 (two) times daily. 14 tablet 0    triamcinolone cream (KENALOG) 0.1 % Apply 1 application topically 2 (two) times daily. 30 g 0     Review of Systems Physical Exam   Blood pressure 122/66, pulse 98, temperature 98.3 F (36.8 C), temperature source Oral, resp. rate 16, height 5' 4.5" (1.638 m), weight 97.5 kg, last menstrual period 07/20/2020, SpO2 99 %.  Physical Exam Vitals reviewed. Exam conducted with a chaperone present.  Constitutional:      Appearance: She is  well-developed.  Cardiovascular:     Rate and Rhythm: Normal rate and regular rhythm.  Pulmonary:     Effort: Pulmonary effort is normal.  Abdominal:     General: Abdomen is flat. There is no distension.     Palpations: Abdomen is soft.     Tenderness: There is abdominal tenderness in the left upper quadrant. There is no right CVA tenderness, left CVA tenderness, guarding or rebound.  Skin:    General: Skin is warm and dry.     Capillary Refill: Capillary refill takes less than 2 seconds.  Neurological:     General: No focal deficit present.     Mental Status: She is alert.    Results for orders placed or performed during the hospital encounter of 09/04/21 (from the past 24 hour(s))  Urinalysis, Routine w reflex microscopic Urine, Clean Catch     Status: Abnormal   Collection Time: 09/04/21 10:26 AM  Result Value Ref Range   Color, Urine YELLOW YELLOW   APPearance CLOUDY (A) CLEAR   Specific Gravity, Urine 1.021 1.005 - 1.030   pH 6.0 5.0 - 8.0   Glucose, UA NEGATIVE NEGATIVE mg/dL   Hgb urine dipstick NEGATIVE NEGATIVE   Bilirubin Urine NEGATIVE NEGATIVE   Ketones, ur NEGATIVE NEGATIVE mg/dL   Protein, ur NEGATIVE NEGATIVE mg/dL   Nitrite NEGATIVE NEGATIVE   Leukocytes,Ua NEGATIVE NEGATIVE  Comprehensive metabolic panel     Status: Abnormal   Collection Time: 09/04/21 11:23 AM  Result Value Ref Range   Sodium 137 135 - 145 mmol/L   Potassium 3.5 3.5 - 5.1 mmol/L   Chloride 110 98 - 111 mmol/L   CO2 21 (L) 22 - 32 mmol/L   Glucose, Bld 78 70 - 99 mg/dL   BUN 9 6 - 20 mg/dL   Creatinine, Ser 2.42 0.44 - 1.00 mg/dL   Calcium 8.5 (L) 8.9 - 10.3 mg/dL   Total Protein 6.5 6.5 - 8.1 g/dL   Albumin 2.5 (L) 3.5 - 5.0 g/dL   AST 18 15 - 41 U/L   ALT 13 0 - 44 U/L   Alkaline Phosphatase 82 38 - 126 U/L   Total Bilirubin 0.3 0.3 - 1.2 mg/dL   GFR, Estimated >68 >34 mL/min   Anion gap 6 5 - 15  CBC  Status: Abnormal   Collection Time: 09/04/21 11:23 AM  Result Value Ref  Range   WBC 6.2 4.0 - 10.5 K/uL   RBC 3.82 (L) 3.87 - 5.11 MIL/uL   Hemoglobin 10.5 (L) 12.0 - 15.0 g/dL   HCT 71.6 (L) 96.7 - 89.3 %   MCV 84.6 80.0 - 100.0 fL   MCH 27.5 26.0 - 34.0 pg   MCHC 32.5 30.0 - 36.0 g/dL   RDW 81.0 17.5 - 10.2 %   Platelets 337 150 - 400 K/uL   nRBC 0.0 0.0 - 0.2 %  Lipase, blood     Status: None   Collection Time: 09/04/21 11:23 AM  Result Value Ref Range   Lipase 39 11 - 51 U/L     MAU Course  Procedures NST:  Baseline: 140  Variability: moderate Accelerations: ++  Decelerations: none Contractions: none  MDM Patient given dose of Bentyl while here.  Noted improvement of pain.  No longer sharp.  Labs appear normal  Assessment and Plan   1. [redacted] weeks gestation of pregnancy   2. Ulcerative pancolitis with rectal bleeding (HCC)    Improvement of pain with Bentyl, this is suggestive of GI.  No evidence of infection or uterine source. We will start prednisone 40 mg daily.  Patient to see her OB/GYN tomorrow.  Usually the steroids are tapered weekly by 5 to 10 mg depending on patient's symptoms.  We will also send prescription for Bentyl to the pharmacy.  She can use this as needed.  Hopefully, she will need less of this as the steroids start to kick in.  Levie Heritage 09/04/2021, 11:57 AM

## 2021-09-04 NOTE — MAU Note (Signed)
Robin Mora is a 31 y.o. at [redacted]w[redacted]d here in MAU reporting: LUQ pain for the past 2 days. States it is intense and is sharp and shooting. Is seeing some discharge but states was just tested for BV and it was negative. No LOF. No bleeding. Unsure about FM.   Onset of complaint: ongoing  Pain score: 9/10  Vitals:   09/04/21 1014  BP: 122/66  Pulse: 98  Resp: 16  Temp: 98.3 F (36.8 C)  SpO2: 99%     FHT:155  Lab orders placed from triage: UA

## 2021-10-12 ENCOUNTER — Inpatient Hospital Stay (HOSPITAL_COMMUNITY)
Admission: AD | Admit: 2021-10-12 | Discharge: 2021-10-12 | Disposition: A | Discharge status: Home / Self Care | Payer: BC Managed Care – PPO | Attending: Obstetrics and Gynecology | Admitting: Obstetrics and Gynecology

## 2021-10-12 ENCOUNTER — Encounter (HOSPITAL_COMMUNITY): Payer: Self-pay | Admitting: Obstetrics and Gynecology

## 2021-10-12 DIAGNOSIS — O26893 Other specified pregnancy related conditions, third trimester: Secondary | ICD-10-CM | POA: Diagnosis not present

## 2021-10-12 DIAGNOSIS — O99013 Anemia complicating pregnancy, third trimester: Secondary | ICD-10-CM

## 2021-10-12 DIAGNOSIS — Z79899 Other long term (current) drug therapy: Secondary | ICD-10-CM | POA: Insufficient documentation

## 2021-10-12 DIAGNOSIS — R519 Headache, unspecified: Secondary | ICD-10-CM | POA: Insufficient documentation

## 2021-10-12 DIAGNOSIS — Z3A35 35 weeks gestation of pregnancy: Secondary | ICD-10-CM | POA: Insufficient documentation

## 2021-10-12 DIAGNOSIS — Z3689 Encounter for other specified antenatal screening: Secondary | ICD-10-CM

## 2021-10-12 LAB — COMPREHENSIVE METABOLIC PANEL
ALT: 12 U/L (ref 0–44)
AST: 18 U/L (ref 15–41)
Albumin: 2.3 g/dL — ABNORMAL LOW (ref 3.5–5.0)
Alkaline Phosphatase: 111 U/L (ref 38–126)
Anion gap: 10 (ref 5–15)
BUN: 11 mg/dL (ref 6–20)
CO2: 20 mmol/L — ABNORMAL LOW (ref 22–32)
Calcium: 8.4 mg/dL — ABNORMAL LOW (ref 8.9–10.3)
Chloride: 107 mmol/L (ref 98–111)
Creatinine, Ser: 0.75 mg/dL (ref 0.44–1.00)
GFR, Estimated: >60 mL/min (ref >60)
Glucose, Bld: 67 mg/dL — ABNORMAL LOW (ref 70–99)
Potassium: 3.9 mmol/L (ref 3.5–5.1)
Sodium: 137 mmol/L (ref 135–145)
Total Bilirubin: 0.3 mg/dL (ref 0.3–1.2)
Total Protein: 6.1 g/dL — ABNORMAL LOW (ref 6.5–8.1)

## 2021-10-12 LAB — CBC
HCT: 32.3 % — ABNORMAL LOW (ref 36.0–46.0)
Hemoglobin: 10.5 g/dL — ABNORMAL LOW (ref 12.0–15.0)
MCH: 26.5 pg (ref 26.0–34.0)
MCHC: 32.5 g/dL (ref 30.0–36.0)
MCV: 81.6 fL (ref 80.0–100.0)
Platelets: 362 10*3/uL (ref 150–400)
RBC: 3.96 MIL/uL (ref 3.87–5.11)
RDW: 14.3 % (ref 11.5–15.5)
WBC: 8.9 10*3/uL (ref 4.0–10.5)
nRBC: 0 % (ref 0.0–0.2)

## 2021-10-12 LAB — URINALYSIS, ROUTINE W REFLEX MICROSCOPIC
Bilirubin Urine: NEGATIVE
Glucose, UA: NEGATIVE mg/dL
Hgb urine dipstick: NEGATIVE
Ketones, ur: NEGATIVE mg/dL
Leukocytes,Ua: NEGATIVE
Nitrite: NEGATIVE
Protein, ur: NEGATIVE mg/dL
Specific Gravity, Urine: 1.018 (ref 1.005–1.030)
pH: 6 (ref 5.0–8.0)

## 2021-10-12 LAB — PROTEIN / CREATININE RATIO, URINE
Creatinine, Urine: 108 mg/dL
Protein Creatinine Ratio: 0.08 mg/mg{Cre} (ref 0.00–0.15)
Total Protein, Urine: 9 mg/dL

## 2021-10-12 MED ORDER — CYCLOBENZAPRINE HCL 5 MG PO TABS
5.0000 mg | ORAL_TABLET | Freq: Once | ORAL | Status: AC
Start: 2021-10-12 — End: 2021-10-12
  Administered 2021-10-12: 5 mg via ORAL
  Filled 2021-10-12: qty 1

## 2021-10-12 NOTE — MAU Provider Note (Cosign Needed Addendum)
History     [id number]  Arrival date and time: 10/12/21 1624    Chief Complaint  Patient presents with   Headache   Leg Swelling   hands swollen    HPI Robin Mora is a 31 y.o. G4P0030 at [redacted]w[redacted]d with a pregnancy complicated by ulcerative colitis. On humira and takes prednisone during flares.  Here with a h/ache that started yesterday evening and has been persistent. Has had some stress at work, but no other concerns. Feels like a throbbing h/ache, frontal, nore on the Right side. Has taken 3 doses of tylenol since onset with no significant improvement.  She has had no viral URI type symptoms. No elevated blood pressures at home. Blood pressures today are in the 130s/80s. No prior concerns for elevated BP in this pregnancy. Recently had steroids for UC flare, slowly tapered and had the last dose about 1 week ago.  Review of outside prenatal records from [facility] (in media tab): yes. No significant concerns noted.  Vaginal bleeding: No LOF: No Fetal Movement: No Contractions: No    OB History     Gravida  4   Para      Term      Preterm      AB  3   Living         SAB      IAB      Ectopic      Multiple      Live Births           Obstetric Comments  Procedure ab x3         Past Medical History:  Diagnosis Date   Anemia    Anxiety    Depression    Endometriosis    GERD (gastroesophageal reflux disease)    Ovarian cyst    Ulcerative colitis (HCC)    UTI (urinary tract infection)     Past Surgical History:  Procedure Laterality Date   HEMORRHOID BANDING     THERAPEUTIC ABORTION     x3    Family History  Problem Relation Age of Onset   Hyperlipidemia Mother    Diabetes Father    Hyperlipidemia Father    Hypertension Father    Sleep apnea Father    Diabetes Maternal Grandmother    Diabetes Paternal Grandmother    Hypertension Paternal Grandmother    Heart disease Paternal Grandfather    Hypertension Paternal  Grandfather    Kidney disease Paternal Grandfather    Diabetes Paternal Grandfather     Social History   Socioeconomic History   Marital status: Single    Spouse name: n/a   Number of children: 0   Years of education: college   Highest education level: Not on file  Occupational History   Occupation: Runner, broadcasting/film/video    Comment: Administrator  Tobacco Use   Smoking status: Never   Smokeless tobacco: Never  Vaping Use   Vaping Use: Never used  Substance and Sexual Activity   Alcohol use: Not Currently    Comment: occ   Drug use: Not Currently    Frequency: 3.0 times per week    Types: Marijuana    Comment: from time to time, hasn't in weeks  (7/31)   Sexual activity: Yes    Partners: Male  Other Topics Concern   Not on file  Social History Narrative   In graduate program at [employer] and anticipates a Master's Degree in [place] and African American Literature 06/2015. Her  undergraduate degree in [employer] is from [employer].   Lives with her sister.   Social Determinants of Health   Financial Resource Strain: Not on file  Food Insecurity: Not on file  Transportation Needs: Not on file  Physical Activity: Not on file  Stress: Not on file  Social Connections: Not on file  Intimate Partner Violence: Not on file    No Known Allergies  No current facility-administered medications on file prior to encounter.   Current Outpatient Medications on File Prior to Encounter  Medication Sig Dispense Refill   Adalimumab (HUMIRA) 40 MG/0.4ML PSKT Inject 40 mg into the skin once a week. Every other week     predniSONE (DELTASONE) 20 MG tablet Take 40 mg by mouth daily with breakfast.     Prenatal Vit-Fe Fumarate-FA (PRENATAL VITAMIN PO) Take 1 tablet by mouth daily.     dicyclomine (BENTYL) 10 MG capsule Take 1 capsule (10 mg total) by mouth 4 (four) times daily -  before meals and at bedtime. 30 capsule 1   diphenhydramine-acetaminophen (TYLENOL PM) 25-500 MG TABS tablet Take 1  tablet by mouth at bedtime as needed.     ferrous sulfate 325 (65 FE) MG tablet ferrous sulfate 325 mg (65 mg iron) tablet  TAKE 1 TABLET BY MOUTH EVERY DAY     hydrocortisone 2.5 % cream Procto-Med HC 2.5 % topical cream perineal applicator  APPLY 1 APPLICATION TO AFFECTED AREA TWO TIMES DAILY     triamcinolone cream (KENALOG) 0.1 % Apply 1 application topically 2 (two) times daily. 30 g 0   Review of Systems  Constitutional:  Negative for chills and fever.  Eyes:  Positive for blurred vision (? flashes of light, been on all through pregnancy).  Cardiovascular:  Positive for leg swelling (b/l leg and hand swelling, new). Negative for chest pain and palpitations.  Gastrointestinal:  Negative for abdominal pain, nausea and vomiting.  Genitourinary:  Negative for dysuria and urgency.  Musculoskeletal:  Negative for myalgias.  Neurological:  Negative for dizziness and tingling.   Pertinent positives and negative per HPI, all others reviewed and negative  Physical Exam   BP 129/74   Pulse 82   Temp 98.6 F (37 C) (Oral)   Resp 20   Ht 5' 4.5" (1.638 m)   Wt 109.3 kg   LMP 07/20/2020 (Exact Date)   SpO2 99%   BMI 40.73 kg/m   Patient Vitals for the past 24 hrs:  BP Temp Temp src Pulse Resp SpO2 Height Weight  10/12/21 2030 129/74 -- -- 82 -- -- -- --  10/12/21 2015 126/69 -- -- 81 -- -- -- --  10/12/21 2000 (!) 113/59 -- -- 78 -- -- -- --  10/12/21 1945 122/68 -- -- 83 -- -- -- --  10/12/21 1931 123/64 -- -- 83 -- -- -- --  10/12/21 1916 126/78 -- -- 80 -- -- -- --  10/12/21 1900 (!) 133/91 -- -- 85 -- -- -- --  10/12/21 1846 119/62 -- -- 75 -- -- -- --  10/12/21 1844 117/67 -- -- 78 -- -- -- --  10/12/21 1721 132/88 -- -- 94 -- -- -- --  10/12/21 1707 -- 98.6 F (37 C) Oral -- -- -- -- --  10/12/21 1651 133/79 98.1 F (36.7 C) Oral 93 20 99 % 5' 4.5" (1.638 m) 109.3 kg    Physical Exam Vitals reviewed.  Constitutional:      General: She is not in acute distress.  Appearance: She is well-developed. She is not toxic-appearing.  HENT:     Head: Normocephalic and atraumatic.     Mouth/Throat:     Mouth: Mucous membranes are moist.  Eyes:     Extraocular Movements: Extraocular movements intact.  Cardiovascular:     Rate and Rhythm: Normal rate.  Pulmonary:     Effort: Pulmonary effort is normal. No respiratory distress.  Abdominal:     Palpations: Abdomen is soft.     Tenderness: There is no abdominal tenderness.     Comments: Gravid  Musculoskeletal:        General: Swelling (+trace b/l LE edema) present.  Skin:    General: Skin is warm and dry.  Neurological:     Mental Status: She is alert and oriented to person, place, and time.  Psychiatric:        Mood and Affect: Mood normal.        Behavior: Behavior normal.     FHT Baseline 140, moderate variability, >2 15 X15 accels, 1 variable decel (reactive before and after) Toco: no contractions Reactive NST  Labs Results for orders placed or performed during the hospital encounter of 10/12/21 (from the past 24 hour(s))  Urinalysis, Routine w reflex microscopic Urine, Clean Catch     Status: Abnormal   Collection Time: 10/12/21  5:24 PM  Result Value Ref Range   Color, Urine YELLOW YELLOW   APPearance HAZY (A) CLEAR   Specific Gravity, Urine 1.018 1.005 - 1.030   pH 6.0 5.0 - 8.0   Glucose, UA NEGATIVE NEGATIVE mg/dL   Hgb urine dipstick NEGATIVE NEGATIVE   Bilirubin Urine NEGATIVE NEGATIVE   Ketones, ur NEGATIVE NEGATIVE mg/dL   Protein, ur NEGATIVE NEGATIVE mg/dL   Nitrite NEGATIVE NEGATIVE   Leukocytes,Ua NEGATIVE NEGATIVE  Protein / creatinine ratio, urine     Status: None   Collection Time: 10/12/21  5:24 PM  Result Value Ref Range   Creatinine, Urine 108 mg/dL   Total Protein, Urine 9 mg/dL   Protein Creatinine Ratio 0.08 0.00 - 0.15 mg/mg[Cre]  CBC     Status: Abnormal   Collection Time: 10/12/21  6:53 PM  Result Value Ref Range   WBC 8.9 4.0 - 10.5 K/uL   RBC 3.96 3.87  - 5.11 MIL/uL   Hemoglobin 10.5 (L) 12.0 - 15.0 g/dL   HCT 84.1 (L) 66.0 - 63.0 %   MCV 81.6 80.0 - 100.0 fL   MCH 26.5 26.0 - 34.0 pg   MCHC 32.5 30.0 - 36.0 g/dL   RDW 16.0 10.9 - 32.3 %   Platelets 362 150 - 400 K/uL   nRBC 0.0 0.0 - 0.2 %  Comprehensive metabolic panel     Status: Abnormal   Collection Time: 10/12/21  6:53 PM  Result Value Ref Range   Sodium 137 135 - 145 mmol/L   Potassium 3.9 3.5 - 5.1 mmol/L   Chloride 107 98 - 111 mmol/L   CO2 20 (L) 22 - 32 mmol/L   Glucose, Bld 67 (L) 70 - 99 mg/dL   BUN 11 6 - 20 mg/dL   Creatinine, Ser 5.57 0.44 - 1.00 mg/dL   Calcium 8.4 (L) 8.9 - 10.3 mg/dL   Total Protein 6.1 (L) 6.5 - 8.1 g/dL   Albumin 2.3 (L) 3.5 - 5.0 g/dL   AST 18 15 - 41 U/L   ALT 12 0 - 44 U/L   Alkaline Phosphatase 111 38 - 126 U/L   Total Bilirubin 0.3  0.3 - 1.2 mg/dL   GFR, Estimated >53 >74 mL/min   Anion gap 10 5 - 15    Imaging No results found.  MAU Course  Procedures Lab Orders         Urinalysis, Routine w reflex microscopic Urine, Clean Catch         CBC         Comprehensive metabolic panel         Protein / creatinine ratio, urine     Meds ordered this encounter  Medications   cyclobenzaprine (FLEXERIL) tablet 5 mg   Imaging Orders  No imaging studies ordered today    MDM Moderate G4P0030 at [redacted]w[redacted]d with new onset h/ache, one episode of slightly elevated blood pressure here. Other blood pressure numbers have been normal. She had labs done to evaluate for pre-eclampsia.CBC, CMP are normal. Urine protein:creatinine ratio is pending.  She feels much better following a dose of flexeril.  #FWB NST: Reactive.  I transferred her care to Donette Larry, CNM  Chiagoziem Lizabeth Leyden, MD  Assumed care Labs and BPs reviewed. HA improved. No signs of PEC.  Stable for discharge home Assessment and Plan   1. [redacted] weeks gestation of pregnancy   2. NST (non-stress test) reactive   3. Nonintractable headache, unspecified chronicity  pattern, unspecified headache type   4. Anemia during pregnancy in third trimester    Discharge home Follow up at [facility] next week as scheduled Return precautions  Allergies as of 10/12/2021   No Known Allergies      Medication List     STOP taking these medications    predniSONE 20 MG tablet Commonly known as: DELTASONE   triamcinolone cream 0.1 % Commonly known as: KENALOG       TAKE these medications    dicyclomine 10 MG capsule Commonly known as: BENTYL Take 1 capsule (10 mg total) by mouth 4 (four) times daily -  before meals and at bedtime.   diphenhydramine-acetaminophen 25-500 MG Tabs tablet Commonly known as: TYLENOL PM Take 1 tablet by mouth at bedtime as needed.   ferrous sulfate 325 (65 FE) MG tablet ferrous sulfate 325 mg (65 mg iron) tablet  TAKE 1 TABLET BY MOUTH EVERY DAY   Humira 40 MG/0.4ML Pskt Generic drug: Adalimumab Inject 40 mg into the skin once a week. Every other week   hydrocortisone 2.5 % cream Procto-Med HC 2.5 % topical cream perineal applicator  APPLY 1 APPLICATION TO AFFECTED AREA TWO TIMES DAILY   PRENATAL VITAMIN PO Take 1 tablet by mouth daily.       Donette Larry, CNM  10/12/2021 9:22 PM

## 2021-10-12 NOTE — MAU Note (Signed)
Robin Mora is a 31 y.o. at [redacted]w[redacted]d here in MAU reporting: HA started last night, took some Tylenol, didn't help.  Woke up  with HA, hands and feet are swollen. Did not take any more Tylenol. Wondered if it was a reaction to the Humira injections or her BP.  BP had been good so for.  Blurred vision, denies epigastric pain.  No bleeding or leaking. Reports +FM   Onset of complaint: last night Pain score: 8 Vitals:   10/12/21 1651  BP: 133/79  Pulse: 93  Resp: 20  Temp: 98.1 F (36.7 C)  SpO2: 99%      Lab orders placed from triage:  urine collected

## 2021-10-17 LAB — OB RESULTS CONSOLE GBS: GBS: NEGATIVE

## 2021-10-27 ENCOUNTER — Inpatient Hospital Stay (HOSPITAL_COMMUNITY)
Admission: AD | Admit: 2021-10-27 | Discharge: 2021-10-28 | Disposition: A | Discharge status: Home / Self Care | Payer: BC Managed Care – PPO | Attending: Obstetrics and Gynecology | Admitting: Obstetrics and Gynecology

## 2021-10-27 ENCOUNTER — Encounter (HOSPITAL_COMMUNITY): Payer: Self-pay | Admitting: Obstetrics and Gynecology

## 2021-10-27 DIAGNOSIS — O26893 Other specified pregnancy related conditions, third trimester: Secondary | ICD-10-CM | POA: Diagnosis not present

## 2021-10-27 DIAGNOSIS — W19XXXA Unspecified fall, initial encounter: Secondary | ICD-10-CM | POA: Diagnosis not present

## 2021-10-27 DIAGNOSIS — X58XXXA Exposure to other specified factors, initial encounter: Secondary | ICD-10-CM | POA: Diagnosis not present

## 2021-10-27 DIAGNOSIS — Z20822 Contact with and (suspected) exposure to covid-19: Secondary | ICD-10-CM | POA: Insufficient documentation

## 2021-10-27 DIAGNOSIS — Z3A37 37 weeks gestation of pregnancy: Secondary | ICD-10-CM | POA: Diagnosis not present

## 2021-10-27 DIAGNOSIS — O368130 Decreased fetal movements, third trimester, not applicable or unspecified: Secondary | ICD-10-CM | POA: Insufficient documentation

## 2021-10-27 DIAGNOSIS — Z3689 Encounter for other specified antenatal screening: Secondary | ICD-10-CM | POA: Diagnosis not present

## 2021-10-27 LAB — CBC
HCT: 29.3 % — ABNORMAL LOW (ref 36.0–46.0)
Hemoglobin: 9.5 g/dL — ABNORMAL LOW (ref 12.0–15.0)
MCH: 26.5 pg (ref 26.0–34.0)
MCHC: 32.4 g/dL (ref 30.0–36.0)
MCV: 81.8 fL (ref 80.0–100.0)
Platelets: 286 10*3/uL (ref 150–400)
RBC: 3.58 MIL/uL — ABNORMAL LOW (ref 3.87–5.11)
RDW: 14.9 % (ref 11.5–15.5)
WBC: 6.6 10*3/uL (ref 4.0–10.5)
nRBC: 0 % (ref 0.0–0.2)

## 2021-10-27 LAB — RESP PANEL BY RT-PCR (FLU A&B, COVID) ARPGX2
Influenza A by PCR: NEGATIVE
Influenza B by PCR: NEGATIVE
SARS Coronavirus 2 by RT PCR: NEGATIVE

## 2021-10-27 LAB — COMPREHENSIVE METABOLIC PANEL
ALT: 13 U/L (ref 0–44)
AST: 21 U/L (ref 15–41)
Albumin: 2.2 g/dL — ABNORMAL LOW (ref 3.5–5.0)
Alkaline Phosphatase: 117 U/L (ref 38–126)
Anion gap: 4 — ABNORMAL LOW (ref 5–15)
BUN: 8 mg/dL (ref 6–20)
CO2: 22 mmol/L (ref 22–32)
Calcium: 8 mg/dL — ABNORMAL LOW (ref 8.9–10.3)
Chloride: 110 mmol/L (ref 98–111)
Creatinine, Ser: 0.82 mg/dL (ref 0.44–1.00)
GFR, Estimated: >60 mL/min (ref >60)
Glucose, Bld: 60 mg/dL — ABNORMAL LOW (ref 70–99)
Potassium: 3.5 mmol/L (ref 3.5–5.1)
Sodium: 136 mmol/L (ref 135–145)
Total Bilirubin: 0.4 mg/dL (ref 0.3–1.2)
Total Protein: 5.7 g/dL — ABNORMAL LOW (ref 6.5–8.1)

## 2021-10-27 LAB — PROTEIN / CREATININE RATIO, URINE
Creatinine, Urine: 77 mg/dL
Protein Creatinine Ratio: 0.08 mg/mg{Cre} (ref 0.00–0.15)
Total Protein, Urine: 6 mg/dL

## 2021-10-27 MED ORDER — CAFFEINE 200 MG PO TABS
200.0000 mg | ORAL_TABLET | Freq: Once | ORAL | Status: AC
  Administered 2021-10-27: 200 mg via ORAL
  Filled 2021-10-27: qty 1

## 2021-10-27 MED ORDER — ACETAMINOPHEN 325 MG PO TABS
650.0000 mg | ORAL_TABLET | Freq: Once | ORAL | Status: AC
  Administered 2021-10-27: 650 mg via ORAL
  Filled 2021-10-27: qty 2

## 2021-10-27 NOTE — MAU Note (Signed)
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

## 2021-10-27 NOTE — MAU Provider Note (Cosign Needed Addendum)
Chief Complaint:  Fall, Nasal Congestion, and Headache  HPI: Robin Mora is a 31 y.o. G4P0030 at [redacted]w[redacted]d who presents to maternity admissions reporting headache of 5/10 (no visual disturbances or dizziness) with nasal congestion, back pain from a fall at 1430 (not on her belly, fell backwards in a chair) and decreased fetal movement since the fall. Denies vaginal bleeding or cramping, just having some tightness. No other physical complaints except regular swelling of her hands and feet (goes away with elevation).   Pregnancy Course: Receives prenatal care from [facility], prenatal records reviewed.  Past Medical History:  Diagnosis Date   Anemia    Anxiety    Depression    Endometriosis    GERD (gastroesophageal reflux disease)    Ovarian cyst    Ulcerative colitis ([place])    UTI (urinary tract infection)    OB History  Gravida Para Term Preterm AB Living  4       3    SAB IAB Ectopic Multiple Live Births               # Outcome Date GA Lbr Len/2nd Weight Sex Delivery Anes PTL Lv  4 Current           3 AB           2 AB           1 AB             Obstetric Comments  Procedure ab x3   Past Surgical History:  Procedure Laterality Date   HEMORRHOID BANDING     THERAPEUTIC ABORTION     x3   Family History  Problem Relation Age of Onset   Hyperlipidemia Mother    Diabetes Father    Hyperlipidemia Father    Hypertension Father    Sleep apnea Father    Diabetes Maternal Grandmother    Diabetes Paternal Grandmother    Hypertension Paternal Grandmother    Heart disease Paternal Grandfather    Hypertension Paternal Grandfather    Kidney disease Paternal Grandfather    Diabetes Paternal Grandfather    Social History   Tobacco Use   Smoking status: Never   Smokeless tobacco: Never  Vaping Use   Vaping Use: Never used  Substance Use Topics   Alcohol use: Not Currently    Comment: occ   Drug use: Not Currently    Frequency: 3.0 times per week     Types: Marijuana    Comment: from time to time, hasn't in weeks  (7/31)   No Known Allergies Medications Prior to Admission  Medication Sig Dispense Refill Last Dose   diphenhydramine-acetaminophen (TYLENOL PM) 25-500 MG TABS tablet Take 1 tablet by mouth at bedtime as needed.   10/27/2021 at 1800   Prenatal Vit-Fe Fumarate-FA (PRENATAL VITAMIN PO) Take 1 tablet by mouth daily.   10/27/2021   Adalimumab (HUMIRA) 40 MG/0.4ML PSKT Inject 40 mg into the skin once a week. Every other week   More than a month   dicyclomine (BENTYL) 10 MG capsule Take 1 capsule (10 mg total) by mouth 4 (four) times daily -  before meals and at bedtime. 30 capsule 1    ferrous sulfate 325 (65 FE) MG tablet ferrous sulfate 325 mg (65 mg iron) tablet  TAKE 1 TABLET BY MOUTH EVERY DAY   More than a month   hydrocortisone 2.5 % cream Procto-Med HC 2.5 % topical cream perineal applicator  APPLY 1 APPLICATION TO  AFFECTED AREA TWO TIMES DAILY      I have reviewed patient's Past Medical Hx, Surgical Hx, Family Hx, Social Hx, medications and allergies.   ROS:  Pertinent items noted in HPI and remainder of comprehensive ROS otherwise negative.   Physical Exam  Patient Vitals for the past 24 hrs:  BP Temp Temp src Pulse Resp SpO2 Height Weight  10/27/21 2343 128/66 -- Oral 65 18 100 % -- --  10/27/21 2130 (!) 105/58 -- -- 69 -- -- -- --  10/27/21 2115 106/63 -- -- 94 -- -- -- --  10/27/21 2100 (!) 112/53 -- -- 72 -- -- -- --  10/27/21 2045 130/76 -- -- 78 -- -- -- --  10/27/21 2030 124/80 -- -- 80 -- 100 % -- --  10/27/21 2015 (!) 141/87 -- -- 79 -- 99 % -- --  10/27/21 2000 (!) 143/81 -- -- 77 -- 99 % -- --  10/27/21 1959 (!) 143/81 -- -- 75 20 100 % -- --  10/27/21 1931 130/80 98 F (36.7 C) Oral 71 20 -- 5' 4.5" (1.638 m) 109.9 kg   Constitutional: Well-developed, well-nourished female in no acute distress.  Cardiovascular: normal rate & rhythm, warm and well-perfused Respiratory: normal effort, no problems with  respiration noted GI: Abd soft, non-tender, gravid appropriate for gestational age MS: Extremities nontender, no edema, normal ROM Neurologic: Alert and oriented x 4.  GU: no CVA tenderness Pelvic: exam deferred  Fetal Tracing: reactive Baseline: 135 Variability: moderate Accelerations: 15x15 Decelerations: none Toco: none   Feeling improved movement once on monitors in MAU   Labs: Results for orders placed or performed during the hospital encounter of 10/27/21 (from the past 24 hour(s))  Resp Panel by RT-PCR (Flu A&B, Covid) Anterior Nasal Swab     Status: None   Collection Time: 10/27/21  8:24 PM   Specimen: Anterior Nasal Swab  Result Value Ref Range   SARS Coronavirus 2 by RT PCR NEGATIVE NEGATIVE   Influenza A by PCR NEGATIVE NEGATIVE   Influenza B by PCR NEGATIVE NEGATIVE  Protein / creatinine ratio, urine     Status: None   Collection Time: 10/27/21  8:29 PM  Result Value Ref Range   Creatinine, Urine 77 mg/dL   Total Protein, Urine 6 mg/dL   Protein Creatinine Ratio 0.08 0.00 - 0.15 mg/mg[Cre]  CBC     Status: Abnormal   Collection Time: 10/27/21  8:44 PM  Result Value Ref Range   WBC 6.6 4.0 - 10.5 K/uL   RBC 3.58 (L) 3.87 - 5.11 MIL/uL   Hemoglobin 9.5 (L) 12.0 - 15.0 g/dL   HCT 54.6 (L) 27.0 - 35.0 %   MCV 81.8 80.0 - 100.0 fL   MCH 26.5 26.0 - 34.0 pg   MCHC 32.4 30.0 - 36.0 g/dL   RDW 09.3 81.8 - 29.9 %   Platelets 286 150 - 400 K/uL   nRBC 0.0 0.0 - 0.2 %  Comprehensive metabolic panel     Status: Abnormal   Collection Time: 10/27/21  8:44 PM  Result Value Ref Range   Sodium 136 135 - 145 mmol/L   Potassium 3.5 3.5 - 5.1 mmol/L   Chloride 110 98 - 111 mmol/L   CO2 22 22 - 32 mmol/L   Glucose, Bld 60 (L) 70 - 99 mg/dL   BUN 8 6 - 20 mg/dL   Creatinine, Ser 3.71 0.44 - 1.00 mg/dL   Calcium 8.0 (L) 8.9 - 10.3 mg/dL  Total Protein 5.7 (L) 6.5 - 8.1 g/dL   Albumin 2.2 (L) 3.5 - 5.0 g/dL   AST 21 15 - 41 U/L   ALT 13 0 - 44 U/L   Alkaline  Phosphatase 117 38 - 126 U/L   Total Bilirubin 0.4 0.3 - 1.2 mg/dL   GFR, Estimated >67 >61 mL/min   Anion gap 4 (L) 5 - 15   Imaging:  No results found.  MAU Course: Orders Placed This Encounter  Procedures   Resp Panel by RT-PCR (Flu A&B, Covid) Anterior Nasal Swab   CBC   Comprehensive metabolic panel   Protein / creatinine ratio, urine   Discharge patient   Meds ordered this encounter  Medications   acetaminophen (TYLENOL) tablet 650 mg   caffeine tablet 200 mg   MDM: Informed pt of need to monitor baby for the next 4hrs while treating her headache and getting labs.  Denies pain other than in her head and low back, took 325mg  Tylenol at 6pm. Given 650mg  Tylenol + 200mg  caffeine with good relief of headache.  BP initially elevated so PEC workup started.  Care turned over to , CNM at 2155.  , CNM, MSN, IBCLC Certified Nurse Midwife, [facility]  2340: FHR has remained reactive for the entire 4 hours of monitoring. No contractions tracing.     Assessment: 1. Fall, initial encounter   2. [redacted] weeks gestation of pregnancy   3. NST (non-stress test) reactive     Plan:  DC home in stable condition  Comfort measures reviewed  3rd Trimester precautions  Labor precautions  Fetal kick counts RX: none  Return to MAU as needed FU with OB as planned   Follow-up Information     [facility] Follow up.   Specialty: Obstetrics and Gynecology Contact information: [address] [facility] [address] [phone]               [place] DNP, CNM  10/27/21  11:48 PM

## 2021-10-31 ENCOUNTER — Other Ambulatory Visit: Payer: Self-pay

## 2021-10-31 ENCOUNTER — Inpatient Hospital Stay (HOSPITAL_COMMUNITY): Payer: BC Managed Care – PPO | Admitting: Anesthesiology

## 2021-10-31 ENCOUNTER — Encounter (HOSPITAL_COMMUNITY)
Admission: AD | Disposition: A | Discharge status: Home / Self Care | Payer: Self-pay | Source: Home / Self Care | Attending: Obstetrics and Gynecology

## 2021-10-31 ENCOUNTER — Encounter (HOSPITAL_COMMUNITY): Payer: Self-pay | Admitting: Obstetrics & Gynecology

## 2021-10-31 ENCOUNTER — Inpatient Hospital Stay (HOSPITAL_COMMUNITY)
Admission: AD | Admit: 2021-10-31 | Discharge: 2021-11-03 | DRG: 787 | Disposition: A | Discharge status: Home / Self Care | Payer: BC Managed Care – PPO | Attending: Obstetrics and Gynecology | Admitting: Obstetrics and Gynecology

## 2021-10-31 DIAGNOSIS — K5190 Ulcerative colitis, unspecified, without complications: Secondary | ICD-10-CM | POA: Diagnosis present

## 2021-10-31 DIAGNOSIS — O26893 Other specified pregnancy related conditions, third trimester: Secondary | ICD-10-CM | POA: Diagnosis present

## 2021-10-31 DIAGNOSIS — Z3A38 38 weeks gestation of pregnancy: Secondary | ICD-10-CM

## 2021-10-31 DIAGNOSIS — O4202 Full-term premature rupture of membranes, onset of labor within 24 hours of rupture: Secondary | ICD-10-CM

## 2021-10-31 DIAGNOSIS — O9962 Diseases of the digestive system complicating childbirth: Secondary | ICD-10-CM | POA: Diagnosis present

## 2021-10-31 DIAGNOSIS — Z8759 Personal history of other complications of pregnancy, childbirth and the puerperium: Secondary | ICD-10-CM

## 2021-10-31 DIAGNOSIS — O134 Gestational [pregnancy-induced] hypertension without significant proteinuria, complicating childbirth: Principal | ICD-10-CM | POA: Diagnosis present

## 2021-10-31 DIAGNOSIS — O99214 Obesity complicating childbirth: Secondary | ICD-10-CM | POA: Diagnosis present

## 2021-10-31 DIAGNOSIS — O133 Gestational [pregnancy-induced] hypertension without significant proteinuria, third trimester: Secondary | ICD-10-CM

## 2021-10-31 LAB — CBC
HCT: 35.2 % — ABNORMAL LOW (ref 36.0–46.0)
Hemoglobin: 11.3 g/dL — ABNORMAL LOW (ref 12.0–15.0)
MCH: 26.3 pg (ref 26.0–34.0)
MCHC: 32.1 g/dL (ref 30.0–36.0)
MCV: 82.1 fL (ref 80.0–100.0)
Platelets: 357 10*3/uL (ref 150–400)
RBC: 4.29 MIL/uL (ref 3.87–5.11)
RDW: 15.5 % (ref 11.5–15.5)
WBC: 6.8 10*3/uL (ref 4.0–10.5)
nRBC: 0 % (ref 0.0–0.2)

## 2021-10-31 LAB — COMPREHENSIVE METABOLIC PANEL
ALT: 12 U/L (ref 0–44)
AST: 24 U/L (ref 15–41)
Albumin: 2.6 g/dL — ABNORMAL LOW (ref 3.5–5.0)
Alkaline Phosphatase: 135 U/L — ABNORMAL HIGH (ref 38–126)
Anion gap: 5 (ref 5–15)
BUN: 7 mg/dL (ref 6–20)
CO2: 20 mmol/L — ABNORMAL LOW (ref 22–32)
Calcium: 8.4 mg/dL — ABNORMAL LOW (ref 8.9–10.3)
Chloride: 109 mmol/L (ref 98–111)
Creatinine, Ser: 0.68 mg/dL (ref 0.44–1.00)
GFR, Estimated: >60 mL/min (ref >60)
Glucose, Bld: 70 mg/dL (ref 70–99)
Potassium: 3.9 mmol/L (ref 3.5–5.1)
Sodium: 134 mmol/L — ABNORMAL LOW (ref 135–145)
Total Bilirubin: 0.5 mg/dL (ref 0.3–1.2)
Total Protein: 6.7 g/dL (ref 6.5–8.1)

## 2021-10-31 LAB — RPR: RPR Ser Ql: NONREACTIVE

## 2021-10-31 LAB — POCT FERN TEST: POCT Fern Test: POSITIVE

## 2021-10-31 LAB — TYPE AND SCREEN
ABO/RH(D): B POS
Antibody Screen: NEGATIVE

## 2021-10-31 LAB — PROTEIN / CREATININE RATIO, URINE
Creatinine, Urine: 80 mg/dL
Protein Creatinine Ratio: 0.1 mg/mg{Cre} (ref 0.00–0.15)
Total Protein, Urine: 8 mg/dL

## 2021-10-31 SURGERY — Surgical Case
Anesthesia: Epidural

## 2021-10-31 MED ORDER — LIDOCAINE HCL (PF) 1 % IJ SOLN
INTRAMUSCULAR | Status: DC | PRN
  Administered 2021-10-31: 10 mL via EPIDURAL
  Administered 2021-10-31: 2 mL via EPIDURAL

## 2021-10-31 MED ORDER — TERBUTALINE SULFATE 1 MG/ML IJ SOLN
0.2500 mg | Freq: Once | INTRAMUSCULAR | Status: DC | PRN
  Filled 2021-10-31: qty 1

## 2021-10-31 MED ORDER — LACTATED RINGERS IV SOLN: 500.0000 mL | INTRAVENOUS | Status: DC | PRN

## 2021-10-31 MED ORDER — PHENYLEPHRINE 80 MCG/ML (10ML) SYRINGE FOR IV PUSH (FOR BLOOD PRESSURE SUPPORT): 80.0000 ug | PREFILLED_SYRINGE | INTRAVENOUS | Status: DC | PRN

## 2021-10-31 MED ORDER — LACTATED RINGERS IV SOLN: INTRAVENOUS | Status: DC

## 2021-10-31 MED ORDER — ACETAMINOPHEN 325 MG PO TABS
650.0000 mg | ORAL_TABLET | ORAL | Status: DC | PRN
  Administered 2021-10-31: 650 mg via ORAL
  Filled 2021-10-31: qty 2

## 2021-10-31 MED ORDER — SODIUM CHLORIDE 0.9% FLUSH: 3.0000 mL | INTRAVENOUS | Status: DC | PRN

## 2021-10-31 MED ORDER — MISOPROSTOL 50MCG HALF TABLET
50.0000 ug | ORAL_TABLET | Freq: Once | ORAL | Status: AC
  Administered 2021-10-31: 50 ug via BUCCAL
  Filled 2021-10-31: qty 1

## 2021-10-31 MED ORDER — OXYTOCIN-SODIUM CHLORIDE 30-0.9 UT/500ML-% IV SOLN: 2.5000 [IU]/h | INTRAVENOUS | Status: AC

## 2021-10-31 MED ORDER — DEXAMETHASONE SODIUM PHOSPHATE 10 MG/ML IJ SOLN
INTRAMUSCULAR | Status: AC
  Filled 2021-10-31: qty 1

## 2021-10-31 MED ORDER — OXYTOCIN BOLUS FROM INFUSION: 333.0000 mL | Freq: Once | INTRAVENOUS | Status: DC

## 2021-10-31 MED ORDER — OXYCODONE HCL 5 MG PO TABS: 5.0000 mg | ORAL_TABLET | Freq: Once | ORAL | Status: DC | PRN

## 2021-10-31 MED ORDER — PHENYLEPHRINE HCL (PRESSORS) 10 MG/ML IV SOLN
INTRAVENOUS | Status: DC | PRN
  Administered 2021-10-31 (×3): 160 ug via INTRAVENOUS

## 2021-10-31 MED ORDER — OXYTOCIN-SODIUM CHLORIDE 30-0.9 UT/500ML-% IV SOLN
INTRAVENOUS | Status: AC
  Filled 2021-10-31: qty 500

## 2021-10-31 MED ORDER — EPHEDRINE 5 MG/ML INJ: 10.0000 mg | INTRAVENOUS | Status: DC | PRN

## 2021-10-31 MED ORDER — SODIUM CHLORIDE 0.9 % IV SOLN: 12.5000 mg | Freq: Once | INTRAVENOUS | Status: DC | PRN

## 2021-10-31 MED ORDER — SIMETHICONE 80 MG PO CHEW
80.0000 mg | CHEWABLE_TABLET | Freq: Three times a day (TID) | ORAL | Status: DC
  Administered 2021-11-01 – 2021-11-03 (×6): 80 mg via ORAL
  Filled 2021-10-31 (×6): qty 1

## 2021-10-31 MED ORDER — NALOXONE HCL 4 MG/10ML IJ SOLN: 1.0000 ug/kg/h | INTRAVENOUS | Status: DC | PRN

## 2021-10-31 MED ORDER — DIPHENHYDRAMINE HCL 25 MG PO CAPS: 25.0000 mg | ORAL_CAPSULE | ORAL | Status: DC | PRN

## 2021-10-31 MED ORDER — LACTATED RINGERS IV SOLN: INTRAVENOUS | Status: DC | PRN

## 2021-10-31 MED ORDER — HYDROMORPHONE HCL 1 MG/ML IJ SOLN
INTRAMUSCULAR | Status: AC
  Filled 2021-10-31: qty 0.5

## 2021-10-31 MED ORDER — PHENYLEPHRINE 80 MCG/ML (10ML) SYRINGE FOR IV PUSH (FOR BLOOD PRESSURE SUPPORT)
PREFILLED_SYRINGE | INTRAVENOUS | Status: AC
  Filled 2021-10-31: qty 10

## 2021-10-31 MED ORDER — KETOROLAC TROMETHAMINE 30 MG/ML IJ SOLN
30.0000 mg | Freq: Once | INTRAMUSCULAR | Status: AC
  Administered 2021-10-31: 30 mg via INTRAMUSCULAR

## 2021-10-31 MED ORDER — ACETAMINOPHEN 10 MG/ML IV SOLN
INTRAVENOUS | Status: AC
  Filled 2021-10-31: qty 100

## 2021-10-31 MED ORDER — ONDANSETRON HCL 4 MG/2ML IJ SOLN
INTRAMUSCULAR | Status: AC
  Filled 2021-10-31: qty 2

## 2021-10-31 MED ORDER — ACETAMINOPHEN 325 MG PO TABS
650.0000 mg | ORAL_TABLET | ORAL | Status: DC | PRN
  Administered 2021-11-01 – 2021-11-03 (×8): 650 mg via ORAL
  Filled 2021-10-31 (×9): qty 2

## 2021-10-31 MED ORDER — KETOROLAC TROMETHAMINE 30 MG/ML IJ SOLN: 30.0000 mg | Freq: Four times a day (QID) | INTRAMUSCULAR | Status: AC | PRN

## 2021-10-31 MED ORDER — DIPHENHYDRAMINE HCL 25 MG PO CAPS
25.0000 mg | ORAL_CAPSULE | Freq: Four times a day (QID) | ORAL | Status: DC | PRN
  Administered 2021-11-01: 25 mg via ORAL
  Filled 2021-10-31: qty 1

## 2021-10-31 MED ORDER — OXYTOCIN-SODIUM CHLORIDE 30-0.9 UT/500ML-% IV SOLN
1.0000 m[IU]/min | INTRAVENOUS | Status: DC
  Administered 2021-10-31: 2 m[IU]/min via INTRAVENOUS

## 2021-10-31 MED ORDER — OXYTOCIN-SODIUM CHLORIDE 30-0.9 UT/500ML-% IV SOLN
2.5000 [IU]/h | INTRAVENOUS | Status: DC
  Filled 2021-10-31: qty 500

## 2021-10-31 MED ORDER — OXYCODONE HCL 5 MG PO TABS
5.0000 mg | ORAL_TABLET | ORAL | Status: DC | PRN
  Administered 2021-11-01 – 2021-11-03 (×7): 5 mg via ORAL
  Filled 2021-10-31 (×7): qty 1

## 2021-10-31 MED ORDER — OXYTOCIN-SODIUM CHLORIDE 30-0.9 UT/500ML-% IV SOLN
INTRAVENOUS | Status: DC | PRN
  Administered 2021-10-31: 100 mL via INTRAVENOUS
  Administered 2021-10-31: 500 mL via INTRAVENOUS

## 2021-10-31 MED ORDER — ONDANSETRON HCL 4 MG/2ML IJ SOLN: 4.0000 mg | Freq: Four times a day (QID) | INTRAMUSCULAR | Status: DC | PRN

## 2021-10-31 MED ORDER — OXYCODONE HCL 5 MG/5ML PO SOLN: 5.0000 mg | Freq: Once | ORAL | Status: DC | PRN

## 2021-10-31 MED ORDER — LACTATED RINGERS IV SOLN
500.0000 mL | Freq: Once | INTRAVENOUS | Status: AC
  Administered 2021-10-31: 500 mL via INTRAVENOUS

## 2021-10-31 MED ORDER — ZOLPIDEM TARTRATE 5 MG PO TABS: 5.0000 mg | ORAL_TABLET | Freq: Every evening | ORAL | Status: DC | PRN

## 2021-10-31 MED ORDER — LIDOCAINE HCL (PF) 1 % IJ SOLN: 30.0000 mL | INTRAMUSCULAR | Status: DC | PRN

## 2021-10-31 MED ORDER — SOD CITRATE-CITRIC ACID 500-334 MG/5ML PO SOLN: 30.0000 mL | ORAL | Status: DC | PRN

## 2021-10-31 MED ORDER — ACETAMINOPHEN 10 MG/ML IV SOLN
1000.0000 mg | Freq: Once | INTRAVENOUS | Status: DC | PRN
  Administered 2021-10-31: 1000 mg via INTRAVENOUS

## 2021-10-31 MED ORDER — FENTANYL CITRATE (PF) 100 MCG/2ML IJ SOLN
100.0000 ug | INTRAMUSCULAR | Status: DC | PRN
  Administered 2021-10-31 (×3): 100 ug via INTRAVENOUS
  Filled 2021-10-31 (×3): qty 2

## 2021-10-31 MED ORDER — MENTHOL 3 MG MT LOZG: 1.0000 | LOZENGE | OROMUCOSAL | Status: DC | PRN

## 2021-10-31 MED ORDER — CHLOROPROCAINE HCL (PF) 3 % IJ SOLN
INTRAMUSCULAR | Status: AC
  Filled 2021-10-31: qty 20

## 2021-10-31 MED ORDER — IBUPROFEN 600 MG PO TABS: 600.0000 mg | ORAL_TABLET | Freq: Four times a day (QID) | ORAL | Status: DC | PRN

## 2021-10-31 MED ORDER — KETOROLAC TROMETHAMINE 30 MG/ML IJ SOLN
INTRAMUSCULAR | Status: AC
  Filled 2021-10-31: qty 1

## 2021-10-31 MED ORDER — MEPERIDINE HCL 25 MG/ML IJ SOLN
INTRAMUSCULAR | Status: AC
  Filled 2021-10-31: qty 1

## 2021-10-31 MED ORDER — FLEET ENEMA 7-19 GM/118ML RE ENEM: 1.0000 | ENEMA | RECTAL | Status: DC | PRN

## 2021-10-31 MED ORDER — CEFAZOLIN SODIUM-DEXTROSE 2-3 GM-%(50ML) IV SOLR
INTRAVENOUS | Status: DC | PRN
  Administered 2021-10-31: 2 g via INTRAVENOUS

## 2021-10-31 MED ORDER — DIPHENHYDRAMINE HCL 50 MG/ML IJ SOLN: 12.5000 mg | INTRAMUSCULAR | Status: DC | PRN

## 2021-10-31 MED ORDER — WITCH HAZEL-GLYCERIN EX PADS: 1.0000 | MEDICATED_PAD | CUTANEOUS | Status: DC | PRN

## 2021-10-31 MED ORDER — SCOPOLAMINE 1 MG/3DAYS TD PT72
1.0000 | MEDICATED_PATCH | Freq: Once | TRANSDERMAL | Status: DC
  Administered 2021-10-31: 1.5 mg via TRANSDERMAL

## 2021-10-31 MED ORDER — MISOPROSTOL 25 MCG QUARTER TABLET
25.0000 ug | ORAL_TABLET | Freq: Once | ORAL | Status: AC
  Administered 2021-10-31: 25 ug via ORAL
  Filled 2021-10-31: qty 1

## 2021-10-31 MED ORDER — KETOROLAC TROMETHAMINE 30 MG/ML IJ SOLN
30.0000 mg | Freq: Four times a day (QID) | INTRAMUSCULAR | Status: AC | PRN
  Administered 2021-11-01 (×3): 30 mg via INTRAVENOUS
  Filled 2021-10-31 (×3): qty 1

## 2021-10-31 MED ORDER — FENTANYL-BUPIVACAINE-NACL 0.5-0.125-0.9 MG/250ML-% EP SOLN
12.0000 mL/h | EPIDURAL | Status: DC | PRN
  Administered 2021-10-31: 12 mL/h via EPIDURAL
  Filled 2021-10-31: qty 250

## 2021-10-31 MED ORDER — DEXAMETHASONE SODIUM PHOSPHATE 10 MG/ML IJ SOLN
INTRAMUSCULAR | Status: DC | PRN
  Administered 2021-10-31: 10 mg via INTRAVENOUS

## 2021-10-31 MED ORDER — MEPERIDINE HCL 25 MG/ML IJ SOLN
INTRAMUSCULAR | Status: DC | PRN
  Administered 2021-10-31 (×2): 12.5 mg via INTRAVENOUS

## 2021-10-31 MED ORDER — CEFAZOLIN SODIUM-DEXTROSE 2-4 GM/100ML-% IV SOLN
INTRAVENOUS | Status: AC
  Filled 2021-10-31: qty 100

## 2021-10-31 MED ORDER — SENNOSIDES-DOCUSATE SODIUM 8.6-50 MG PO TABS
2.0000 | ORAL_TABLET | Freq: Every day | ORAL | Status: DC
  Administered 2021-11-01 – 2021-11-03 (×3): 2 via ORAL
  Filled 2021-10-31 (×3): qty 2

## 2021-10-31 MED ORDER — MEPERIDINE HCL 25 MG/ML IJ SOLN: 6.2500 mg | INTRAMUSCULAR | Status: DC | PRN

## 2021-10-31 MED ORDER — CHLOROPROCAINE HCL (PF) 3 % IJ SOLN
INTRAMUSCULAR | Status: DC | PRN
  Administered 2021-10-31: 5 mL
  Administered 2021-10-31: 15 mL

## 2021-10-31 MED ORDER — OXYCODONE-ACETAMINOPHEN 5-325 MG PO TABS: 2.0000 | ORAL_TABLET | ORAL | Status: DC | PRN

## 2021-10-31 MED ORDER — MORPHINE SULFATE (PF) 0.5 MG/ML IJ SOLN
INTRAMUSCULAR | Status: AC
  Filled 2021-10-31: qty 10

## 2021-10-31 MED ORDER — MORPHINE SULFATE (PF) 0.5 MG/ML IJ SOLN
INTRAMUSCULAR | Status: DC | PRN
  Administered 2021-10-31: 3 mg via EPIDURAL

## 2021-10-31 MED ORDER — NALOXONE HCL 0.4 MG/ML IJ SOLN: 0.4000 mg | INTRAMUSCULAR | Status: DC | PRN

## 2021-10-31 MED ORDER — PRENATAL MULTIVITAMIN CH
1.0000 | ORAL_TABLET | Freq: Every day | ORAL | Status: DC
  Administered 2021-11-01 – 2021-11-02 (×2): 1 via ORAL
  Filled 2021-10-31 (×2): qty 1

## 2021-10-31 MED ORDER — PROMETHAZINE HCL 25 MG/ML IJ SOLN: 6.2500 mg | INTRAMUSCULAR | Status: DC | PRN

## 2021-10-31 MED ORDER — ONDANSETRON HCL 4 MG/2ML IJ SOLN: 4.0000 mg | Freq: Three times a day (TID) | INTRAMUSCULAR | Status: DC | PRN

## 2021-10-31 MED ORDER — COCONUT OIL OIL: 1.0000 | TOPICAL_OIL | Status: DC | PRN

## 2021-10-31 MED ORDER — SIMETHICONE 80 MG PO CHEW: 80.0000 mg | CHEWABLE_TABLET | ORAL | Status: DC | PRN

## 2021-10-31 MED ORDER — DIBUCAINE (PERIANAL) 1 % EX OINT: 1.0000 | TOPICAL_OINTMENT | CUTANEOUS | Status: DC | PRN

## 2021-10-31 MED ORDER — ONDANSETRON HCL 4 MG/2ML IJ SOLN
INTRAMUSCULAR | Status: DC | PRN
  Administered 2021-10-31: 4 mg via INTRAVENOUS

## 2021-10-31 MED ORDER — HYDROMORPHONE HCL 1 MG/ML IJ SOLN
0.2500 mg | INTRAMUSCULAR | Status: DC | PRN
  Administered 2021-10-31: 0.5 mg via INTRAVENOUS

## 2021-10-31 MED ORDER — SCOPOLAMINE 1 MG/3DAYS TD PT72
MEDICATED_PATCH | TRANSDERMAL | Status: AC
  Filled 2021-10-31: qty 1

## 2021-10-31 MED ORDER — OXYCODONE-ACETAMINOPHEN 5-325 MG PO TABS: 1.0000 | ORAL_TABLET | ORAL | Status: DC | PRN

## 2021-10-31 SURGICAL SUPPLY — 36 items
BENZOIN TINCTURE PRP APPL 2/3 (GAUZE/BANDAGES/DRESSINGS) ×1 IMPLANT
CHLORAPREP W/TINT 26ML (MISCELLANEOUS) ×2 IMPLANT
CLAMP UMBILICAL CORD (MISCELLANEOUS) ×1 IMPLANT
CLOTH BEACON ORANGE TIMEOUT ST (SAFETY) ×1 IMPLANT
DRAIN JACKSON PRT FLT 10 (DRAIN) IMPLANT
DRSG OPSITE POSTOP 4X10 (GAUZE/BANDAGES/DRESSINGS) ×1 IMPLANT
ELECT REM PT RETURN 9FT ADLT (ELECTROSURGICAL) ×1
ELECTRODE REM PT RTRN 9FT ADLT (ELECTROSURGICAL) ×1 IMPLANT
EVACUATOR SILICONE 100CC (DRAIN) IMPLANT
EXTRACTOR VACUUM M CUP 4 TUBE (SUCTIONS) IMPLANT
GLOVE BIO SURGEON STRL SZ 6.5 (GLOVE) ×1 IMPLANT
GLOVE BIOGEL PI IND STRL 7.0 (GLOVE) ×2 IMPLANT
GOWN STRL REUS W/TWL LRG LVL3 (GOWN DISPOSABLE) ×2 IMPLANT
KIT ABG SYR 3ML LUER SLIP (SYRINGE) IMPLANT
NDL HYPO 25X5/8 SAFETYGLIDE (NEEDLE) IMPLANT
NEEDLE HYPO 25X5/8 SAFETYGLIDE (NEEDLE) ×1 IMPLANT
NS IRRIG 1000ML POUR BTL (IV SOLUTION) ×1 IMPLANT
PACK C SECTION WH (CUSTOM PROCEDURE TRAY) ×1 IMPLANT
PAD OB MATERNITY 4.3X12.25 (PERSONAL CARE ITEMS) ×1 IMPLANT
RTRCTR C-SECT PINK 25CM LRG (MISCELLANEOUS) IMPLANT
STRIP CLOSURE SKIN 1/2X4 (GAUZE/BANDAGES/DRESSINGS) ×1 IMPLANT
SUT CHROMIC 0 CT 1 (SUTURE) ×1 IMPLANT
SUT MNCRL AB 3-0 PS2 27 (SUTURE) ×1 IMPLANT
SUT MON AB 2-0 CT1 27 (SUTURE) IMPLANT
SUT PLAIN 2 0 (SUTURE) ×2
SUT PLAIN 2 0 XLH (SUTURE) ×1 IMPLANT
SUT PLAIN ABS 2-0 CT1 27XMFL (SUTURE) ×2 IMPLANT
SUT SILK 2 0 SH (SUTURE) IMPLANT
SUT VIC AB 0 CTX 36 (SUTURE) ×4
SUT VIC AB 0 CTX36XBRD ANBCTRL (SUTURE) ×4 IMPLANT
SUT VIC AB 2-0 SH 27 (SUTURE)
SUT VIC AB 2-0 SH 27XBRD (SUTURE) IMPLANT
SYR 3ML 23GX1 SAFETY (SYRINGE) IMPLANT
TOWEL OR 17X24 6PK STRL BLUE (TOWEL DISPOSABLE) ×1 IMPLANT
TRAY FOLEY W/BAG SLVR 14FR LF (SET/KITS/TRAYS/PACK) ×1 IMPLANT
WATER STERILE IRR 1000ML POUR (IV SOLUTION) ×1 IMPLANT

## 2021-10-31 NOTE — MAU Note (Signed)
Robin Mora is a 31 y.o. at [redacted]w[redacted]d here in MAU reporting: rupture of membranes today at 0315. Pt reports clear fluid with normal odor. Pt denies VB. +FM. Pt had an appointment yesterday and she was closed.   Onset of complaint: 10/31/2021 0315 Pain score: Pt denies pain  Vitals:   10/31/21 0414  BP: (!) 134/99  Pulse: 81  Resp: 19  Temp: 98.8 F (37.1 C)     FHT:140 Lab orders placed from triage:  Maryann Alar

## 2021-10-31 NOTE — Anesthesia Postprocedure Evaluation (Signed)
Anesthesia Post Note  Patient: Robin Mora  Procedure(s) Performed: [place]     Patient location during evaluation: PACU Anesthesia Type: Epidural Level of consciousness: oriented and awake and alert Pain management: pain level controlled Vital Signs Assessment: post-procedure vital signs reviewed and stable Respiratory status: spontaneous breathing, respiratory function stable and nonlabored ventilation Cardiovascular status: blood pressure returned to baseline and stable Postop Assessment: no headache, no backache, no apparent nausea or vomiting and epidural receding Anesthetic complications: no   No notable events documented.  Last Vitals:  Vitals:   10/31/21 2206 10/31/21 2323  BP: (!) 145/84 (!) 140/67  Pulse: 70 66  Resp: 15   Temp: 36.7 C 36.7 C  SpO2: 98%     Last Pain:  Vitals:   10/31/21 2206  TempSrc: Oral  PainSc: 5    Pain Goal: Patients Stated Pain Goal: 0 (10/31/21 2130)                 Lidia Collum

## 2021-10-31 NOTE — Anesthesia Preprocedure Evaluation (Signed)
Anesthesia Evaluation  Patient identified by MRN, date of birth, ID band Patient awake    Reviewed: Allergy & Precautions, Patient's Chart, lab work & pertinent test results  Airway Mallampati: II  TM Distance: >3 FB Neck ROM: Full    Dental no notable dental hx.    Pulmonary neg pulmonary ROS,    Pulmonary exam normal breath sounds clear to auscultation       Cardiovascular negative cardio ROS Normal cardiovascular exam Rhythm:Regular Rate:Normal     Neuro/Psych PSYCHIATRIC DISORDERS Anxiety Depression negative neurological ROS     GI/Hepatic PUD, GERD  Controlled,(+)       marijuana use,   Endo/Other  Morbid obesityBMI 41  Renal/GU negative Renal ROS  negative genitourinary   Musculoskeletal negative musculoskeletal ROS (+)   Abdominal (+) + obese,   Peds negative pediatric ROS (+)  Hematology  (+) Blood dyscrasia, anemia , Hb 11.3, plt 357   Anesthesia Other Findings   Reproductive/Obstetrics (+) Pregnancy                             Anesthesia Physical Anesthesia Plan  ASA: 3  Anesthesia Plan: Epidural   Post-op Pain Management:    Induction:   PONV Risk Score and Plan: 2  Airway Management Planned: Natural Airway  Additional Equipment: None  Intra-op Plan:   Post-operative Plan:   Informed Consent: I have reviewed the patients History and Physical, chart, labs and discussed the procedure including the risks, benefits and alternatives for the proposed anesthesia with the patient or authorized representative who has indicated his/her understanding and acceptance.       Plan Discussed with:   Anesthesia Plan Comments:         Anesthesia Quick Evaluation

## 2021-10-31 NOTE — Transfer of Care (Signed)
Immediate Anesthesia Transfer of Care Note  Patient: Robin Mora  Procedure(s) Performed: CESAREAN SECTION  Patient Location: PACU  Anesthesia Type:Epidural  Level of Consciousness: awake, alert  and patient cooperative  Airway & Oxygen Therapy: Patient Spontanous Breathing  Post-op Assessment: Report given to RN and Post -op Vital signs reviewed and stable  Post vital signs: Reviewed and stable  Last Vitals:  Vitals Value Taken Time  BP 134/82 10/31/21 2036  Temp    Pulse 75 10/31/21 2042  Resp 15 10/31/21 2042  SpO2 99 % 10/31/21 2042  Vitals shown include unvalidated device data.  Last Pain:  Vitals:   10/31/21 1731  TempSrc: Oral  PainSc:          Complications: No notable events documented.

## 2021-10-31 NOTE — Progress Notes (Signed)
Patient ID: Robin Mora, female   DOB: 16-Dec-1990, 31 y.o.   MRN: [id number] Pt did get some pain relief with fentanyl BP 117/66   Pulse 74   Temp 98.2 F (36.8 C) (Oral)   Resp 18   Ht 5\' 4"  (1.626 m)   Wt 108.9 kg   LMP 07/20/2020 (Exact Date)   SpO2 100%   BMI 41.20 kg/m  Cat 1 Toco irreg 1/70/-3 Repeat cytotec 25 mcg orally Then will start pitocin in four hours Epidural PRN Anticipate SVD

## 2021-10-31 NOTE — H&P (Addendum)
Robin Mora is a 31 y.o. female presenting for Spontaneous rupture of membranes at 3.15 am this Am. Fern test was positive in MAU.  G4P0030 at 38 weeks 2 days EGA EDD 11/12/21 by first trimester ultrasound.  She denies contractions.  She denies any current ulcerative colitis flare symptoms.   Prenatal Course significant for: Ulcerative colitis and on Humira.  She had a flare up in pregnancy that required steroids use. Rubella Non immune- defer postpartum vaccine while on Humira. History of seizures in childhood, none in pregnancy.    OB History     Gravida  4   Para      Term      Preterm      AB  3   Living         SAB      IAB      Ectopic      Multiple      Live Births           Obstetric Comments  Procedure ab x3        Past Medical History:  Diagnosis Date   Anemia    Anxiety    Depression    Endometriosis    GERD (gastroesophageal reflux disease)    Ovarian cyst    Ulcerative colitis ([place])    UTI (urinary tract infection)    Past Surgical History:  Procedure Laterality Date   HEMORRHOID BANDING     THERAPEUTIC ABORTION     x3   Family History: family history includes Diabetes in her father, maternal grandmother, paternal grandfather, and paternal grandmother; Heart disease in her paternal grandfather; Hyperlipidemia in her father and mother; Hypertension in her father, paternal grandfather, and paternal grandmother; Kidney disease in her paternal grandfather; Sleep apnea in her father. Social History:  reports that she has never smoked. She has never used smokeless tobacco. She reports that she does not currently use alcohol. She reports current drug use. Frequency: 3.00 times per week. Drug: Marijuana.     Maternal Diabetes: No Genetic Screening: Normal Maternal Ultrasounds/Referrals: Normal Fetal Ultrasounds or other Referrals:  None Maternal Substance Abuse:  No Significant Maternal Medications:  Meds include: Other:   Humira Significant Maternal Lab Results:  Group B Strep negative Number of Prenatal Visits:greater than 3 verified prenatal visits Other Comments:  None  Review of Systems Constitutional: Denies fevers/chills Cardiovascular: Denies chest pain or palpitations Pulmonary: Denies coughing or wheezing Gastrointestinal: Denies nausea, vomiting or diarrhea Genitourinary: Denies pelvic pain, unusual vaginal bleeding, unusual vaginal discharge, dysuria, urgency or frequency.  Musculoskeletal: Denies muscle or joint aches and pain.  Neurology: Denies abnormal sensations such as tingling or numbness.   History   Blood pressure (!) 146/93, pulse 73, temperature 97.6 F (36.4 C), temperature source Oral, resp. rate 19, height 5\' 4"  (1.626 m), weight 108.9 kg, last menstrual period 07/20/2020, SpO2 100 %. Exam Physical Exam     10/31/2021    6:44 AM 10/31/2021    5:40 AM 10/31/2021    4:52 AM  Vitals with BMI  Systolic 259 563 875  Diastolic 79 93 91  Pulse 74 73 75    Cvx: Fingertip/thick/-3 per RN. No visible lesions on perineum.  EFM: 130 BL, moderate variability, reactive.No decelerations.  TOCO: No contractions.  Prenatal labs: ABO, Rh: --/--/PENDING (09/26 6433) Antibody: PENDING (09/26 0433) Rubella:  Non Immune RPR:   NR HBsAg:   Neg HIV:   Neg GBS:  Neg  CBC  Component Value Date/Time   WBC 6.8 10/31/2021 0535   RBC 4.29 10/31/2021 0535   HGB 11.3 (L) 10/31/2021 0535   HGB 12.3 09/17/2018 1123   HCT 35.2 (L) 10/31/2021 0535   HCT 37.7 09/17/2018 1123   PLT 357 10/31/2021 0535   PLT 374 09/17/2018 1123   MCV 82.1 10/31/2021 0535   MCV 83 09/17/2018 1123   MCH 26.3 10/31/2021 0535   MCHC 32.1 10/31/2021 0535   RDW 15.5 10/31/2021 0535   RDW 15.4 09/17/2018 1123   LYMPHSABS 0.4 (L) 10/25/2020 1706   MONOABS 0.7 10/25/2020 1706   EOSABS 0.1 10/25/2020 1706   BASOSABS 0.1 10/25/2020 1706      Assessment/Plan: 31 y/o G4P0030 at 38 weeks 2 days EGA with SROM, with  history of ulcerative colitis and no current symptoms or lesions, - Admit to Labor and delivery. - Admit orders as placed in. - Cervix was checked and found to be fingertip dilated.  Patient is without contractions. Oral cytotec for labor augmentation was offered and patient agreed to it.   - With mild range elevated blood pressures, check preeclampsia labs.  Prescilla Sours, MD.  10/31/2021, 6:27 AM

## 2021-10-31 NOTE — MAU Provider Note (Signed)
[id number] [place] Jun 23, 1990  Patient informed that the ultrasound is considered a limited OB ultrasound and is not intended to be a complete ultrasound exam.  Patient also informed that the ultrasound is not being completed with the intent of assessing for fetal or placental anomalies or any pelvic abnormalities.  Explained that the purpose of today's ultrasound is to assess for  presentation.  Patient acknowledges the purpose of the exam and the limitations of the study.  Cephalic presentation noted.   Maryann Conners, CNM 10/31/2021, 4:24 AM

## 2021-10-31 NOTE — Progress Notes (Signed)
Pitocin stopped.

## 2021-10-31 NOTE — Op Note (Signed)
Cesarean Section Procedure Note   Robin Mora  10/31/2021  Indications: Fetal Distress   Pre-operative Diagnosis: fetal bradycardia.   Post-operative Diagnosis: Same   Surgeon: Surgeon(s) and Role:    * Tessla Spurling, Gwynneth Munson, MD - Primary    * Ndulue, Lyndel Safe, MD - Assisting   Assistants: see above   Anesthesia: epidural   Procedure Details:  The patient was seen in the Holding Room. The risks, benefits, complications, treatment options, and expected outcomes were discussed with the patient. The patient concurred with the proposed plan, giving informed consent. identified as Robin Mora and the procedure verified as C-Section Delivery. A Time Out was held and the above information confirmed.  After induction of anesthesia, the patient was draped and prepped in the usual sterile manner. A transverse incision was made and carried down through the subcutaneous tissue to the fascia. Fascial incision was made in the midline and extended transversely. The fascia was separated from the underlying rectus muscle superiorly and inferiorly. The peritoneum was identified and entered. Peritoneal incision was extended longitudinally with good visualization of bowel and bladder. The utero-vesical peritoneal reflection was incised transversely and the bladder flap was bluntly freed from the lower uterine segment.  An alexsis retractor was placed in the abdomen.   A low transverse uterine incision was made. Delivered from cephalic presentation was a  infant, with Apgar scores of 5 at one minute and 8 at five minutes. Cord ph was sent the umbilical cord was clamped and cut cord blood was obtained for evaluation. The placenta was removed Intact and appeared normal. The uterine outline, tubes and ovaries appeared normal}. The uterine incision was closed with running locked sutures of 0Vicryl. A second layer 0 vicrlyl was used to imbricate the uterine incision .  Figure of eight suture was  placed in the left side of the uteurs to obtain hemostasis.     Hemostasis was observed. Lavage was carried out until clear. The alexsis was removed.  The peritoneum was closed with 0 chromic.  The muscles were examined and any bleeders were made hemostatic using bovie cautery device.   The fascia was then reapproximated with running sutures of 0 vicryl.  The subcutaneous tissue was reapproximated  With interrupted stitches using 2-0 plain gut. The subcuticular closure was performed using 3-27monocryl     Instrument, sponge, and needle counts were correct prior the abdominal closure and were correct at the conclusion of the case.    Findings: infant was delivered from vtx  presentation. The fluid was clear.  The uterus tubes and ovaries appeared normal.     Estimated Blood Loss: 364 cc   Total IV Fluids: 212ml   Urine Output:  1000CC OF clear urine  Specimens: to pathology  Complications: no complications  Disposition: PACU - hemodynamically stable.   Maternal Condition: stable   Baby condition / location:  Couplet care / Skin to Skin  Attending Attestation: I was present and scrubbed for the entire procedure.   The assistant was needed for the complexity of the surgery  Signed: Surgeon(s): Crawford Givens, MD Ndulue, Lyndel Safe, MD

## 2021-10-31 NOTE — Anesthesia Procedure Notes (Addendum)
Epidural Patient location during procedure: OB Start time: 10/31/2021 2:22 PM End time: 10/31/2021 2:30 PM  Staffing Anesthesiologist: Lidia Collum, MD Performed: anesthesiologist   Preanesthetic Checklist Completed: patient identified, IV checked, risks and benefits discussed, monitors and equipment checked, pre-op evaluation and timeout performed  Epidural Patient position: sitting Prep: DuraPrep Patient monitoring: heart rate, continuous pulse ox and blood pressure Approach: midline Location: L3-L4 Injection technique: LOR air  Needle:  Needle type: Tuohy  Needle gauge: 17 G Needle length: 9 cm Needle insertion depth: 9 cm Catheter type: closed end flexible Catheter size: 19 Gauge Catheter at skin depth: 14 cm  Assessment Events: blood not aspirated, injection not painful, no injection resistance, no paresthesia and negative IV test  Additional Notes Reason for block:procedure for pain

## 2021-11-01 ENCOUNTER — Encounter (HOSPITAL_COMMUNITY): Payer: Self-pay | Admitting: Obstetrics and Gynecology

## 2021-11-01 LAB — CBC
HCT: 31.1 % — ABNORMAL LOW (ref 36.0–46.0)
Hemoglobin: 10 g/dL — ABNORMAL LOW (ref 12.0–15.0)
MCH: 26.2 pg (ref 26.0–34.0)
MCHC: 32.2 g/dL (ref 30.0–36.0)
MCV: 81.6 fL (ref 80.0–100.0)
Platelets: 306 10*3/uL (ref 150–400)
RBC: 3.81 MIL/uL — ABNORMAL LOW (ref 3.87–5.11)
RDW: 14.8 % (ref 11.5–15.5)
WBC: 18.6 10*3/uL — ABNORMAL HIGH (ref 4.0–10.5)
nRBC: 0 % (ref 0.0–0.2)

## 2021-11-01 MED ORDER — NIFEDIPINE ER OSMOTIC RELEASE 30 MG PO TB24
30.0000 mg | ORAL_TABLET | Freq: Every day | ORAL | Status: DC
  Administered 2021-11-01 – 2021-11-03 (×3): 30 mg via ORAL
  Filled 2021-11-01 (×3): qty 1

## 2021-11-01 NOTE — Social Work (Signed)
CSW received consult for hx of Anxiety and Depression.  CSW met with MOB to offer support and complete assessment. CSW entered the room, introduced self, CSW role and reason for visit. MOB was agreeable to visit. CSW observed MOB  getting up from the bed and moving to the chair with the infant stating I need to sit up more. CSW inquired about how MOB was feeling, MOB reported she was feeling good overall but a little sore form the csection. CSW inquired about MOB's MH hx, MOB reported she was diagnosed in May of 2023 around that same time MOB began seeing her therapist Arcelia Jew through a virtual platform. MOB plans to continue seeing her therapist. CSW inquired about medication, MOB reported none. CSW assessed for safety, MOB denied any SI, HI or DV. MOB identified her supports as her sister and FOB. CSW provided education regarding the baby blues period vs. perinatal mood disorders, discussed treatment and gave resources for mental health follow up if concerns arise.  CSW recommends self-evaluation during the postpartum time period using the New Mom Checklist from Postpartum Progress and encouraged MOB to contact a medical professional if symptoms are noted at any time.    CSW provided review of Sudden Infant Death Syndrome (SIDS) precautions. MOB identified [facility] for infant follow up care. MOB reported she has a crib for the infant to sleep but needs a car seat. CSW informed MOB a car seat could be provided for $30. MOB stated ok, I can get $30. CSW delivered car seat and instructed MOB to give $30 to the secretary. MOB voiced understanding. CSW identifies no further need for intervention and no barriers to discharge at this time.   Letta Kocher, [place] Social Worker [phone]

## 2021-11-01 NOTE — Progress Notes (Signed)
Subjective: Postpartum Day 1: Cesarean Delivery Patient reports tolerating PO, + flatus, and no problems voiding.    Objective: Vital signs in last 24 hours: Temp:  [97.6 F (36.4 C)-98.7 F (37.1 C)] 98.7 F (37.1 C) (09/27 1040) Pulse Rate:  [56-83] 62 (09/27 1040) Resp:  [12-21] 18 (09/27 1040) BP: (113-147)/(56-100) 126/86 (09/27 1040) SpO2:  [98 %-99 %] 99 % (09/27 1040)  Physical Exam:  General: alert and no distress Lochia: appropriate Uterine Fundus: FF, NT Incision: dressing on with a minimal dried quarter size stain DVT Evaluation: no calf tenderness  Recent Labs    10/31/21 0535 11/01/21 0719  HGB 11.3* 10.0*  HCT 35.2* 31.1*    Assessment/Plan: Status post Cesarean section. Doing well postoperatively.  Continue current care. SCDs for DVT prophylaxis IS OOB ad lib Plan circumcision tomorrow.  Baby not feeding well. Breast feeding.  Baby not taking the similac. BP controlled on procardia xl 30mg .  Pt is asymptomatic.   Delice Lesch, MD 11/01/2021, 11:42 AM

## 2021-11-01 NOTE — Lactation Note (Signed)
This note was copied from a baby's chart. Lactation Consultation Note  Patient Name: Robin Mora VCBSW'H Date: 11/01/2021 Reason for consult: Initial assessment;Primapara;1st time breastfeeding;Early term 37-38.6wks;Infant < 6lbs;Difficult latch Age:31 hours  LC in to visit with P1 Mom and FOB of ET infant delivered by STAT C/S weighing 5 lbs 8 oz.  Baby has been consistently supplemented with formula by bottle. 5 feedings already.   Initiated the LBW feeding protocol and place crib card up with volumes recommended.  Offered to assist with breastfeeding.  Mom with large, heavy breasts.  Placed a rolled up cloth under left breast.  Both nipples are large, erect and areola is firm and more difficult to compress.  Mom states this happened during pregnancy.  Reviewed breast massage and hand expression.  Drop of colostrum noted.  Placed baby on left breast in football hold.  Baby cueing and latched with assisted with a shallow latch, baby sucking vigorously for a few minutes.  Talked about the importance of pumping and supplementing due to baby's small size and difficult latch to the breast.    LC set up DEBP and assisted with first pumping.  24 flanges.  Reviewed plan of feeding every 3 hrs or sooner if baby is cueing.   Encouraged supplementing per volume guidelines and pumping to support a full milk supply.  Encouraged STS with baby as much as possible.  Mom to call out prn for assistance.  RN updated.  Maternal Data Has patient been taught Hand Expression?: Yes Does the patient have breastfeeding experience prior to this delivery?: No   LATCH Score Latch: Repeated attempts needed to sustain latch, nipple held in mouth throughout feeding, stimulation needed to elicit sucking reflex.  Audible Swallowing: A few with stimulation  Type of Nipple: Everted at rest and after stimulation (wide based nipple and firm areola)  Comfort (Breast/Nipple): Soft / non-tender  Hold  (Positioning): Assistance needed to correctly position infant at breast and maintain latch.  LATCH Score: 7   Lactation Tools Discussed/Used Tools: Pump;Flanges;Bottle Flange Size: 24 Breast pump type: Double-Electric Breast Pump Pump Education: Setup, frequency, and cleaning;Milk Storage Reason for Pumping: support milk supply/< 6 lb baby/difficult latch to breast Pumping frequency: Encouraged to pump every 3 hrs  Interventions Interventions: Breast feeding basics reviewed;Assisted with latch;Skin to skin;Breast massage;Hand express;Pre-pump if needed;Breast compression;Adjust position;Support pillows;Position options;DEBP;Hand pump;Pace feeding  LC service brochure  Discharge Pump: Personal (medela PIS)  Consult Status Consult Status: Follow-up Date: 11/02/21 Follow-up type: In-patient    Robin Mora 11/01/2021, 9:04 AM

## 2021-11-02 LAB — SURGICAL PATHOLOGY

## 2021-11-02 MED ORDER — TETANUS-DIPHTH-ACELL PERTUSSIS 5-2.5-18.5 LF-MCG/0.5 IM SUSY: 0.5000 mL | PREFILLED_SYRINGE | Freq: Once | INTRAMUSCULAR | Status: DC

## 2021-11-02 NOTE — Social Work (Signed)
CSW acknowledged consult, CSW met with MOB and addressed MH concerns yesterday. No new concerns  Letta Kocher, [place] Social Worker [phone]

## 2021-11-02 NOTE — Progress Notes (Signed)
Subjective: Postpartum Day 2: Cesarean Delivery Patient reports incisional pain, tolerating PO, and no problems voiding.   Incisional pain improves with pain medication use.  She is ambulating and voiding without difficulty.  She desires circumcision of her baby.   Objective: Vital signs in last 24 hours: Temp:  [98.4 F (36.9 C)-98.7 F (37.1 C)] 98.5 F (36.9 C) (09/28 0447) Pulse Rate:  [68-72] 70 (09/28 0447) Resp:  [16-18] 16 (09/28 0447) BP: (119-127)/(60-72) 122/72 (09/28 0447) SpO2:  [99 %] 99 % (09/27 1601)    11/02/2021    4:47 AM 11/01/2021   10:35 PM 11/01/2021    4:01 PM  Vitals with BMI  Systolic 017 494 496  Diastolic 72 60 71  Pulse 70 72 68    Physical Exam:  General: alert, cooperative, and no distress Lochia: appropriate Uterine Fundus: firm Incision: With honey comb dressing. Small 2 cm diameter section with dried blood on left edge of incision DVT Evaluation: No evidence of DVT seen on physical exam. Calf/Ankle edema is present.  Recent Labs    10/31/21 0535 11/01/21 0719  HGB 11.3* 10.0*  HCT 35.2* 31.1*   Assessment/Plan: 31 y/o P1 POD # 2 Status post Cesarean section. Doing well postoperatively.  With gestational HTN and using Procardia for blood pressure control,  Continue current care. Pain medication as needed.   Archie Endo, MD 11/02/2021, 11:41 AM

## 2021-11-02 NOTE — Lactation Note (Signed)
This note was copied from a baby's chart. Lactation Consultation Note  Patient Name: Robin Mora [id number] Date: 31/28/2023 Reason for consult: Follow-up assessment;1st time breastfeeding;Difficult latch;Infant < 6lbs;Early term 37-38.6wks Age:31 hours  LC in to visit with P1 Mom of ET infant <6lbs and at 2% weight loss.   Baby unable to sustain a deep latch to the breast, Mom has been supplementing with formula by paced bottle.    Pumped only one time, colostrum collected in bottles, unfortunately this wasn't fed to baby.  Talked with Mom about the importance of feeding baby her EBM first before formula.  LC disassembled the pump parts, washed, rinsed and dried parts and reassembled and placed on pump for Mom to use.   Baby fussy and only quieted when placed in football hold on left breast.  Baby tried to latch, but unable to physically open his mouth wide enough to latch and sustain the latch.  Reassured Mom and encouraged consistent pumping.  Mom reports she is feeling much better today.    Plan recommended- 1-Keep baby STS as much as possible 2-Every 3 hrs or sooner if cueing, breastfeed or attempt to  3-Supplement with EBM+/formula per volume guidelines by paced bottle 4- Pump both breast 15 mins on initiation setting.  Mom pace bottle feeding when Sedan left the room.  Baby lying on his side and feeding well.  Encouraged Mom to ask for help prn.  LATCH Score Latch: Repeated attempts needed to sustain latch, nipple held in mouth throughout feeding, stimulation needed to elicit sucking reflex.  Audible Swallowing: None  Type of Nipple: Everted at rest and after stimulation  Comfort (Breast/Nipple): Soft / non-tender (firm areola, wide nipple bases)  Hold (Positioning): Full assist, staff holds infant at breast  LATCH Score: 5   Lactation Tools Discussed/Used Tools: Pump;Flanges;Bottle Flange Size: 24 Breast pump type: Double-Electric Breast Pump;Manual Pump Education:  Setup, frequency, and cleaning;Milk Storage Reason for Pumping: support milk supply/difficult latch/<6lbs Pumping frequency: Mom has pumped once, encouraged more consistent pumping to support milk supply Pumped volume: 2 mL  Interventions Interventions: Breast feeding basics reviewed;Assisted with latch;Skin to skin;Breast massage;Hand express;Adjust position;Support pillows;Position options;Hand pump;DEBP;Education;Pace feeding  Discharge Pump: Personal (Medela Pump in Style)  Consult Status Consult Status: Follow-up Date: 11/03/21 Follow-up type: In-patient    [name] 11/02/2021, 3:14 PM

## 2021-11-03 ENCOUNTER — Encounter (HOSPITAL_COMMUNITY): Payer: Self-pay | Admitting: Obstetrics & Gynecology

## 2021-11-03 DIAGNOSIS — Z8759 Personal history of other complications of pregnancy, childbirth and the puerperium: Secondary | ICD-10-CM

## 2021-11-03 DIAGNOSIS — O133 Gestational [pregnancy-induced] hypertension without significant proteinuria, third trimester: Secondary | ICD-10-CM

## 2021-11-03 MED ORDER — NIFEDIPINE ER 30 MG PO TB24: 30.0000 mg | ORAL_TABLET | Freq: Every day | ORAL | 1 refills | Status: DC

## 2021-11-03 MED ORDER — ACETAMINOPHEN 325 MG PO TABS: 650.0000 mg | ORAL_TABLET | Freq: Four times a day (QID) | ORAL | Status: DC | PRN

## 2021-11-03 MED ORDER — IBUPROFEN 600 MG PO TABS: 600.0000 mg | ORAL_TABLET | Freq: Four times a day (QID) | ORAL | 0 refills | Status: DC | PRN

## 2021-11-03 MED ORDER — OXYCODONE HCL 5 MG PO TABS: 5.0000 mg | ORAL_TABLET | Freq: Four times a day (QID) | ORAL | 0 refills | Status: AC | PRN

## 2021-11-03 NOTE — Discharge Summary (Signed)
Postpartum Discharge Summary  Date of Service updated 11/03/21    Patient Name: Robin Mora DOB: 1990-04-20 MRN: [id number]  Date of admission: 10/31/2021 Delivery date:10/31/2021  Delivering provider: Crawford Givens  Date of discharge: 11/03/2021  Admitting diagnosis: Normal labor [O80, Z37.9] Intrauterine pregnancy: [redacted]w[redacted]d    Secondary diagnosis:  Principal Problem:   Normal labor  Additional problems:  Patient Active Problem List   Diagnosis Date Noted   Gestational hypertension w/o significant proteinuria in 3rd trimester 11/03/2021   Postpartum care following cesarean delivery 11/03/2021   Normal labor 10/31/2021   Ulcerative colitis ([facility] 06/30/2016   Anemia 06/30/2016   Ulcerative colitis, acute, with rectal bleeding ([facility] 06/30/2016   Obesity, unspecified 05/07/2013       Discharge diagnosis: Term Pregnancy Delivered and Gestational Hypertension                                              Post partum procedures:none Augmentation: N/A Complications: Fetal bradycardia remote from delivery  Hospital course: Induction of Labor With Cesarean Section   31y.o. yo [id number] admitted to the hospital 10/31/2021 in latent labor with spontaneous rupture of membranes. Patient had a labor course significant for non-reassuring fetal heart rate. The patient went for cesarean section due to Non-Reassuring FHR. Delivery details are as follows: Membrane Rupture Time/Date: 3:15 AM ,10/31/2021   Delivery Method:C-Section, Low Transverse  Details of operation can be found in separate operative Note.  Patient had an uncomplicated postpartum course. She is ambulating, tolerating a regular diet, passing flatus, and urinating well.  Patient is discharged home in stable condition on 11/03/21.      Newborn Data: Birth date:10/31/2021  Birth time:7:36 PM  Gender:Female  Living status:Living  Apgars:5 ,8  Weight:2520 g                                Magnesium Sulfate  received: No BMZ received: No Rhophylac:N/A MMR:N/A Transfusion:No  Physical exam  Vitals:   11/02/21 1500 11/02/21 2151 11/03/21 0457 11/03/21 1015  BP: 129/85 127/85 130/73 135/88  Pulse: 72 80 86 94  Resp: 18 17 16    Temp: 97.8 F (36.6 C) 98.1 F (36.7 C) 98.2 F (36.8 C)   TempSrc: Oral Oral Oral   SpO2:      Weight:      Height:       General: alert, cooperative, and no distress Lochia: appropriate Uterine Fundus: firm Incision: Dressing is clean, dry, and intact DVT Evaluation: No evidence of DVT seen on physical exam. No cords or calf tenderness. No significant calf/ankle edema. Labs: Lab Results  Component Value Date   WBC 18.6 (H) 11/01/2021   HGB 10.0 (L) 11/01/2021   HCT 31.1 (L) 11/01/2021   MCV 81.6 11/01/2021   PLT 306 11/01/2021      Latest Ref Rng & Units 10/31/2021    5:35 AM  CMP  Glucose 70 - 99 mg/dL 70   BUN 6 - 20 mg/dL 7   Creatinine 0.44 - 1.00 mg/dL 0.68   Sodium 135 - 145 mmol/L 134   Potassium 3.5 - 5.1 mmol/L 3.9   Chloride 98 - 111 mmol/L 109   CO2 22 - 32 mmol/L 20   Calcium 8.9 - 10.3 mg/dL 8.4  Total Protein 6.5 - 8.1 g/dL 6.7   Total Bilirubin 0.3 - 1.2 mg/dL 0.5   Alkaline Phos 38 - 126 U/L 135   AST 15 - 41 U/L 24   ALT 0 - 44 U/L 12    Edinburgh Score:    11/02/2021   12:34 PM  Edinburgh Postnatal Depression Scale Screening Tool  I have been able to laugh and see the funny side of things. 1  I have looked forward with enjoyment to things. 1  I have blamed myself unnecessarily when things went wrong. 1  I have been anxious or worried for no good reason. 2  I have felt scared or panicky for no good reason. 2  Things have been getting on top of me. 2  I have been so unhappy that I have had difficulty sleeping. 2  I have felt sad or miserable. 0  I have been so unhappy that I have been crying. 1  The thought of harming myself has occurred to me. 0  Edinburgh Postnatal Depression Scale Total 12      After visit  meds:  Allergies as of 11/03/2021   No Known Allergies      Medication List     STOP taking these medications    dicyclomine 10 MG capsule Commonly known as: BENTYL       TAKE these medications    acetaminophen 325 MG tablet Commonly known as: TYLENOL Take 650 mg by mouth every 6 (six) hours as needed for mild pain. What changed: Another medication with the same name was added. Make sure you understand how and when to take each.   acetaminophen 325 MG tablet Commonly known as: TYLENOL Take 2 tablets (650 mg total) by mouth every 6 (six) hours as needed for mild pain (temperature > 101.5.). What changed: You were already taking a medication with the same name, and this prescription was added. Make sure you understand how and when to take each.   diphenhydramine-acetaminophen 25-500 MG Tabs tablet Commonly known as: TYLENOL PM Take 1 tablet by mouth at bedtime as needed.   ferrous sulfate 325 (65 FE) MG tablet ferrous sulfate 325 mg (65 mg iron) tablet  TAKE 1 TABLET BY MOUTH EVERY DAY   Humira 40 MG/0.4ML Pskt Generic drug: Adalimumab Inject 40 mg into the skin once a week. Every other week   hydrocortisone 2.5 % cream Procto-Med HC 2.5 % topical cream perineal applicator  APPLY 1 APPLICATION TO AFFECTED AREA TWO TIMES DAILY   ibuprofen 600 MG tablet Commonly known as: ADVIL Take 1 tablet (600 mg total) by mouth every 6 (six) hours as needed for moderate pain.   NIFEdipine 30 MG 24 hr tablet Commonly known as: ADALAT CC Take 1 tablet (30 mg total) by mouth daily. Start taking on: November 04, 2021   oxyCODONE 5 MG immediate release tablet Commonly known as: Oxy IR/ROXICODONE Take 1 tablet (5 mg total) by mouth every 6 (six) hours as needed for up to 3 days for breakthrough pain or severe pain.   PRENATAL VITAMIN PO Take 1 tablet by mouth daily.         Discharge home in stable condition Infant Feeding: Bottle and Breast Infant Disposition:home with  mother Discharge instruction: per After Visit Summary and Postpartum booklet. Activity: Advance as tolerated. Pelvic rest for 6 weeks.  Diet: low salt diet Anticipated Birth Control: Unsure Postpartum Appointment:1 week Additional Postpartum F/U: BP check 1 week Future Appointments:No future appointments. Follow up Visit:  [facility] Follow up in 1 week(s).   Specialty: Obstetrics and Gynecology Why: Return to [facility] in 1 week for a blood pressure check. Make an appointment with Dr. Charlesetta Garibaldi for your 6-week postpartum visit. Contact information: [address] [place] [phone]                    11/03/2021 Arrie Eastern, CNM

## 2021-11-03 NOTE — Lactation Note (Signed)
This note was copied from a baby's chart. Lactation Consultation Note  Patient Name: Robin Mora ITGPQ'D Date: 11/03/2021 Reason for consult: Follow-up assessment Age:31 hours   P1: Early term infant at 38+2 weeks Feeding preference: Breast/formula Weight loss: 2%  Reviewed breast feeding basics.  Baby was asleep at the breast when I arrived.  Assisted to awaken and attempt latching, however, he was not interested.  He had recently consumed formula.  Birth parent will continue to latch prior to giving formula supplementation.  Baby has been consuming adequate volumes; voiding/stooling well.  Discussed how birth parent can obtain an op lactation consult if desired.  Provided phone number for any general questions after discharge.   Maternal Data    Feeding    LATCH Score                    Lactation Tools Discussed/Used    Interventions    Discharge Discharge Education: Engorgement and breast care Pump: Personal  Consult Status Consult Status: Complete Date: 11/03/21 Follow-up type: Call as needed    Tamsen Reist R Daunte Oestreich 11/03/2021, 12:52 PM

## 2021-11-08 ENCOUNTER — Telehealth (HOSPITAL_COMMUNITY): Payer: Self-pay | Admitting: *Deleted

## 2021-11-08 NOTE — Telephone Encounter (Signed)
Mom reports feeling good. Incision healing well per mom. No concerns regarding herself at this time. EPDS=6 (hospital score=12) Mom reports baby is well. Feeding, peeing, and pooping without difficulty. Reviewed safe sleep. Mom has no concerns about baby at present.  Odis Hollingshead, RN 11-08-2021 at 2:04pm

## 2022-04-01 ENCOUNTER — Telehealth: Payer: BC Managed Care – PPO | Admitting: Physician Assistant

## 2022-04-01 DIAGNOSIS — R051 Acute cough: Secondary | ICD-10-CM | POA: Diagnosis not present

## 2022-04-01 MED ORDER — BENZONATATE 100 MG PO CAPS: 100.0000 mg | ORAL_CAPSULE | Freq: Three times a day (TID) | ORAL | 0 refills | Status: DC | PRN

## 2022-04-01 NOTE — Patient Instructions (Signed)
Murvin Natal, thank you for joining [place], PA-C for today's virtual visit.  While this provider is not your primary care provider (PCP), if your PCP is located in our provider database this encounter information will be shared with them immediately following your visit.   [facility] account gives you access to today's visit and all your visits, tests, and labs performed at [facility] " click here if you don't have a [facility] account or go to [email or web address]  Consent: (Patient) Robin Mora provided verbal consent for this virtual visit at the beginning of the encounter.  Current Medications:  Current Outpatient Medications:    benzonatate (TESSALON) 100 MG capsule, Take 1 capsule (100 mg total) by mouth 3 (three) times daily as needed., Disp: 20 capsule, Rfl: 0   acetaminophen (TYLENOL) 325 MG tablet, Take 650 mg by mouth every 6 (six) hours as needed for mild pain., Disp: , Rfl:    acetaminophen (TYLENOL) 325 MG tablet, Take 2 tablets (650 mg total) by mouth every 6 (six) hours as needed for mild pain (temperature > 101.5.)., Disp: , Rfl:    Adalimumab (HUMIRA) 40 MG/0.4ML PSKT, Inject 40 mg into the skin once a week. Every other week, Disp: , Rfl:    diphenhydramine-acetaminophen (TYLENOL PM) 25-500 MG TABS tablet, Take 1 tablet by mouth at bedtime as needed., Disp: , Rfl:    ferrous sulfate 325 (65 FE) MG tablet, ferrous sulfate 325 mg (65 mg iron) tablet  TAKE 1 TABLET BY MOUTH EVERY DAY, Disp: , Rfl:    hydrocortisone 2.5 % cream, Procto-Med HC 2.5 % topical cream perineal applicator  APPLY 1 APPLICATION TO AFFECTED AREA TWO TIMES DAILY, Disp: , Rfl:    ibuprofen (ADVIL) 600 MG tablet, Take 1 tablet (600 mg total) by mouth every 6 (six) hours as needed for moderate pain., Disp: 30 tablet, Rfl: 0   NIFEdipine (ADALAT CC) 30 MG 24 hr tablet, Take 1 tablet (30 mg total) by mouth daily., Disp: 30 tablet, Rfl: 1    Prenatal Vit-Fe Fumarate-FA (PRENATAL VITAMIN PO), Take 1 tablet by mouth daily., Disp: , Rfl:    Medications ordered in this encounter:  Meds ordered this encounter  Medications   benzonatate (TESSALON) 100 MG capsule    Sig: Take 1 capsule (100 mg total) by mouth 3 (three) times daily as needed.    Dispense:  20 capsule    Refill:  0    Order Specific Question:   Supervising Provider    Answer:   Chase Picket [id number]     *If you need refills on other medications prior to your next appointment, please contact your pharmacy*  Follow-Up: Call back or seek an in-person evaluation if the symptoms worsen or if the condition fails to improve as anticipated.  [place] [phone]  Other Instructions Take tessalon as needed for cough. Drink plenty of fluids. Recommend flonase and Mucinex.  Follow up for in person evaluation if symptoms become worse.    If you have been instructed to have an in-person evaluation today at a local Urgent Care facility, please use the link below. It will take you to a list of all of our available [facility], including address, phone number and hours of operation. Please do not delay care.  [facility]  If you or a family member do not have a primary care provider, use the link below to schedule  a visit and establish care. When you choose a [facility] primary care physician or advanced practice provider, you gain a long-term partner in health. Find a Primary Care Provider  Learn more about [facility]'s in-office and virtual care options: [facility]

## 2022-04-01 NOTE — Progress Notes (Signed)
Virtual Visit Consent   Robin Mora, you are scheduled for a virtual visit with a [facility] provider today. Just as with appointments in the office, your consent must be obtained to participate. Your consent will be active for this visit and any virtual visit you may have with one of our providers in the next 365 days. If you have a MyChart account, a copy of this consent can be sent to you electronically.  As this is a virtual visit, video technology does not allow for your provider to perform a traditional examination. This may limit your provider's ability to fully assess your condition. If your provider identifies any concerns that need to be evaluated in person or the need to arrange testing (such as labs, EKG, etc.), we will make arrangements to do so. Although advances in technology are sophisticated, we cannot ensure that it will always work on either your end or our end. If the connection with a video visit is poor, the visit may have to be switched to a telephone visit. With either a video or telephone visit, we are not always able to ensure that we have a secure connection.  By engaging in this virtual visit, you consent to the provision of healthcare and authorize for your insurance to be billed (if applicable) for the services provided during this visit. Depending on your insurance coverage, you may receive a charge related to this service.  I need to obtain your verbal consent now. Are you willing to proceed with your visit today? Robin Mora has provided verbal consent on 04/01/2022 for a virtual visit (video or telephone). Lenise Arena Ward, PA-C  Date: 04/01/2022 6:37 PM  Virtual Visit via Video Note   I, Lenise Arena Ward, connected with  Robin Mora  ([id number], 08-23-1990) on 04/01/22 at  6:30 PM EST by a video-enabled telemedicine application and verified that I am speaking with the correct person using two identifiers.  Location: Patient:  Virtual Visit Location Patient: Home Provider: Virtual Visit Location Provider: Home Office   I discussed the limitations of evaluation and management by telemedicine and the availability of in person appointments. The patient expressed understanding and agreed to proceed.    History of Present Illness: Robin Mora is a 32 y.o. who identifies as a female who was assigned female at birth, and is being seen today for congestion and cough that started about one week ago. She reports three days ago she was experiencing body aches and fatigue.  She has had positive exposure to the flu.  She reports body aches have improved. Denies fever, shortness of breath, wheezing, nausea, vomiting.  She has been taking allergy med and robitussin with minimal relief.    HPI: HPI  Problems:  Patient Active Problem List   Diagnosis Date Noted   Gestational hypertension w/o significant proteinuria in 3rd trimester 11/03/2021   Postpartum care following cesarean delivery 11/03/2021   Normal labor 10/31/2021   Ulcerative colitis ([facility]) 06/30/2016   Anemia 06/30/2016   Ulcerative colitis, acute, with rectal bleeding ([place] [facility]) 06/30/2016   Obesity, unspecified 05/07/2013    Allergies: No Known Allergies Medications:  Current Outpatient Medications:    benzonatate (TESSALON) 100 MG capsule, Take 1 capsule (100 mg total) by mouth 3 (three) times daily as needed., Disp: 20 capsule, Rfl: 0   acetaminophen (TYLENOL) 325 MG tablet, Take 650 mg by mouth every 6 (six) hours as needed for mild pain., Disp: , Rfl:  acetaminophen (TYLENOL) 325 MG tablet, Take 2 tablets (650 mg total) by mouth every 6 (six) hours as needed for mild pain (temperature > 101.5.)., Disp: , Rfl:    Adalimumab (HUMIRA) 40 MG/0.4ML PSKT, Inject 40 mg into the skin once a week. Every other week, Disp: , Rfl:    diphenhydramine-acetaminophen (TYLENOL PM) 25-500 MG TABS tablet, Take 1 tablet by mouth at bedtime as needed., Disp: , Rfl:     ferrous sulfate 325 (65 FE) MG tablet, ferrous sulfate 325 mg (65 mg iron) tablet  TAKE 1 TABLET BY MOUTH EVERY DAY, Disp: , Rfl:    hydrocortisone 2.5 % cream, Procto-Med HC 2.5 % topical cream perineal applicator  APPLY 1 APPLICATION TO AFFECTED AREA TWO TIMES DAILY, Disp: , Rfl:    ibuprofen (ADVIL) 600 MG tablet, Take 1 tablet (600 mg total) by mouth every 6 (six) hours as needed for moderate pain., Disp: 30 tablet, Rfl: 0   NIFEdipine (ADALAT CC) 30 MG 24 hr tablet, Take 1 tablet (30 mg total) by mouth daily., Disp: 30 tablet, Rfl: 1   Prenatal Vit-Fe Fumarate-FA (PRENATAL VITAMIN PO), Take 1 tablet by mouth daily., Disp: , Rfl:   Observations/Objective: Patient is well-developed, well-nourished in no acute distress.  Resting comfortably at home.  Head is normocephalic, atraumatic.  No labored breathing.  Speech is clear and coherent with logical content.  Patient is alert and oriented at baseline.    Assessment and Plan: 1. Acute cough - benzonatate (TESSALON) 100 MG capsule; Take 1 capsule (100 mg total) by mouth 3 (three) times daily as needed.  Dispense: 20 capsule; Refill: 0    Follow Up Instructions: I discussed the assessment and treatment plan with the patient. The patient was provided an opportunity to ask questions and all were answered. The patient agreed with the plan and demonstrated an understanding of the instructions.  A copy of instructions were sent to the patient via MyChart unless otherwise noted below.     The patient was advised to call back or seek an in-person evaluation if the symptoms worsen or if the condition fails to improve as anticipated.  Time:  I spent 5 minutes with the patient via telehealth technology discussing the above problems/concerns.    Lenise Arena Ward, PA-C

## 2022-08-14 ENCOUNTER — Inpatient Hospital Stay (EMERGENCY_DEPARTMENT_HOSPITAL)
Admission: AD | Admit: 2022-08-14 | Discharge: 2022-08-14 | Disposition: A | Discharge status: Home / Self Care | Payer: BC Managed Care – PPO | Source: Home / Self Care | Attending: Obstetrics and Gynecology | Admitting: Obstetrics and Gynecology

## 2022-08-14 ENCOUNTER — Inpatient Hospital Stay (HOSPITAL_COMMUNITY): Payer: BC Managed Care – PPO

## 2022-08-14 ENCOUNTER — Encounter (HOSPITAL_COMMUNITY): Payer: Self-pay | Admitting: Obstetrics and Gynecology

## 2022-08-14 ENCOUNTER — Inpatient Hospital Stay (HOSPITAL_COMMUNITY)
Admission: AD | Admit: 2022-08-14 | Discharge: 2022-08-14 | Discharge status: Against Medical Advice | Payer: BC Managed Care – PPO | Attending: Obstetrics & Gynecology | Admitting: Obstetrics & Gynecology

## 2022-08-14 ENCOUNTER — Encounter (HOSPITAL_COMMUNITY): Payer: Self-pay | Admitting: Obstetrics & Gynecology

## 2022-08-14 ENCOUNTER — Other Ambulatory Visit: Payer: Self-pay

## 2022-08-14 DIAGNOSIS — O99011 Anemia complicating pregnancy, first trimester: Secondary | ICD-10-CM | POA: Diagnosis not present

## 2022-08-14 DIAGNOSIS — O209 Hemorrhage in early pregnancy, unspecified: Secondary | ICD-10-CM | POA: Insufficient documentation

## 2022-08-14 DIAGNOSIS — O26891 Other specified pregnancy related conditions, first trimester: Secondary | ICD-10-CM | POA: Insufficient documentation

## 2022-08-14 DIAGNOSIS — O4691 Antepartum hemorrhage, unspecified, first trimester: Secondary | ICD-10-CM | POA: Diagnosis present

## 2022-08-14 DIAGNOSIS — Z3491 Encounter for supervision of normal pregnancy, unspecified, first trimester: Secondary | ICD-10-CM

## 2022-08-14 DIAGNOSIS — O219 Vomiting of pregnancy, unspecified: Secondary | ICD-10-CM | POA: Insufficient documentation

## 2022-08-14 DIAGNOSIS — Z3A01 Less than 8 weeks gestation of pregnancy: Secondary | ICD-10-CM

## 2022-08-14 DIAGNOSIS — O34219 Maternal care for unspecified type scar from previous cesarean delivery: Secondary | ICD-10-CM

## 2022-08-14 DIAGNOSIS — O09291 Supervision of pregnancy with other poor reproductive or obstetric history, first trimester: Secondary | ICD-10-CM | POA: Insufficient documentation

## 2022-08-14 DIAGNOSIS — Z3A09 9 weeks gestation of pregnancy: Secondary | ICD-10-CM | POA: Diagnosis not present

## 2022-08-14 DIAGNOSIS — O99611 Diseases of the digestive system complicating pregnancy, first trimester: Secondary | ICD-10-CM | POA: Diagnosis not present

## 2022-08-14 DIAGNOSIS — O99211 Obesity complicating pregnancy, first trimester: Secondary | ICD-10-CM | POA: Insufficient documentation

## 2022-08-14 DIAGNOSIS — K5190 Ulcerative colitis, unspecified, without complications: Secondary | ICD-10-CM | POA: Diagnosis not present

## 2022-08-14 LAB — URINALYSIS, ROUTINE W REFLEX MICROSCOPIC
Bilirubin Urine: NEGATIVE
Glucose, UA: NEGATIVE mg/dL
Ketones, ur: 20 mg/dL — AB
Leukocytes,Ua: NEGATIVE
Nitrite: NEGATIVE
Protein, ur: 30 mg/dL — AB
RBC / HPF: >50 RBC/hpf (ref 0–5)
Specific Gravity, Urine: 1.023 (ref 1.005–1.030)
pH: 5 (ref 5.0–8.0)

## 2022-08-14 LAB — WET PREP, GENITAL
Clue Cells Wet Prep HPF POC: NONE SEEN
Sperm: NONE SEEN
Trich, Wet Prep: NONE SEEN
WBC, Wet Prep HPF POC: <10 (ref <10)
Yeast Wet Prep HPF POC: NONE SEEN

## 2022-08-14 LAB — HCG, QUANTITATIVE, PREGNANCY: hCG, Beta Chain, Quant, S: 24984 m[IU]/mL — ABNORMAL HIGH (ref <5)

## 2022-08-14 LAB — POCT PREGNANCY, URINE: Preg Test, Ur: POSITIVE — AB

## 2022-08-14 MED ORDER — DOXYLAMINE-PYRIDOXINE 10-10 MG PO TBEC: 2.0000 | DELAYED_RELEASE_TABLET | Freq: Every evening | ORAL | 3 refills | Status: AC

## 2022-08-14 NOTE — MAU Note (Signed)
Robin Mora is a 32 y.o. at Unknown here in MAU reporting: she's not taking a pregnancy test but hasn't had a cycle since May.  Reports had a small spot of blood in underwear and small amt with wiping. LMP: 06/17/2022 Onset of complaint: today Pain score: 0 Vitals:   08/14/22 0950  BP: 120/72  Pulse: 69  Resp: 18  Temp: 98 F (36.7 C)  SpO2: 100%     FHT:NA Lab orders placed from triage:   UPT

## 2022-08-14 NOTE — MAU Note (Addendum)
.  Robin Mora is a 32 y.o. at [redacted]w[redacted]d here in MAU reporting heavier vag bleeding. She was here earlier today but left before evaluation was complete due to a lot of people in MAU. Pt states her bleeding has gotten heavier more like a period. PT states her ulcerative colitis is acting up and now she is getting anxious. She took her ADHD med atomoxenine and feels that may have caused the bleeding to start. Took Tylenol 500mg  x 2 at 2200. Having some cramping in lower abd  Onset of complaint: This am Pain score: 7 Vitals:   08/14/22 2246  Pulse: (!) 58  Resp: 18  Temp: 97.6 F (36.4 C)  SpO2: 100%     FHT:n/a Lab orders placed from triage:  none

## 2022-08-14 NOTE — MAU Provider Note (Signed)
History     CSN: [id number]  Arrival date and time: 08/14/22 4098   Event Date/Time   First Provider Initiated Contact with Patient 08/14/22 1135      Chief Complaint  Patient presents with   Vaginal Bleeding   Vaginal Bleeding Pertinent negatives include no abdominal pain, back pain, chills, diarrhea, dysuria, fever, flank pain, nausea, rash, sore throat or vomiting.   Robin Mora is a 32 y.o. G2P1001 [redacted]w[redacted]d here with complaints of vaginal bleeding in early pregnancy. OB care provider for prior pregnancy was [facility]. She reports onset of the bleeding on 08/14/22 with one spot of blood. She reports LMP in May. She has history of ulcerative colitis.   Reports VB. Denies abdominal pain. Denies vaginal discharge.    OB History     Gravida  5   Para  1   Term  1   Preterm      AB  3   Living  1      SAB      IAB      Ectopic      Multiple  0   Live Births  1        Obstetric Comments  Procedure ab x3         Past Medical History:  Diagnosis Date   Anemia    Anxiety    Depression    Endometriosis    GERD (gastroesophageal reflux disease)    Large breasts 05/07/2013   Ovarian cyst    Ulcerative colitis (HCC)    UTI (urinary tract infection)     Past Surgical History:  Procedure Laterality Date   CESAREAN SECTION N/A 10/31/2021   Procedure: CESAREAN SECTION;  Surgeon: Jaymes Graff, MD;  Location: [facility];  Service: Obstetrics;  Laterality: N/A;   HEMORRHOID BANDING     THERAPEUTIC ABORTION     x3    Family History  Problem Relation Age of Onset   Hyperlipidemia Mother    Diabetes Father    Hyperlipidemia Father    Hypertension Father    Sleep apnea Father    Diabetes Maternal Grandmother    Diabetes Paternal Grandmother    Hypertension Paternal Grandmother    Heart disease Paternal Grandfather    Hypertension Paternal Grandfather    Kidney disease Paternal Grandfather    Diabetes Paternal Grandfather     Social History    Tobacco Use   Smoking status: Never   Smokeless tobacco: Never  Vaping Use   Vaping Use: Never used  Substance Use Topics   Alcohol use: Not Currently    Comment: occ   Drug use: Yes    Frequency: 3.0 times per week    Types: Marijuana    Comment: pt states the last time was 2-3 weeks ago    Allergies: No Known Allergies  Medications Prior to Admission  Medication Sig Dispense Refill Last Dose   acetaminophen (TYLENOL) 325 MG tablet Take 650 mg by mouth every 6 (six) hours as needed for mild pain.      acetaminophen (TYLENOL) 325 MG tablet Take 2 tablets (650 mg total) by mouth every 6 (six) hours as needed for mild pain (temperature > 101.5.).      Adalimumab (HUMIRA) 40 MG/0.4ML PSKT Inject 40 mg into the skin once a week. Every other week      benzonatate (TESSALON) 100 MG capsule Take 1 capsule (100 mg total) by mouth 3 (three) times daily as needed. 20 capsule 0  diphenhydramine-acetaminophen (TYLENOL PM) 25-500 MG TABS tablet Take 1 tablet by mouth at bedtime as needed.      ferrous sulfate 325 (65 FE) MG tablet ferrous sulfate 325 mg (65 mg iron) tablet  TAKE 1 TABLET BY MOUTH EVERY DAY      hydrocortisone 2.5 % cream Procto-Med HC 2.5 % topical cream perineal applicator  APPLY 1 APPLICATION TO AFFECTED AREA TWO TIMES DAILY      ibuprofen (ADVIL) 600 MG tablet Take 1 tablet (600 mg total) by mouth every 6 (six) hours as needed for moderate pain. 30 tablet 0    NIFEdipine (ADALAT CC) 30 MG 24 hr tablet Take 1 tablet (30 mg total) by mouth daily. 30 tablet 1    Prenatal Vit-Fe Fumarate-FA (PRENATAL VITAMIN PO) Take 1 tablet by mouth daily.       Review of Systems  Constitutional:  Negative for chills and fever.  HENT:  Negative for congestion and sore throat.   Eyes:  Negative for pain and visual disturbance.  Respiratory:  Negative for cough, chest tightness and shortness of breath.   Cardiovascular:  Negative for chest pain.  Gastrointestinal:  Negative for  abdominal pain, diarrhea, nausea and vomiting.  Endocrine: Negative for cold intolerance and heat intolerance.  Genitourinary:  Positive for vaginal bleeding. Negative for dysuria and flank pain.  Musculoskeletal:  Negative for back pain.  Skin:  Negative for rash.  Allergic/Immunologic: Negative for food allergies.  Neurological:  Negative for dizziness and light-headedness.  Psychiatric/Behavioral:  Negative for agitation.    Physical Exam   Blood pressure 120/72, pulse 69, temperature 98 F (36.7 C), temperature source Oral, resp. rate 18, height 5' 4.5" (1.638 m), weight 98.4 kg, last menstrual period 06/17/2022, SpO2 100 %, unknown if currently breastfeeding.  Physical Exam Vitals and nursing note reviewed.  Constitutional:      General: She is not in acute distress.    Appearance: She is well-developed.  HENT:     Head: Normocephalic and atraumatic.  Eyes:     General: No scleral icterus.    Conjunctiva/sclera: Conjunctivae normal.  Cardiovascular:     Rate and Rhythm: Normal rate.  Pulmonary:     Effort: Pulmonary effort is normal.  Chest:     Chest wall: No tenderness.  Abdominal:     Palpations: Abdomen is soft.     Tenderness: There is no abdominal tenderness. There is no guarding or rebound.  Genitourinary:    Vagina: Normal.  Musculoskeletal:        General: Normal range of motion.     Cervical back: Normal range of motion and neck supple.  Skin:    General: Skin is warm and dry.     Findings: No rash.  Neurological:     Mental Status: She is alert and oriented to person, place, and time.    MAU Course  Procedures MDM: moderate  This patient presents to the ED for concern of   Chief Complaint  Patient presents with   Vaginal Bleeding     These complains involves an extensive number of treatment options, and is a complaint that carries with it a high risk of complications and morbidity.  The differential diagnosis for  1.vaginal bleeding in early  pregnancy INCLUDES threatened miscarriage, ectopic pregnancy (unless IUP confirmed), normal variant bleeding with live IUP-mostly likely subchorionic hemorrhage in this case.    Co morbidities that complicate the patient evaluation: Patient Active Problem List   Diagnosis Date Noted   Previous  cesarean delivery affecting pregnancy 08/14/2022   History of gestational hypertension 11/03/2021   Ulcerative colitis (HCC) 06/30/2016   Anemia 06/30/2016   Ulcerative colitis, acute, with rectal bleeding (HCC) 06/30/2016   Obesity, unspecified 05/07/2013     External records from outside source obtained and reviewed including CareEverywhere and Prenatal care records  Lab Tests: Wet prep, BHCG, and Gonorrhea/Chlamydia   I ordered, and personally interpreted labs.  The pertinent results include:   Results for orders placed or performed during the hospital encounter of 08/14/22 (from the past 24 hour(s))  Pregnancy, urine POC     Status: Abnormal   Collection Time: 08/14/22  9:55 AM  Result Value Ref Range   Preg Test, Ur POSITIVE (A) NEGATIVE  Urinalysis, Routine w reflex microscopic -Urine, Clean Catch     Status: Abnormal   Collection Time: 08/14/22 10:20 AM  Result Value Ref Range   Color, Urine YELLOW YELLOW   APPearance HAZY (A) CLEAR   Specific Gravity, Urine 1.023 1.005 - 1.030   pH 5.0 5.0 - 8.0   Glucose, UA NEGATIVE NEGATIVE mg/dL   Hgb urine dipstick LARGE (A) NEGATIVE   Bilirubin Urine NEGATIVE NEGATIVE   Ketones, ur 20 (A) NEGATIVE mg/dL   Protein, ur 30 (A) NEGATIVE mg/dL   Nitrite NEGATIVE NEGATIVE   Leukocytes,Ua NEGATIVE NEGATIVE   RBC / HPF >50 0 - 5 RBC/hpf   WBC, UA 6-10 0 - 5 WBC/hpf   Bacteria, UA RARE (A) NONE SEEN   Squamous Epithelial / HPF 11-20 0 - 5 /HPF   Mucus PRESENT   Wet prep, genital     Status: None   Collection Time: 08/14/22 10:27 AM   Specimen: PATH Cytology Cervicovaginal Ancillary Only  Result Value Ref Range   Yeast Wet Prep HPF POC NONE  SEEN NONE SEEN   Trich, Wet Prep NONE SEEN NONE SEEN   Clue Cells Wet Prep HPF POC NONE SEEN NONE SEEN   WBC, Wet Prep HPF POC <10 <10   Sperm NONE SEEN      Imaging Studies ordered:  I ordered imaging studies includingTransvaginal [place] I independently visualized and interpreted imaging which showed Live IUP with FHR. Earlier pregnant than expected, currently 6 weeks.  I agree with the radiologist interpretation  Medicines ordered and prescription drug management:  Medications:    Reevaluation of the patient after these medicines showed that the patient improved I have reviewed the patients home medicines and have made adjustments as needed  MAU Course: Called patient from lobby to review results but patient had left.   After the interventions noted above, I reevaluated the patient and found that they have :improved  Dispostion: discharged   Assessment and Plan   1. Viable pregnancy in first trimester   2. Bleeding in early pregnancy   3. Less than [redacted] weeks gestation of pregnancy   4. [redacted] weeks gestation of pregnancy    Patient left without reviewing her results   Federico Flake 08/14/2022, 11:49 AM

## 2022-08-14 NOTE — MAU Provider Note (Signed)
History     [id number]  Arrival date and time: 08/14/22 2224    Chief Complaint  Patient presents with   Vaginal Bleeding     HPI Robin Mora is a 32 y.o. G2P1 at [redacted]w[redacted]d by LMP and confirmed by [place] completed this am in MAU.  She notes that she had to leave earlier today before she was able to talk to the provider and did not know the results of the [place].  Additionally, the bleeding has picked up and instead of one spot, she had blood on her pad.  She has only used one pad.  She also notes some lower bilateral groin pain.  She notes that in her prior pregnancy, she did not have any of these symptoms.  She also notes some nausea, no vomiting.  Previously taking Diclegis and wishes to restart this medication.  Past Medical History:  Diagnosis Date   ADHD    Anemia    Anxiety    Depression    Endometriosis    GERD (gastroesophageal reflux disease)    Large breasts 05/07/2013   Ovarian cyst    Ulcerative colitis (HCC)    UTI (urinary tract infection)     Past Surgical History:  Procedure Laterality Date   CESAREAN SECTION N/A 10/31/2021   Procedure: CESAREAN SECTION;  Surgeon: Jaymes Graff, MD;  Location: [facility];  Service: Obstetrics;  Laterality: N/A;   HEMORRHOID BANDING     THERAPEUTIC ABORTION     x3    Family History  Problem Relation Age of Onset   Hyperlipidemia Mother    Diabetes Father    Hyperlipidemia Father    Hypertension Father    Sleep apnea Father    Diabetes Maternal Grandmother    Diabetes Paternal Grandmother    Hypertension Paternal Grandmother    Heart disease Paternal Grandfather    Hypertension Paternal Grandfather    Kidney disease Paternal Grandfather    Diabetes Paternal Grandfather     No Known Allergies  No current facility-administered medications on file prior to encounter.   Current Outpatient Medications on File Prior to Encounter  Medication Sig Dispense Refill   acetaminophen (TYLENOL) 325 MG tablet Take 2 tablets  (650 mg total) by mouth every 6 (six) hours as needed for mild pain (temperature > 101.5.).     diphenhydramine-acetaminophen (TYLENOL PM) 25-500 MG TABS tablet Take 1 tablet by mouth at bedtime as needed.     ferrous sulfate 325 (65 FE) MG tablet ferrous sulfate 325 mg (65 mg iron) tablet  TAKE 1 TABLET BY MOUTH EVERY DAY     hydrocortisone 2.5 % cream Procto-Med HC 2.5 % topical cream perineal applicator  APPLY 1 APPLICATION TO AFFECTED AREA TWO TIMES DAILY     acetaminophen (TYLENOL) 325 MG tablet Take 650 mg by mouth every 6 (six) hours as needed for mild pain.     Adalimumab (HUMIRA) 40 MG/0.4ML PSKT Inject 40 mg into the skin once a week. Every other week     benzonatate (TESSALON) 100 MG capsule Take 1 capsule (100 mg total) by mouth 3 (three) times daily as needed. 20 capsule 0   ibuprofen (ADVIL) 600 MG tablet Take 1 tablet (600 mg total) by mouth every 6 (six) hours as needed for moderate pain. 30 tablet 0   NIFEdipine (ADALAT CC) 30 MG 24 hr tablet Take 1 tablet (30 mg total) by mouth daily. 30 tablet 1   Prenatal Vit-Fe Fumarate-FA (PRENATAL VITAMIN PO) Take 1  tablet by mouth daily. Plans to start taking but has not purchased any yet       Review of Systems  Constitutional: Negative.   HENT: Negative.    Eyes: Negative.   Respiratory: Negative.    Cardiovascular: Negative.   Genitourinary: Negative.   Musculoskeletal: Negative.   Skin: Negative.   Neurological: Negative.   Endo/Heme/Allergies: Negative.   Psychiatric/Behavioral: Negative.     Pertinent positives and negative per HPI, all others reviewed and negative  Physical Exam   BP 128/81   Pulse (!) 58   Temp 97.6 F (36.4 C)   Resp 18   Ht 5\' 4"  (1.626 m)   Wt 98.4 kg   LMP 06/17/2022   SpO2 100%   BMI 37.25 kg/m   Patient Vitals for the past 24 hrs:  BP Temp Pulse Resp SpO2 Height Weight  08/14/22 2251 128/81 -- -- -- -- -- --  08/14/22 2246 -- 97.6 F (36.4 C) (!) 58 18 100 % 5\' 4"  (1.626 m) 98.4 kg     Physical Exam Vitals reviewed.  Constitutional:      Appearance: Normal appearance.  HENT:     Head: Normocephalic.  Cardiovascular:     Rate and Rhythm: Normal rate and regular rhythm.  Pulmonary:     Effort: Pulmonary effort is normal.     Breath sounds: Normal breath sounds.  Abdominal:     Palpations: Abdomen is soft.     Comments: Soft and non-tender, no rebound, no guarding  Genitourinary:    Comments: Normal external genitalia, vaginal pink mucosa, minimal spotting of bright red blood noted. Cervix appears closed- no active bleeding noted. Musculoskeletal:     Cervical back: Neck supple.  Skin:    General: Skin is warm and dry.  Neurological:     General: No focal deficit present.     Mental Status: She is alert and oriented to person, place, and time.  Psychiatric:        Mood and Affect: Mood normal.        Behavior: Behavior normal.        Thought Content: Thought content normal.        Judgment: Judgment normal.          Labs Results for orders placed or performed during the hospital encounter of 08/14/22 (from the past 24 hour(s))  Pregnancy, urine POC     Status: Abnormal   Collection Time: 08/14/22  9:55 AM  Result Value Ref Range   Preg Test, Ur POSITIVE (A) NEGATIVE  hCG, quantitative, pregnancy     Status: Abnormal   Collection Time: 08/14/22 10:19 AM  Result Value Ref Range   hCG, Beta Chain, Quant, S 24,984 (H) <5 mIU/mL  Urinalysis, Routine w reflex microscopic -Urine, Clean Catch     Status: Abnormal   Collection Time: 08/14/22 10:20 AM  Result Value Ref Range   Color, Urine YELLOW YELLOW   APPearance HAZY (A) CLEAR   Specific Gravity, Urine 1.023 1.005 - 1.030   pH 5.0 5.0 - 8.0   Glucose, UA NEGATIVE NEGATIVE mg/dL   Hgb urine dipstick LARGE (A) NEGATIVE   Bilirubin Urine NEGATIVE NEGATIVE   Ketones, ur 20 (A) NEGATIVE mg/dL   Protein, ur 30 (A) NEGATIVE mg/dL   Nitrite NEGATIVE NEGATIVE   Leukocytes,Ua NEGATIVE NEGATIVE   RBC /  HPF >50 0 - 5 RBC/hpf   WBC, UA 6-10 0 - 5 WBC/hpf   Bacteria, UA RARE (A) NONE SEEN  Squamous Epithelial / HPF 11-20 0 - 5 /HPF   Mucus PRESENT   Wet prep, genital     Status: None   Collection Time: 08/14/22 10:27 AM   Specimen: PATH Cytology Cervicovaginal Ancillary Only  Result Value Ref Range   Yeast Wet Prep HPF POC NONE SEEN NONE SEEN   Trich, Wet Prep NONE SEEN NONE SEEN   Clue Cells Wet Prep HPF POC NONE SEEN NONE SEEN   WBC, Wet Prep HPF POC <10 <10   Sperm NONE SEEN     Imaging US OB LESS THAN 14 WEEKS WITH OB TRANSVAGINAL  Result Date: 08/14/2022 CLINICAL DATA:  Vaginal spotting EXAM: OBSTETRIC <14 WK [place] AND TRANSVAGINAL OB US TECHNIQUE: Both transabdominal and transvaginal ultrasound examinations were performed for complete evaluation of the gestation as well as the maternal uterus, adnexal regions, and pelvic cul-de-sac. Transvaginal technique was performed to assess early pregnancy. COMPARISON:  None Available. FINDINGS: Intrauterine gestational sac: Single Yolk sac:  Visualized. Embryo:  Visualized. Cardiac Activity: Visualized. Heart Rate: 124 bpm MSD: N/A CRL:  7.6 mm   6 w   5 d                  [place] EDC: 04/04/2023 Subchorionic hemorrhage:  None visualized. Maternal uterus/adnexae: Retroflexed uterus. Normal ovaries. Trace pelvic free fluid. IMPRESSION: Single viable intrauterine pregnancy at 6 weeks 5 days' gestation by crown-rump length. Electronically Signed   By: Agustin Cree M.D.   On: 08/14/2022 11:33    MAU Course  Procedures Lab Orders  No laboratory test(s) ordered today   Meds ordered this encounter  Medications   Doxylamine-Pyridoxine 10-10 MG TBEC    Sig: Take 2 tablets by mouth at bedtime.    Dispense:  60 tablet    Refill:  3     MDM -lab work reviewed -Independently reviewed [place]- confirmed IUP with FHT -examination completed- no acute abnormalities Assessment and Plan   1. Previous cesarean delivery affecting pregnancy   2. Vaginal bleeding in  pregnancy, first trimester   3. Nausea and vomiting in pregnancy    -reviewed [place] with patient and reassured pt of viable IUP -Diclegis sent in  -reviewed spotting in pregnancy and miscarriage precautions -questions/concerns were addressed -pt has follow up scheduled with [facility] mid-July- f/u as scheduled -discharge home in stable condition   Sharon Seller, DO 08/14/22 11:42 PM

## 2022-08-15 ENCOUNTER — Encounter (HOSPITAL_COMMUNITY): Payer: Self-pay | Admitting: Obstetrics and Gynecology

## 2022-08-15 ENCOUNTER — Inpatient Hospital Stay (HOSPITAL_COMMUNITY): Payer: BC Managed Care – PPO

## 2022-08-15 ENCOUNTER — Inpatient Hospital Stay (HOSPITAL_COMMUNITY)
Admission: AD | Admit: 2022-08-15 | Discharge: 2022-08-15 | Disposition: A | Discharge status: Home / Self Care | Payer: BC Managed Care – PPO | Attending: Obstetrics and Gynecology | Admitting: Obstetrics and Gynecology

## 2022-08-15 DIAGNOSIS — O34219 Maternal care for unspecified type scar from previous cesarean delivery: Secondary | ICD-10-CM

## 2022-08-15 DIAGNOSIS — O99611 Diseases of the digestive system complicating pregnancy, first trimester: Secondary | ICD-10-CM | POA: Diagnosis not present

## 2022-08-15 DIAGNOSIS — Z3A01 Less than 8 weeks gestation of pregnancy: Secondary | ICD-10-CM | POA: Diagnosis not present

## 2022-08-15 DIAGNOSIS — O209 Hemorrhage in early pregnancy, unspecified: Secondary | ICD-10-CM | POA: Diagnosis present

## 2022-08-15 DIAGNOSIS — O039 Complete or unspecified spontaneous abortion without complication: Secondary | ICD-10-CM | POA: Diagnosis not present

## 2022-08-15 DIAGNOSIS — K219 Gastro-esophageal reflux disease without esophagitis: Secondary | ICD-10-CM | POA: Insufficient documentation

## 2022-08-15 LAB — GC/CHLAMYDIA PROBE AMP (~~LOC~~) NOT AT ARMC
Chlamydia: NEGATIVE
Comment: NEGATIVE
Comment: NORMAL
Neisseria Gonorrhea: NEGATIVE

## 2022-08-15 NOTE — MAU Provider Note (Signed)
History     [id number]  Arrival date and time: 08/15/22 1737    Chief Complaint  Patient presents with   Vaginal Bleeding     HPI Robin Mora is a 32 y.o. at [redacted]w[redacted]d by [place] with PMHx notable for one prior cesarean, who presents for vaginal bleeding.   Patient seen in MAU twice yesterday for vaginal bleeding and cramping US showed viable IUP, discharged home in stable condition Reports that today she has had ongoing bleeding that has become heavier and passed a large clot Ongoing cramping No burning or pain with urination No fevers No vaginal discharge or odor   --/--/B POS (09/26 0433)  OB History     Gravida  5   Para  1   Term  1   Preterm      AB  3   Living  1      SAB      IAB      Ectopic      Multiple  0   Live Births  1        Obstetric Comments  Procedure ab x3         Past Medical History:  Diagnosis Date   ADHD    Anemia    Anxiety    Depression    Endometriosis    GERD (gastroesophageal reflux disease)    Large breasts 05/07/2013   Ovarian cyst    Ulcerative colitis (HCC)    UTI (urinary tract infection)     Past Surgical History:  Procedure Laterality Date   CESAREAN SECTION N/A 10/31/2021   Procedure: CESAREAN SECTION;  Surgeon: Jaymes Graff, MD;  Location: [facility];  Service: Obstetrics;  Laterality: N/A;   HEMORRHOID BANDING     THERAPEUTIC ABORTION     x3    Family History  Problem Relation Age of Onset   Hyperlipidemia Mother    Diabetes Father    Hyperlipidemia Father    Hypertension Father    Sleep apnea Father    Diabetes Maternal Grandmother    Diabetes Paternal Grandmother    Hypertension Paternal Grandmother    Heart disease Paternal Grandfather    Hypertension Paternal Grandfather    Kidney disease Paternal Grandfather    Diabetes Paternal Grandfather     Social History   Socioeconomic History   Marital status: Single    Spouse name: n/a   Number of children: 0   Years of  education: college   Highest education level: Not on file  Occupational History   Occupation: Runner, broadcasting/film/video    Comment: Administrator  Tobacco Use   Smoking status: Never   Smokeless tobacco: Never  Vaping Use   Vaping Use: Never used  Substance and Sexual Activity   Alcohol use: Not Currently    Comment: occ   Drug use: Not Currently    Frequency: 3.0 times per week    Types: Marijuana    Comment: pt states the last time was 2-3 weeks ago   Sexual activity: Yes    Partners: Male  Other Topics Concern   Not on file  Social History Narrative   In graduate program at [employer] and anticipates a Master's Degree in [place] and African American Literature 06/2015. Her undergraduate degree in [employer] is from [employer].   Lives with her sister.   Social Determinants of Health   Financial Resource Strain: Not on file  Food Insecurity: No Food Insecurity (10/31/2021)   Hunger Vital Sign  Worried About Programme researcher, broadcasting/film/video in the Last Year: Never true    Ran Out of Food in the Last Year: Never true  Transportation Needs: No Transportation Needs (10/31/2021)   PRAPARE - Administrator, Civil Service (Medical): No    Lack of Transportation (Non-Medical): No  Physical Activity: Not on file  Stress: Not on file  Social Connections: Not on file  Intimate Partner Violence: Not At Risk (10/31/2021)   Humiliation, Afraid, Rape, and Kick questionnaire    Fear of Current or Ex-Partner: No    Emotionally Abused: No    Physically Abused: No    Sexually Abused: No    No Known Allergies  No current facility-administered medications on file prior to encounter.   Current Outpatient Medications on File Prior to Encounter  Medication Sig Dispense Refill   ferrous sulfate 325 (65 FE) MG tablet ferrous sulfate 325 mg (65 mg iron) tablet  TAKE 1 TABLET BY MOUTH EVERY DAY     hydrocortisone 2.5 % cream Procto-Med HC 2.5 % topical cream perineal applicator  APPLY 1 APPLICATION TO  AFFECTED AREA TWO TIMES DAILY     acetaminophen (TYLENOL) 325 MG tablet Take 650 mg by mouth every 6 (six) hours as needed for mild pain.     Doxylamine-Pyridoxine 10-10 MG TBEC Take 2 tablets by mouth at bedtime. 60 tablet 3   Prenatal Vit-Fe Fumarate-FA (PRENATAL VITAMIN PO) Take 1 tablet by mouth daily. Plans to start taking but has not purchased any yet       ROS Pertinent positives and negative per HPI, all others reviewed and negative  Physical Exam   BP (!) 115/55   Pulse 69   Temp 97.9 F (36.6 C)   Resp 18   Ht 5\' 4"  (1.626 m)   Wt 98 kg   LMP 06/17/2022   BMI 37.08 kg/m   Patient Vitals for the past 24 hrs:  BP Temp Pulse Resp Height Weight  08/15/22 1805 (!) 115/55 -- 69 -- -- --  08/15/22 1751 112/66 97.9 F (36.6 C) 79 18 5\' 4"  (1.626 m) 98 kg    Physical Exam Vitals reviewed.  Constitutional:      General: She is not in acute distress.    Appearance: She is well-developed. She is not diaphoretic.  Eyes:     General: No scleral icterus. Pulmonary:     Effort: Pulmonary effort is normal. No respiratory distress.  Abdominal:     General: There is no distension.     Palpations: Abdomen is soft.     Tenderness: There is no abdominal tenderness. There is no guarding or rebound.  Skin:    General: Skin is warm and dry.  Neurological:     Mental Status: She is alert.     Coordination: Coordination normal.      Cervical Exam    Bedside Ultrasound Pt informed that the ultrasound is considered a limited OB ultrasound and is not intended to be a complete ultrasound exam.  Patient also informed that the ultrasound is not being completed with the intent of assessing for fetal or placental anomalies or any pelvic abnormalities.  Explained that the purpose of today's ultrasound is to assess for  viability.  Patient acknowledges the purpose of the exam and the limitations of the study.      My interpretation: First trimester findings: no definite intrauterine  structures seen.    Labs No results found for this or any previous visit (from  the past 24 hour(s)).  Imaging US OB Transvaginal  Result Date: 08/15/2022 CLINICAL DATA:  Vaginal bleeding in pregnancy EXAM: TRANSVAGINAL OB ULTRASOUND TECHNIQUE: Transvaginal ultrasound was performed for complete evaluation of the gestation as well as the maternal uterus, adnexal regions, and pelvic cul-de-sac. COMPARISON:  10/15/2022 FINDINGS: Intrauterine gestational sac: None Yolk sac:  Not seen Embryo:  Not seen Cardiac Activity: Not seen Subchorionic hemorrhage:  None visualized. Maternal uterus/adnexae: Gestational sac within the fundus of the uterus seen in the examination of 08/14/2022 is not evident in the current study. Endometrial stripe measures up to 2 cm in diameter without demonstrable intrauterine gestational sac. Uterus is retroverted. Small amount of free fluid is seen in pelvis. There are no adnexal masses. IMPRESSION: There is no demonstrable intrauterine gestational sac suggesting failed gestation with interval passage of gestational sac since 08/14/2022. Endometrial stripe is prominent in size with no increased vascularity. There are no adnexal masses. Small amount of free fluid is seen in pelvis. Electronically Signed   By: Ernie Avena M.D.   On: 08/15/2022 19:15    MAU Course  Procedures Lab Orders  No laboratory test(s) ordered today   No orders of the defined types were placed in this encounter.  Imaging Orders         US OB Transvaginal     MDM Moderate (Level 3-4)  Assessment and Plan  #Spontaneous miscarriage #[redacted] weeks gestation Reassured patient that miscarriage is common with ~1/4 of women experiencing it in their lifetime. Reassured patient that there is nothing she did or did not do to cause this. Reviewed most common reason is presumed to be genetic abnormalities that allow a pregnancy to start but not continue past an early stage, but realistically we do not know the  cause in most cases. Reviewed options of expectant, medical, or surgical management. After counseling she elected for expectant management. Reviewed that cramping, bleeding are normal. Reviewed warning signs of heavy vaginal bleeding soaking through >1 pad per hour, crescendo abdominal pain, and fever. . Blood type --/--/B POS (09/26 0433), rhogam was not indicated. Patient desires contraception, she has an upcoming visit on 08/23/2022 at [facility] and plans to discuss it further at that time. We discussed return precautions including crescendo abdominal pain, heavy vaginal bleeding soaking >1 pad/hour, and fever.   Dispo: discharged to home in stable condition    Venora Maples, MD/MPH 08/15/22 7:40 PM  Allergies as of 08/15/2022   No Known Allergies      Medication List     TAKE these medications    acetaminophen 325 MG tablet Commonly known as: TYLENOL Take 650 mg by mouth every 6 (six) hours as needed for mild pain.   Doxylamine-Pyridoxine 10-10 MG Tbec Take 2 tablets by mouth at bedtime.   ferrous sulfate 325 (65 FE) MG tablet ferrous sulfate 325 mg (65 mg iron) tablet  TAKE 1 TABLET BY MOUTH EVERY DAY   hydrocortisone 2.5 % cream Procto-Med HC 2.5 % topical cream perineal applicator  APPLY 1 APPLICATION TO AFFECTED AREA TWO TIMES DAILY   PRENATAL VITAMIN PO Take 1 tablet by mouth daily. Plans to start taking but has not purchased any yet

## 2022-08-15 NOTE — Progress Notes (Signed)
WRitten and verbal d/c instructions given and understanding voiced.  

## 2022-08-15 NOTE — Discharge Instructions (Signed)
We discussed that miscarriage is common with ~1/4 of women experiencing it in their lifetime. We discussed that there is nothing you did or did not do to cause this. We reviewed most common reason is presumed to be genetic abnormalities that allow a pregnancy to start but not continue past an early stage, but realistically we do not know the cause in most cases. We reviewed that studies show no definite difference between attempting another pregnancy sooner vs waiting, though some studies do show better live birth outcomes with trying sooner. We reviewed options of expectant, medical, or surgical management. After counseling you elected for expectant management. We reviewed that cramping, bleeding are normal. We reviewed warning signs of heavy vaginal bleeding soaking through >1 pad per hour, crescendo abdominal pain, and fever. . We discussed return precautions including crescendo abdominal pain, heavy vaginal bleeding soaking >1 pad/hour, and fever

## 2022-08-15 NOTE — MAU Note (Signed)
.  Robin Mora is a 32 y.o. at [redacted]w[redacted]d here in MAU reporting: reports vag bleeding is heavier than it was yesterdy. Pasted a long clot  a few minutes ago. Had put on a pad before she got to MAU and stated it feels like it is full again.  Reports abd cramping  has taken tylenol about 3 hrs ago and has not helped much.  LMP:  Onset of complaint: yesterday Pain score: 8 Vitals:   08/15/22 1751  BP: 112/66  Pulse: 79  Resp: 18  Temp: 97.9 F (36.6 C)     FHT: Lab orders placed from triage:

## 2023-01-15 ENCOUNTER — Other Ambulatory Visit: Payer: Self-pay

## 2023-01-15 ENCOUNTER — Emergency Department (HOSPITAL_COMMUNITY)
Admission: EM | Admit: 2023-01-15 | Discharge: 2023-01-16 | Disposition: A | Discharge status: Home / Self Care | Payer: BC Managed Care – PPO | Attending: Emergency Medicine | Admitting: Emergency Medicine

## 2023-01-15 ENCOUNTER — Encounter (HOSPITAL_COMMUNITY): Payer: Self-pay

## 2023-01-15 DIAGNOSIS — L0291 Cutaneous abscess, unspecified: Secondary | ICD-10-CM

## 2023-01-15 DIAGNOSIS — L02214 Cutaneous abscess of groin: Secondary | ICD-10-CM | POA: Insufficient documentation

## 2023-01-15 NOTE — ED Triage Notes (Signed)
Pt states that she has a boil on her left labia x 3 weeks. Pt states that it is becoming more painful.

## 2023-01-16 DIAGNOSIS — L02214 Cutaneous abscess of groin: Secondary | ICD-10-CM | POA: Diagnosis not present

## 2023-01-16 MED ORDER — LIDOCAINE HCL (PF) 1 % IJ SOLN
5.0000 mL | Freq: Once | INTRAMUSCULAR | Status: AC
  Administered 2023-01-16: 5 mL via INTRADERMAL
  Filled 2023-01-16: qty 30

## 2023-01-16 MED ORDER — DOXYCYCLINE HYCLATE 100 MG PO TABS
100.0000 mg | ORAL_TABLET | Freq: Once | ORAL | Status: AC
  Administered 2023-01-16: 100 mg via ORAL
  Filled 2023-01-16: qty 1

## 2023-01-16 NOTE — ED Provider Notes (Signed)
[place] EMERGENCY DEPARTMENT AT [facility] Provider Note   CSN: [id number] Arrival date & time: 01/15/23  2219     History  Chief Complaint  Patient presents with   Abscess    Merryl Christol Razvi is a 32 y.o. female.  The history is provided by the patient and medical records.  Abscess  32 year old female with history of ulcerative colitis, GERD, depression, anemia, presenting to the ED for abscess to her left groin.  States this has been there for about 3 weeks but has become increasingly more painful recently.  She was seen at urgent care 2 days ago and given abx but has not picked it up yet.  She has an appointment with her dermatologist tomorrow to have this addressed but did not want to wait.  She has not shaved recently and has not had bikini wax in about 2 months.  Home Medications Prior to Admission medications   Medication Sig Start Date End Date Taking? Authorizing Provider  acetaminophen (TYLENOL) 325 MG tablet Take 650 mg by mouth every 6 (six) hours as needed for mild pain.    [provider]  ferrous sulfate 325 (65 FE) MG tablet ferrous sulfate 325 mg (65 mg iron) tablet  TAKE 1 TABLET BY MOUTH EVERY DAY    [provider]  hydrocortisone 2.5 % cream Procto-Med HC 2.5 % topical cream perineal applicator  APPLY 1 APPLICATION TO AFFECTED AREA TWO TIMES DAILY    [provider]  Prenatal Vit-Fe Fumarate-FA (PRENATAL VITAMIN PO) Take 1 tablet by mouth daily. Plans to start taking but has not purchased any yet    [provider]      Allergies    Patient has no known allergies.    Review of Systems   Review of Systems  Skin:        Abscess  All other systems reviewed and are negative.   Physical Exam Updated Vital Signs BP 117/74 (BP Location: Right Arm)   Pulse 75   Temp 97.9 F (36.6 C) (Oral)   Resp 16   Ht 5' 4.5" (1.638 m)   Wt 88.9 kg   LMP 06/17/2022   SpO2 100%   BMI 33.12 kg/m    Physical Exam Vitals and nursing note reviewed.  Constitutional:      Appearance: She is well-developed.  HENT:     Head: Normocephalic and atraumatic.  Eyes:     Conjunctiva/sclera: Conjunctivae normal.     Pupils: Pupils are equal, round, and reactive to light.  Cardiovascular:     Rate and Rhythm: Normal rate and regular rhythm.     Heart sounds: Normal heart sounds.  Pulmonary:     Effort: Pulmonary effort is normal.     Breath sounds: Normal breath sounds.  Abdominal:     General: Bowel sounds are normal.     Palpations: Abdomen is soft.  Genitourinary:    Comments: Abscess noted to superior margin of left labia/lower Raynelle Fanning, area is quite firm and tender to the touch with small area of fluctuance towards the groin fold, no active drainage/bleeding Musculoskeletal:        General: Normal range of motion.     Cervical back: Normal range of motion.  Skin:    General: Skin is warm and dry.  Neurological:     Mental Status: She is alert and oriented to person, place, and time.     ED Results / Procedures / Treatments   Labs (all  labs ordered are listed, but only abnormal results are displayed) Labs Reviewed - No data to display  EKG None  Radiology No results found.  Procedures Procedures    INCISION AND DRAINAGE Performed by: Garlon Hatchet Consent: Verbal consent obtained. Risks and benefits: risks, benefits and alternatives were discussed Type: abscess  Body area: left groin  Anesthesia: local infiltration  Incision was made with a scalpel.  Local anesthetic: lidocaine 1% without epinephrine  Anesthetic total: 2 ml  Complexity: complex Blunt dissection to break up loculations  Drainage: purulent  Drainage amount: small  Packing material: none  Patient tolerance: Patient tolerated the procedure well with no immediate complications.    Medications Ordered in ED Medications  lidocaine (PF) (XYLOCAINE) 1 % injection 5 mL (5 mLs Intradermal  Given by Other 01/16/23 0246)  doxycycline (VIBRA-TABS) tablet 100 mg (100 mg Oral Given 01/16/23 0250)    ED Course/ Medical Decision Making/ A&P                                 Medical Decision Making Risk Prescription drug management.   32 year old female presenting to the ED with abscess to left groin.  Ongoing for about 3 weeks but for painful now.  She was given prescription for doxycycline 2 days ago but has not yet picked it up.  Came in as area was more painful.  She is afebrile and nontoxic.  She does have a abscess to left upper labia/mons area.  Small area of fluctuance laterally.  No tissue crepitus.  She is not diabetic.  Not concerning for necrotizing infectious/fournier's.  I&D performed and fair amount of purulent material expressed.  Do not feel she needs packing as area is quite small.  She has follow-up with dermatology upcoming later this week.  Will pick up her prescription for doxycycline and start this, encourage warm compresses/soaks.  Can return here for new concerns.  Final Clinical Impression(s) / ED Diagnoses Final diagnoses:  Abscess    Rx / DC Orders ED Discharge Orders     None         Garlon Hatchet, PA-C 01/16/23 0256    Nira Conn, MD 01/16/23 [id number]

## 2023-01-16 NOTE — Discharge Instructions (Signed)
Take the prescribed medication as directed.  Recommend warm compresses/soaks. Follow-up with your doctor and/or your dermatologist. Return to the ED for new or worsening symptoms.

## 2023-03-12 ENCOUNTER — Other Ambulatory Visit: Payer: Self-pay | Admitting: Obstetrics & Gynecology

## 2023-03-15 ENCOUNTER — Emergency Department (HOSPITAL_COMMUNITY)
Admission: EM | Admit: 2023-03-15 | Discharge: 2023-03-16 | Discharge status: Against Medical Advice | Payer: 59 | Attending: Emergency Medicine | Admitting: Emergency Medicine

## 2023-03-15 ENCOUNTER — Other Ambulatory Visit: Payer: Self-pay

## 2023-03-15 ENCOUNTER — Encounter (HOSPITAL_COMMUNITY): Payer: Self-pay | Admitting: *Deleted

## 2023-03-15 DIAGNOSIS — Z5321 Procedure and treatment not carried out due to patient leaving prior to being seen by health care provider: Secondary | ICD-10-CM | POA: Insufficient documentation

## 2023-03-15 DIAGNOSIS — Z48817 Encounter for surgical aftercare following surgery on the skin and subcutaneous tissue: Secondary | ICD-10-CM | POA: Diagnosis present

## 2023-03-15 LAB — CBC WITH DIFFERENTIAL/PLATELET
Abs Immature Granulocytes: 0.01 10*3/uL (ref 0.00–0.07)
Basophils Absolute: 0.1 10*3/uL (ref 0.0–0.1)
Basophils Relative: 2 %
Eosinophils Absolute: 0.2 10*3/uL (ref 0.0–0.5)
Eosinophils Relative: 4 %
HCT: 36.5 % (ref 36.0–46.0)
Hemoglobin: 11.4 g/dL — ABNORMAL LOW (ref 12.0–15.0)
Immature Granulocytes: 0 %
Lymphocytes Relative: 46 %
Lymphs Abs: 2.5 10*3/uL (ref 0.7–4.0)
MCH: 27.1 pg (ref 26.0–34.0)
MCHC: 31.2 g/dL (ref 30.0–36.0)
MCV: 86.7 fL (ref 80.0–100.0)
Monocytes Absolute: 0.5 10*3/uL (ref 0.1–1.0)
Monocytes Relative: 10 %
Neutro Abs: 2.1 10*3/uL (ref 1.7–7.7)
Neutrophils Relative %: 38 %
Platelets: 393 10*3/uL (ref 150–400)
RBC: 4.21 MIL/uL (ref 3.87–5.11)
RDW: 14.9 % (ref 11.5–15.5)
WBC: 5.4 10*3/uL (ref 4.0–10.5)
nRBC: 0 % (ref 0.0–0.2)

## 2023-03-15 LAB — BASIC METABOLIC PANEL
Anion gap: 10 (ref 5–15)
BUN: 12 mg/dL (ref 6–20)
CO2: 23 mmol/L (ref 22–32)
Calcium: 9.1 mg/dL (ref 8.9–10.3)
Chloride: 104 mmol/L (ref 98–111)
Creatinine, Ser: 0.6 mg/dL (ref 0.44–1.00)
GFR, Estimated: >60 mL/min (ref >60)
Glucose, Bld: 87 mg/dL (ref 70–99)
Potassium: 3.6 mmol/L (ref 3.5–5.1)
Sodium: 137 mmol/L (ref 135–145)

## 2023-03-15 NOTE — ED Provider Triage Note (Signed)
 Emergency Medicine Provider Triage Evaluation Note  Britiney Blahnik , a 33 y.o. female  was evaluated in triage.  Pt complains of wound dehiscence.  Review of Systems  Positive: Bloody/purulent discharge, pain Negative: Fever, chills  Physical Exam  BP (!) 142/81   Pulse 70   Temp 97.9 F (36.6 C)   Resp 16   LMP 06/17/2022   SpO2 98%  Gen:   Awake, no distress   Resp:  Normal effort  MSK:   Moves extremities without difficulty  Other:  Small approximately 2 cm incision site open with minimal purulent/bloody discharge  Medical Decision Making  Medically screening exam initiated at 9:02 PM.  Appropriate orders placed.  Ebelyn Normagene Harvie was informed that the remainder of the evaluation will be completed by another provider, this initial triage assessment does not replace that evaluation, and the importance of remaining in the ED until their evaluation is complete.  Labs ordered   Francis Ileana SAILOR, DEVONNA 03/15/23 2103

## 2023-03-15 NOTE — ED Triage Notes (Signed)
 The pt had a mass removed Tuesday  she has had a re-opened area  from her lt groin  she reports that the area has been draining since it was opened tuesday

## 2023-03-16 NOTE — ED Notes (Signed)
 No answer for room assignment

## 2023-03-18 LAB — SURGICAL PATHOLOGY
